# Patient Record
Sex: Female | Born: 1970 | Race: White | Hispanic: No | Smoking: Current every day smoker
Health system: Southern US, Community
[De-identification: ages and names within clinical notes are randomized; demographics above are authoritative.]

## PROBLEM LIST (undated history)

## (undated) DIAGNOSIS — D638 Anemia in other chronic diseases classified elsewhere: Secondary | ICD-10-CM

## (undated) DIAGNOSIS — K589 Irritable bowel syndrome without diarrhea: Secondary | ICD-10-CM

## (undated) DIAGNOSIS — IMO0002 Reserved for concepts with insufficient information to code with codable children: Secondary | ICD-10-CM

## (undated) DIAGNOSIS — F329 Major depressive disorder, single episode, unspecified: Secondary | ICD-10-CM

## (undated) DIAGNOSIS — B379 Candidiasis, unspecified: Secondary | ICD-10-CM

## (undated) DIAGNOSIS — F1021 Alcohol dependence, in remission: Secondary | ICD-10-CM

## (undated) DIAGNOSIS — M549 Dorsalgia, unspecified: Secondary | ICD-10-CM

## (undated) DIAGNOSIS — F952 Tourette's disorder: Secondary | ICD-10-CM

## (undated) DIAGNOSIS — Z9189 Other specified personal risk factors, not elsewhere classified: Secondary | ICD-10-CM

## (undated) DIAGNOSIS — F3289 Other specified depressive episodes: Secondary | ICD-10-CM

## (undated) DIAGNOSIS — G4700 Insomnia, unspecified: Secondary | ICD-10-CM

## (undated) DIAGNOSIS — J13 Pneumonia due to Streptococcus pneumoniae: Secondary | ICD-10-CM

## (undated) DIAGNOSIS — N289 Disorder of kidney and ureter, unspecified: Secondary | ICD-10-CM

## (undated) DIAGNOSIS — K219 Gastro-esophageal reflux disease without esophagitis: Secondary | ICD-10-CM

## (undated) DIAGNOSIS — K648 Other hemorrhoids: Secondary | ICD-10-CM

## (undated) DIAGNOSIS — E8941 Symptomatic postprocedural ovarian failure: Secondary | ICD-10-CM

## (undated) DIAGNOSIS — N200 Calculus of kidney: Secondary | ICD-10-CM

## (undated) DIAGNOSIS — Z8614 Personal history of Methicillin resistant Staphylococcus aureus infection: Secondary | ICD-10-CM

## (undated) DIAGNOSIS — H548 Legal blindness, as defined in USA: Secondary | ICD-10-CM

## (undated) DIAGNOSIS — Z9889 Other specified postprocedural states: Secondary | ICD-10-CM

## (undated) DIAGNOSIS — J449 Chronic obstructive pulmonary disease, unspecified: Secondary | ICD-10-CM

## (undated) DIAGNOSIS — K279 Peptic ulcer, site unspecified, unspecified as acute or chronic, without hemorrhage or perforation: Secondary | ICD-10-CM

## (undated) DIAGNOSIS — I2510 Atherosclerotic heart disease of native coronary artery without angina pectoris: Secondary | ICD-10-CM

## (undated) DIAGNOSIS — N23 Unspecified renal colic: Secondary | ICD-10-CM

## (undated) HISTORY — DX: Insomnia, unspecified: G47.00

## (undated) HISTORY — DX: Calculus of kidney: N20.0

## (undated) HISTORY — DX: Alcohol dependence, in remission: F10.21

## (undated) HISTORY — DX: Legal blindness, as defined in USA: H54.8

## (undated) HISTORY — PX: OTHER SURGICAL HISTORY: SHX169

## (undated) HISTORY — DX: Pneumonia due to Streptococcus pneumoniae: J13

## (undated) HISTORY — DX: Irritable bowel syndrome, unspecified: K58.9

## (undated) HISTORY — DX: Other hemorrhoids: K64.8

## (undated) HISTORY — DX: Symptomatic postprocedural ovarian failure: E89.41

## (undated) HISTORY — PX: ABDOMINAL HYSTERECTOMY: SHX81

## (undated) HISTORY — DX: Other specified postprocedural states: Z98.89

## (undated) HISTORY — DX: Reserved for concepts with insufficient information to code with codable children: IMO0002

## (undated) HISTORY — DX: Gastro-esophageal reflux disease without esophagitis: K21.9

## (undated) HISTORY — DX: Anemia in other chronic diseases classified elsewhere: D63.8

## (undated) HISTORY — DX: Other specified personal risk factors, not elsewhere classified: Z91.89

## (undated) HISTORY — DX: Personal history of Methicillin resistant Staphylococcus aureus infection: Z86.14

## (undated) HISTORY — DX: Dorsalgia, unspecified: M54.9

## (undated) HISTORY — DX: Major depressive disorder, single episode, unspecified: F32.9

## (undated) HISTORY — DX: Candidiasis, unspecified: B37.9

## (undated) HISTORY — DX: Tourette's disorder: F95.2

## (undated) HISTORY — DX: Other specified depressive episodes: F32.89

## (undated) HISTORY — DX: Peptic ulcer, site unspecified, unspecified as acute or chronic, without hemorrhage or perforation: K27.9

## (undated) HISTORY — DX: Unspecified renal colic: N23

## (undated) HISTORY — PX: EYE SURGERY: SHX253

---

## 1998-02-02 ENCOUNTER — Emergency Department (HOSPITAL_COMMUNITY): Admission: EM | Admit: 1998-02-02 | Discharge: 1998-02-02 | Payer: Self-pay | Admitting: Emergency Medicine

## 2001-06-03 ENCOUNTER — Emergency Department (HOSPITAL_COMMUNITY): Admission: EM | Admit: 2001-06-03 | Discharge: 2001-06-03 | Payer: Self-pay | Admitting: Emergency Medicine

## 2001-06-20 ENCOUNTER — Emergency Department (HOSPITAL_COMMUNITY): Admission: EM | Admit: 2001-06-20 | Discharge: 2001-06-20 | Payer: Self-pay | Admitting: Emergency Medicine

## 2001-11-27 ENCOUNTER — Emergency Department (HOSPITAL_COMMUNITY): Admission: EM | Admit: 2001-11-27 | Discharge: 2001-11-27 | Payer: Self-pay | Admitting: Emergency Medicine

## 2001-12-26 ENCOUNTER — Emergency Department (HOSPITAL_COMMUNITY): Admission: EM | Admit: 2001-12-26 | Discharge: 2001-12-26 | Payer: Self-pay | Admitting: Podiatry

## 2001-12-26 ENCOUNTER — Encounter: Payer: Self-pay | Admitting: *Deleted

## 2002-01-05 ENCOUNTER — Emergency Department (HOSPITAL_COMMUNITY): Admission: EM | Admit: 2002-01-05 | Discharge: 2002-01-05 | Payer: Self-pay | Admitting: Emergency Medicine

## 2002-01-12 ENCOUNTER — Emergency Department (HOSPITAL_COMMUNITY): Admission: EM | Admit: 2002-01-12 | Discharge: 2002-01-12 | Payer: Self-pay | Admitting: Emergency Medicine

## 2002-01-13 ENCOUNTER — Emergency Department (HOSPITAL_COMMUNITY): Admission: EM | Admit: 2002-01-13 | Discharge: 2002-01-13 | Payer: Self-pay | Admitting: Emergency Medicine

## 2002-01-24 ENCOUNTER — Emergency Department (HOSPITAL_COMMUNITY): Admission: EM | Admit: 2002-01-24 | Discharge: 2002-01-24 | Payer: Self-pay | Admitting: Emergency Medicine

## 2002-01-26 ENCOUNTER — Encounter: Payer: Self-pay | Admitting: Emergency Medicine

## 2002-01-27 ENCOUNTER — Emergency Department (HOSPITAL_COMMUNITY): Admission: EM | Admit: 2002-01-27 | Discharge: 2002-01-27 | Payer: Self-pay | Admitting: Emergency Medicine

## 2002-01-27 ENCOUNTER — Inpatient Hospital Stay (HOSPITAL_COMMUNITY): Admission: EM | Admit: 2002-01-27 | Discharge: 2002-01-30 | Payer: Self-pay | Admitting: Psychiatry

## 2002-01-28 ENCOUNTER — Encounter: Payer: Self-pay | Admitting: Emergency Medicine

## 2002-01-31 ENCOUNTER — Emergency Department (HOSPITAL_COMMUNITY): Admission: EM | Admit: 2002-01-31 | Discharge: 2002-01-31 | Payer: Self-pay | Admitting: Emergency Medicine

## 2002-02-25 ENCOUNTER — Emergency Department (HOSPITAL_COMMUNITY): Admission: EM | Admit: 2002-02-25 | Discharge: 2002-02-25 | Payer: Self-pay | Admitting: Emergency Medicine

## 2002-03-10 ENCOUNTER — Emergency Department (HOSPITAL_COMMUNITY): Admission: EM | Admit: 2002-03-10 | Discharge: 2002-03-10 | Payer: Self-pay | Admitting: Emergency Medicine

## 2002-03-23 ENCOUNTER — Emergency Department (HOSPITAL_COMMUNITY): Admission: EM | Admit: 2002-03-23 | Discharge: 2002-03-23 | Payer: Self-pay | Admitting: Emergency Medicine

## 2002-09-03 ENCOUNTER — Emergency Department (HOSPITAL_COMMUNITY): Admission: EM | Admit: 2002-09-03 | Discharge: 2002-09-03 | Payer: Self-pay | Admitting: Emergency Medicine

## 2002-09-04 ENCOUNTER — Emergency Department (HOSPITAL_COMMUNITY): Admission: EM | Admit: 2002-09-04 | Discharge: 2002-09-04 | Payer: Self-pay | Admitting: Emergency Medicine

## 2002-10-19 ENCOUNTER — Emergency Department (HOSPITAL_COMMUNITY): Admission: EM | Admit: 2002-10-19 | Discharge: 2002-10-19 | Payer: Self-pay | Admitting: Emergency Medicine

## 2002-10-20 ENCOUNTER — Emergency Department (HOSPITAL_COMMUNITY): Admission: EM | Admit: 2002-10-20 | Discharge: 2002-10-20 | Payer: Self-pay | Admitting: Emergency Medicine

## 2002-11-18 ENCOUNTER — Emergency Department (HOSPITAL_COMMUNITY): Admission: EM | Admit: 2002-11-18 | Discharge: 2002-11-18 | Payer: Self-pay | Admitting: Emergency Medicine

## 2002-12-09 ENCOUNTER — Emergency Department (HOSPITAL_COMMUNITY): Admission: EM | Admit: 2002-12-09 | Discharge: 2002-12-09 | Payer: Self-pay | Admitting: Emergency Medicine

## 2002-12-19 ENCOUNTER — Emergency Department (HOSPITAL_COMMUNITY): Admission: EM | Admit: 2002-12-19 | Discharge: 2002-12-19 | Payer: Self-pay | Admitting: Emergency Medicine

## 2003-02-16 ENCOUNTER — Emergency Department (HOSPITAL_COMMUNITY): Admission: EM | Admit: 2003-02-16 | Discharge: 2003-02-16 | Payer: Self-pay | Admitting: Emergency Medicine

## 2003-03-04 ENCOUNTER — Emergency Department (HOSPITAL_COMMUNITY): Admission: EM | Admit: 2003-03-04 | Discharge: 2003-03-05 | Payer: Self-pay | Admitting: Emergency Medicine

## 2003-03-07 ENCOUNTER — Emergency Department (HOSPITAL_COMMUNITY): Admission: EM | Admit: 2003-03-07 | Discharge: 2003-03-08 | Payer: Self-pay | Admitting: Emergency Medicine

## 2003-03-30 ENCOUNTER — Emergency Department (HOSPITAL_COMMUNITY): Admission: EM | Admit: 2003-03-30 | Discharge: 2003-03-30 | Payer: Self-pay | Admitting: Emergency Medicine

## 2003-04-18 ENCOUNTER — Emergency Department (HOSPITAL_COMMUNITY): Admission: EM | Admit: 2003-04-18 | Discharge: 2003-04-18 | Payer: Self-pay | Admitting: Emergency Medicine

## 2003-07-02 ENCOUNTER — Emergency Department (HOSPITAL_COMMUNITY): Admission: EM | Admit: 2003-07-02 | Discharge: 2003-07-02 | Payer: Self-pay | Admitting: Emergency Medicine

## 2004-02-18 ENCOUNTER — Emergency Department (HOSPITAL_COMMUNITY): Admission: EM | Admit: 2004-02-18 | Discharge: 2004-02-18 | Payer: Self-pay | Admitting: Emergency Medicine

## 2004-02-29 ENCOUNTER — Emergency Department (HOSPITAL_COMMUNITY): Admission: EM | Admit: 2004-02-29 | Discharge: 2004-02-29 | Payer: Self-pay | Admitting: Emergency Medicine

## 2004-03-13 ENCOUNTER — Emergency Department (HOSPITAL_COMMUNITY): Admission: EM | Admit: 2004-03-13 | Discharge: 2004-03-13 | Payer: Self-pay | Admitting: Emergency Medicine

## 2004-10-01 ENCOUNTER — Emergency Department (HOSPITAL_COMMUNITY): Admission: EM | Admit: 2004-10-01 | Discharge: 2004-10-01 | Payer: Self-pay | Admitting: *Deleted

## 2005-01-13 ENCOUNTER — Observation Stay (HOSPITAL_COMMUNITY): Admission: EM | Admit: 2005-01-13 | Discharge: 2005-01-13 | Payer: Self-pay | Admitting: Emergency Medicine

## 2005-01-16 ENCOUNTER — Emergency Department (HOSPITAL_COMMUNITY): Admission: EM | Admit: 2005-01-16 | Discharge: 2005-01-16 | Payer: Self-pay | Admitting: Emergency Medicine

## 2005-01-19 ENCOUNTER — Emergency Department (HOSPITAL_COMMUNITY): Admission: EM | Admit: 2005-01-19 | Discharge: 2005-01-19 | Payer: Self-pay | Admitting: Emergency Medicine

## 2005-01-25 ENCOUNTER — Ambulatory Visit (HOSPITAL_COMMUNITY): Admission: RE | Admit: 2005-01-25 | Discharge: 2005-01-25 | Payer: Self-pay | Admitting: Urology

## 2005-02-05 ENCOUNTER — Other Ambulatory Visit: Admission: RE | Admit: 2005-02-05 | Discharge: 2005-02-05 | Payer: Self-pay | Admitting: Obstetrics and Gynecology

## 2005-03-11 ENCOUNTER — Ambulatory Visit (HOSPITAL_COMMUNITY): Admission: EM | Admit: 2005-03-11 | Discharge: 2005-03-11 | Payer: Self-pay | Admitting: Emergency Medicine

## 2005-04-04 ENCOUNTER — Encounter: Payer: Self-pay | Admitting: Emergency Medicine

## 2005-04-05 ENCOUNTER — Inpatient Hospital Stay (HOSPITAL_COMMUNITY): Admission: EM | Admit: 2005-04-05 | Discharge: 2005-04-15 | Payer: Self-pay | Admitting: Internal Medicine

## 2005-04-26 ENCOUNTER — Ambulatory Visit: Payer: Self-pay | Admitting: Family Medicine

## 2005-05-16 ENCOUNTER — Ambulatory Visit: Payer: Self-pay | Admitting: Family Medicine

## 2005-05-18 ENCOUNTER — Ambulatory Visit (HOSPITAL_COMMUNITY): Admission: RE | Admit: 2005-05-18 | Discharge: 2005-05-18 | Payer: Self-pay | Admitting: *Deleted

## 2005-05-25 ENCOUNTER — Ambulatory Visit (HOSPITAL_COMMUNITY): Admission: RE | Admit: 2005-05-25 | Discharge: 2005-05-25 | Payer: Self-pay | Admitting: Urology

## 2005-06-20 ENCOUNTER — Ambulatory Visit: Payer: Self-pay | Admitting: Family Medicine

## 2005-06-23 ENCOUNTER — Inpatient Hospital Stay (HOSPITAL_COMMUNITY): Admission: AD | Admit: 2005-06-23 | Discharge: 2005-06-23 | Payer: Self-pay | Admitting: *Deleted

## 2005-06-23 ENCOUNTER — Ambulatory Visit: Payer: Self-pay | Admitting: Obstetrics and Gynecology

## 2005-07-06 ENCOUNTER — Ambulatory Visit (HOSPITAL_COMMUNITY): Admission: RE | Admit: 2005-07-06 | Discharge: 2005-07-06 | Payer: Self-pay | Admitting: Urology

## 2005-08-02 ENCOUNTER — Ambulatory Visit: Payer: Self-pay | Admitting: Gynecology

## 2005-08-09 ENCOUNTER — Ambulatory Visit (HOSPITAL_COMMUNITY): Admission: RE | Admit: 2005-08-09 | Discharge: 2005-08-09 | Payer: Self-pay | Admitting: *Deleted

## 2005-08-16 ENCOUNTER — Ambulatory Visit: Payer: Self-pay | Admitting: Gynecology

## 2005-08-17 ENCOUNTER — Ambulatory Visit (HOSPITAL_COMMUNITY): Admission: RE | Admit: 2005-08-17 | Discharge: 2005-08-17 | Payer: Self-pay | Admitting: Urology

## 2005-08-20 ENCOUNTER — Ambulatory Visit: Payer: Self-pay | Admitting: Gynecology

## 2005-08-20 ENCOUNTER — Inpatient Hospital Stay (HOSPITAL_COMMUNITY): Admission: AD | Admit: 2005-08-20 | Discharge: 2005-08-20 | Payer: Self-pay | Admitting: Gynecology

## 2005-08-30 ENCOUNTER — Ambulatory Visit: Payer: Self-pay | Admitting: Family Medicine

## 2005-09-06 ENCOUNTER — Ambulatory Visit: Payer: Self-pay | Admitting: Gynecology

## 2005-09-14 ENCOUNTER — Ambulatory Visit: Payer: Self-pay | Admitting: Obstetrics & Gynecology

## 2005-09-16 ENCOUNTER — Ambulatory Visit: Payer: Self-pay | Admitting: Gynecology

## 2005-09-16 ENCOUNTER — Encounter (INDEPENDENT_AMBULATORY_CARE_PROVIDER_SITE_OTHER): Payer: Self-pay | Admitting: *Deleted

## 2005-09-16 ENCOUNTER — Inpatient Hospital Stay (HOSPITAL_COMMUNITY): Admission: AD | Admit: 2005-09-16 | Discharge: 2005-09-18 | Payer: Self-pay | Admitting: Gynecology

## 2005-09-23 ENCOUNTER — Emergency Department (HOSPITAL_COMMUNITY): Admission: EM | Admit: 2005-09-23 | Discharge: 2005-09-24 | Payer: Self-pay | Admitting: Emergency Medicine

## 2005-10-08 ENCOUNTER — Inpatient Hospital Stay (HOSPITAL_COMMUNITY): Admission: EM | Admit: 2005-10-08 | Discharge: 2005-10-12 | Payer: Self-pay | Admitting: Emergency Medicine

## 2005-10-10 ENCOUNTER — Encounter: Payer: Self-pay | Admitting: Urology

## 2005-10-16 ENCOUNTER — Ambulatory Visit (HOSPITAL_COMMUNITY): Admission: RE | Admit: 2005-10-16 | Discharge: 2005-10-16 | Payer: Self-pay | Admitting: Urology

## 2005-11-01 ENCOUNTER — Observation Stay (HOSPITAL_COMMUNITY): Admission: EM | Admit: 2005-11-01 | Discharge: 2005-11-02 | Payer: Self-pay | Admitting: Emergency Medicine

## 2006-01-04 ENCOUNTER — Emergency Department (HOSPITAL_COMMUNITY): Admission: EM | Admit: 2006-01-04 | Discharge: 2006-01-04 | Payer: Self-pay | Admitting: Emergency Medicine

## 2006-01-09 ENCOUNTER — Ambulatory Visit (HOSPITAL_COMMUNITY): Admission: RE | Admit: 2006-01-09 | Discharge: 2006-01-09 | Payer: Self-pay | Admitting: Urology

## 2006-02-06 ENCOUNTER — Emergency Department (HOSPITAL_COMMUNITY): Admission: EM | Admit: 2006-02-06 | Discharge: 2006-02-06 | Payer: Self-pay | Admitting: Emergency Medicine

## 2006-02-18 ENCOUNTER — Emergency Department (HOSPITAL_COMMUNITY): Admission: EM | Admit: 2006-02-18 | Discharge: 2006-02-18 | Payer: Self-pay | Admitting: Emergency Medicine

## 2006-07-14 ENCOUNTER — Emergency Department (HOSPITAL_COMMUNITY): Admission: EM | Admit: 2006-07-14 | Discharge: 2006-07-14 | Payer: Self-pay | Admitting: Emergency Medicine

## 2006-07-20 ENCOUNTER — Emergency Department (HOSPITAL_COMMUNITY): Admission: EM | Admit: 2006-07-20 | Discharge: 2006-07-21 | Payer: Self-pay | Admitting: Emergency Medicine

## 2006-09-20 ENCOUNTER — Inpatient Hospital Stay (HOSPITAL_COMMUNITY): Admission: AD | Admit: 2006-09-20 | Discharge: 2006-09-20 | Payer: Self-pay | Admitting: *Deleted

## 2006-09-27 ENCOUNTER — Ambulatory Visit: Payer: Self-pay | Admitting: Gastroenterology

## 2006-10-09 ENCOUNTER — Ambulatory Visit: Payer: Self-pay | Admitting: Gastroenterology

## 2006-12-19 ENCOUNTER — Ambulatory Visit (HOSPITAL_COMMUNITY): Admission: RE | Admit: 2006-12-19 | Discharge: 2006-12-20 | Payer: Self-pay | Admitting: *Deleted

## 2006-12-19 ENCOUNTER — Encounter (INDEPENDENT_AMBULATORY_CARE_PROVIDER_SITE_OTHER): Payer: Self-pay | Admitting: *Deleted

## 2007-03-01 ENCOUNTER — Emergency Department (HOSPITAL_COMMUNITY): Admission: EM | Admit: 2007-03-01 | Discharge: 2007-03-02 | Payer: Self-pay | Admitting: Emergency Medicine

## 2007-04-08 ENCOUNTER — Emergency Department (HOSPITAL_COMMUNITY): Admission: EM | Admit: 2007-04-08 | Discharge: 2007-04-08 | Payer: Self-pay | Admitting: Emergency Medicine

## 2007-04-22 ENCOUNTER — Ambulatory Visit (HOSPITAL_COMMUNITY): Admission: RE | Admit: 2007-04-22 | Discharge: 2007-04-22 | Payer: Self-pay | Admitting: Urology

## 2007-04-26 ENCOUNTER — Emergency Department (HOSPITAL_COMMUNITY): Admission: EM | Admit: 2007-04-26 | Discharge: 2007-04-26 | Payer: Self-pay | Admitting: Emergency Medicine

## 2007-05-31 ENCOUNTER — Inpatient Hospital Stay (HOSPITAL_COMMUNITY): Admission: EM | Admit: 2007-05-31 | Discharge: 2007-06-08 | Payer: Self-pay | Admitting: Emergency Medicine

## 2007-05-31 ENCOUNTER — Ambulatory Visit: Payer: Self-pay | Admitting: Pulmonary Disease

## 2007-05-31 ENCOUNTER — Ambulatory Visit: Payer: Self-pay | Admitting: Cardiology

## 2007-06-05 ENCOUNTER — Encounter (INDEPENDENT_AMBULATORY_CARE_PROVIDER_SITE_OTHER): Payer: Self-pay | Admitting: Internal Medicine

## 2007-06-06 ENCOUNTER — Encounter: Payer: Self-pay | Admitting: Pulmonary Disease

## 2007-06-08 ENCOUNTER — Encounter: Payer: Self-pay | Admitting: Pulmonary Disease

## 2007-06-09 ENCOUNTER — Encounter: Payer: Self-pay | Admitting: Pulmonary Disease

## 2007-06-09 ENCOUNTER — Encounter (INDEPENDENT_AMBULATORY_CARE_PROVIDER_SITE_OTHER): Payer: Self-pay | Admitting: Internal Medicine

## 2007-06-12 ENCOUNTER — Telehealth: Payer: Self-pay | Admitting: Pulmonary Disease

## 2007-07-03 ENCOUNTER — Encounter (INDEPENDENT_AMBULATORY_CARE_PROVIDER_SITE_OTHER): Payer: Self-pay | Admitting: Nurse Practitioner

## 2007-07-03 LAB — CONVERTED CEMR LAB
BUN: 6 mg/dL
CO2: 27 meq/L
Calcium: 9.2 mg/dL
Creatinine, Ser: 0.6 mg/dL

## 2007-07-18 ENCOUNTER — Encounter (INDEPENDENT_AMBULATORY_CARE_PROVIDER_SITE_OTHER): Payer: Self-pay | Admitting: Nurse Practitioner

## 2007-07-18 DIAGNOSIS — Z9889 Other specified postprocedural states: Secondary | ICD-10-CM

## 2007-07-18 DIAGNOSIS — E8941 Symptomatic postprocedural ovarian failure: Secondary | ICD-10-CM | POA: Insufficient documentation

## 2007-07-18 DIAGNOSIS — G43909 Migraine, unspecified, not intractable, without status migrainosus: Secondary | ICD-10-CM | POA: Insufficient documentation

## 2007-07-18 DIAGNOSIS — N23 Unspecified renal colic: Secondary | ICD-10-CM

## 2007-07-18 DIAGNOSIS — F17200 Nicotine dependence, unspecified, uncomplicated: Secondary | ICD-10-CM | POA: Insufficient documentation

## 2007-07-18 DIAGNOSIS — F952 Tourette's disorder: Secondary | ICD-10-CM

## 2007-09-06 DIAGNOSIS — K589 Irritable bowel syndrome without diarrhea: Secondary | ICD-10-CM

## 2007-09-06 DIAGNOSIS — D638 Anemia in other chronic diseases classified elsewhere: Secondary | ICD-10-CM | POA: Insufficient documentation

## 2007-09-06 DIAGNOSIS — K648 Other hemorrhoids: Secondary | ICD-10-CM | POA: Insufficient documentation

## 2007-09-06 DIAGNOSIS — G4730 Sleep apnea, unspecified: Secondary | ICD-10-CM | POA: Insufficient documentation

## 2007-09-06 DIAGNOSIS — G4700 Insomnia, unspecified: Secondary | ICD-10-CM

## 2007-09-06 DIAGNOSIS — F1021 Alcohol dependence, in remission: Secondary | ICD-10-CM

## 2008-01-16 ENCOUNTER — Inpatient Hospital Stay (HOSPITAL_COMMUNITY): Admission: EM | Admit: 2008-01-16 | Discharge: 2008-01-23 | Payer: Self-pay | Admitting: Emergency Medicine

## 2008-02-25 ENCOUNTER — Ambulatory Visit (HOSPITAL_COMMUNITY): Admission: RE | Admit: 2008-02-25 | Discharge: 2008-02-25 | Payer: Self-pay | Admitting: Urology

## 2008-07-15 ENCOUNTER — Emergency Department (HOSPITAL_COMMUNITY): Admission: RE | Admit: 2008-07-15 | Discharge: 2008-07-15 | Payer: Self-pay | Admitting: Urology

## 2008-10-07 ENCOUNTER — Emergency Department (HOSPITAL_COMMUNITY): Admission: EM | Admit: 2008-10-07 | Discharge: 2008-10-07 | Payer: Self-pay | Admitting: Emergency Medicine

## 2008-10-14 ENCOUNTER — Ambulatory Visit (HOSPITAL_COMMUNITY): Admission: RE | Admit: 2008-10-14 | Discharge: 2008-10-14 | Payer: Self-pay | Admitting: Urology

## 2008-10-14 ENCOUNTER — Encounter (INDEPENDENT_AMBULATORY_CARE_PROVIDER_SITE_OTHER): Payer: Self-pay | Admitting: Urology

## 2008-11-05 ENCOUNTER — Emergency Department (HOSPITAL_COMMUNITY): Admission: EM | Admit: 2008-11-05 | Discharge: 2008-11-05 | Payer: Self-pay | Admitting: Emergency Medicine

## 2008-11-09 ENCOUNTER — Emergency Department (HOSPITAL_COMMUNITY): Admission: EM | Admit: 2008-11-09 | Discharge: 2008-11-09 | Payer: Self-pay | Admitting: Emergency Medicine

## 2008-12-01 ENCOUNTER — Inpatient Hospital Stay (HOSPITAL_COMMUNITY): Admission: RE | Admit: 2008-12-01 | Discharge: 2008-12-03 | Payer: Self-pay | Admitting: Urology

## 2008-12-01 ENCOUNTER — Encounter (INDEPENDENT_AMBULATORY_CARE_PROVIDER_SITE_OTHER): Payer: Self-pay | Admitting: Urology

## 2008-12-08 ENCOUNTER — Emergency Department (HOSPITAL_COMMUNITY): Admission: EM | Admit: 2008-12-08 | Discharge: 2008-12-08 | Payer: Self-pay | Admitting: Emergency Medicine

## 2008-12-09 ENCOUNTER — Inpatient Hospital Stay (HOSPITAL_COMMUNITY): Admission: EM | Admit: 2008-12-09 | Discharge: 2008-12-11 | Payer: Self-pay | Admitting: Emergency Medicine

## 2009-01-02 ENCOUNTER — Emergency Department (HOSPITAL_COMMUNITY): Admission: EM | Admit: 2009-01-02 | Discharge: 2009-01-02 | Payer: Self-pay | Admitting: Emergency Medicine

## 2009-01-07 ENCOUNTER — Ambulatory Visit: Payer: Self-pay | Admitting: Nurse Practitioner

## 2009-01-07 DIAGNOSIS — K219 Gastro-esophageal reflux disease without esophagitis: Secondary | ICD-10-CM

## 2009-01-07 DIAGNOSIS — K5900 Constipation, unspecified: Secondary | ICD-10-CM | POA: Insufficient documentation

## 2009-01-07 DIAGNOSIS — Z9189 Other specified personal risk factors, not elsewhere classified: Secondary | ICD-10-CM | POA: Insufficient documentation

## 2009-01-07 DIAGNOSIS — Z8614 Personal history of Methicillin resistant Staphylococcus aureus infection: Secondary | ICD-10-CM

## 2009-01-07 DIAGNOSIS — IMO0002 Reserved for concepts with insufficient information to code with codable children: Secondary | ICD-10-CM

## 2009-01-11 ENCOUNTER — Encounter (INDEPENDENT_AMBULATORY_CARE_PROVIDER_SITE_OTHER): Payer: Self-pay | Admitting: Nurse Practitioner

## 2009-01-12 ENCOUNTER — Encounter (INDEPENDENT_AMBULATORY_CARE_PROVIDER_SITE_OTHER): Payer: Self-pay | Admitting: Nurse Practitioner

## 2009-01-13 ENCOUNTER — Encounter (INDEPENDENT_AMBULATORY_CARE_PROVIDER_SITE_OTHER): Payer: Self-pay | Admitting: Nurse Practitioner

## 2009-01-19 ENCOUNTER — Encounter (INDEPENDENT_AMBULATORY_CARE_PROVIDER_SITE_OTHER): Payer: Self-pay | Admitting: Nurse Practitioner

## 2009-02-06 ENCOUNTER — Emergency Department (HOSPITAL_COMMUNITY): Admission: EM | Admit: 2009-02-06 | Discharge: 2009-02-06 | Payer: Self-pay | Admitting: Emergency Medicine

## 2009-02-10 ENCOUNTER — Ambulatory Visit: Payer: Self-pay | Admitting: Nurse Practitioner

## 2009-02-10 DIAGNOSIS — R635 Abnormal weight gain: Secondary | ICD-10-CM | POA: Insufficient documentation

## 2009-02-14 ENCOUNTER — Encounter (INDEPENDENT_AMBULATORY_CARE_PROVIDER_SITE_OTHER): Payer: Self-pay | Admitting: Nurse Practitioner

## 2009-02-16 DIAGNOSIS — D649 Anemia, unspecified: Secondary | ICD-10-CM

## 2009-02-16 LAB — CONVERTED CEMR LAB
Basophils Absolute: 0 10*3/uL (ref 0.0–0.1)
Hemoglobin: 10.5 g/dL — ABNORMAL LOW (ref 12.0–15.0)
Lymphocytes Relative: 18 % (ref 12–46)
Monocytes Absolute: 0.6 10*3/uL (ref 0.1–1.0)
Neutro Abs: 7.4 10*3/uL (ref 1.7–7.7)
Neutrophils Relative %: 72 % (ref 43–77)
RBC: 3.2 M/uL — ABNORMAL LOW (ref 3.87–5.11)
RDW: 13.4 % (ref 11.5–15.5)
Retic Ct Pct: 1 % (ref 0.4–3.1)

## 2009-02-17 ENCOUNTER — Ambulatory Visit: Payer: Self-pay | Admitting: Nurse Practitioner

## 2009-02-17 ENCOUNTER — Encounter (INDEPENDENT_AMBULATORY_CARE_PROVIDER_SITE_OTHER): Payer: Self-pay | Admitting: Nurse Practitioner

## 2009-02-17 DIAGNOSIS — F329 Major depressive disorder, single episode, unspecified: Secondary | ICD-10-CM

## 2009-02-17 DIAGNOSIS — M549 Dorsalgia, unspecified: Secondary | ICD-10-CM | POA: Insufficient documentation

## 2009-02-17 DIAGNOSIS — H548 Legal blindness, as defined in USA: Secondary | ICD-10-CM

## 2009-02-17 LAB — CONVERTED CEMR LAB
Blood in Urine, dipstick: NEGATIVE
Cortisol, Plasma: 4 ug/dL
GC Probe Amp, Genital: NEGATIVE
Glucose, Urine, Semiquant: NEGATIVE
HDL: 75 mg/dL (ref >39)
LDL Cholesterol: 107 mg/dL — ABNORMAL HIGH (ref 0–99)
Nitrite: NEGATIVE
Specific Gravity, Urine: 1.01
Total CHOL/HDL Ratio: 2.6
VLDL: 14 mg/dL (ref 0–40)
WBC Urine, dipstick: NEGATIVE
pH: 6

## 2009-02-22 ENCOUNTER — Encounter (INDEPENDENT_AMBULATORY_CARE_PROVIDER_SITE_OTHER): Payer: Self-pay | Admitting: Nurse Practitioner

## 2009-04-01 ENCOUNTER — Other Ambulatory Visit: Payer: Self-pay | Admitting: Emergency Medicine

## 2009-04-01 ENCOUNTER — Inpatient Hospital Stay (HOSPITAL_COMMUNITY): Admission: RE | Admit: 2009-04-01 | Discharge: 2009-04-06 | Payer: Self-pay | Admitting: Psychiatry

## 2009-04-01 ENCOUNTER — Ambulatory Visit: Payer: Self-pay | Admitting: Psychiatry

## 2009-04-05 ENCOUNTER — Encounter: Payer: Self-pay | Admitting: Emergency Medicine

## 2009-04-10 ENCOUNTER — Encounter (INDEPENDENT_AMBULATORY_CARE_PROVIDER_SITE_OTHER): Payer: Self-pay | Admitting: Nurse Practitioner

## 2009-04-14 ENCOUNTER — Ambulatory Visit: Payer: Self-pay | Admitting: Nurse Practitioner

## 2009-05-13 ENCOUNTER — Telehealth (INDEPENDENT_AMBULATORY_CARE_PROVIDER_SITE_OTHER): Payer: Self-pay | Admitting: *Deleted

## 2009-06-23 ENCOUNTER — Ambulatory Visit: Payer: Self-pay | Admitting: Nurse Practitioner

## 2009-06-27 ENCOUNTER — Telehealth (INDEPENDENT_AMBULATORY_CARE_PROVIDER_SITE_OTHER): Payer: Self-pay | Admitting: Nurse Practitioner

## 2009-07-01 ENCOUNTER — Telehealth (INDEPENDENT_AMBULATORY_CARE_PROVIDER_SITE_OTHER): Payer: Self-pay | Admitting: Nurse Practitioner

## 2009-07-15 ENCOUNTER — Encounter: Payer: Self-pay | Admitting: Urology

## 2009-07-15 ENCOUNTER — Emergency Department (HOSPITAL_COMMUNITY): Admission: EM | Admit: 2009-07-15 | Discharge: 2009-07-15 | Payer: Self-pay | Admitting: Emergency Medicine

## 2009-07-20 ENCOUNTER — Telehealth (INDEPENDENT_AMBULATORY_CARE_PROVIDER_SITE_OTHER): Payer: Self-pay | Admitting: Nurse Practitioner

## 2009-08-01 ENCOUNTER — Telehealth (INDEPENDENT_AMBULATORY_CARE_PROVIDER_SITE_OTHER): Payer: Self-pay | Admitting: Nurse Practitioner

## 2009-08-09 ENCOUNTER — Encounter (INDEPENDENT_AMBULATORY_CARE_PROVIDER_SITE_OTHER): Payer: Self-pay | Admitting: Nurse Practitioner

## 2009-08-19 ENCOUNTER — Encounter (INDEPENDENT_AMBULATORY_CARE_PROVIDER_SITE_OTHER): Payer: Self-pay | Admitting: Nurse Practitioner

## 2009-10-17 ENCOUNTER — Ambulatory Visit: Payer: Self-pay | Admitting: Nurse Practitioner

## 2009-10-17 LAB — CONVERTED CEMR LAB
Bilirubin Urine: NEGATIVE
Glucose, Urine, Semiquant: NEGATIVE
Ketones, urine, test strip: NEGATIVE
Nitrite: POSITIVE
Protein, U semiquant: NEGATIVE
Specific Gravity, Urine: 1.015
Urobilinogen, UA: 0.2
pH: 6.5

## 2009-10-18 ENCOUNTER — Encounter (INDEPENDENT_AMBULATORY_CARE_PROVIDER_SITE_OTHER): Payer: Self-pay | Admitting: Nurse Practitioner

## 2009-10-20 ENCOUNTER — Telehealth (INDEPENDENT_AMBULATORY_CARE_PROVIDER_SITE_OTHER): Payer: Self-pay | Admitting: Nurse Practitioner

## 2009-10-25 ENCOUNTER — Emergency Department (HOSPITAL_COMMUNITY): Admission: EM | Admit: 2009-10-25 | Discharge: 2009-10-25 | Payer: Self-pay | Admitting: Emergency Medicine

## 2009-10-26 ENCOUNTER — Telehealth (INDEPENDENT_AMBULATORY_CARE_PROVIDER_SITE_OTHER): Payer: Self-pay | Admitting: Nurse Practitioner

## 2009-11-16 ENCOUNTER — Telehealth (INDEPENDENT_AMBULATORY_CARE_PROVIDER_SITE_OTHER): Payer: Self-pay | Admitting: Nurse Practitioner

## 2009-12-27 ENCOUNTER — Emergency Department (HOSPITAL_COMMUNITY): Admission: EM | Admit: 2009-12-27 | Discharge: 2009-12-27 | Payer: Self-pay | Admitting: Emergency Medicine

## 2009-12-27 ENCOUNTER — Encounter
Admission: RE | Admit: 2009-12-27 | Discharge: 2010-03-27 | Payer: Self-pay | Source: Home / Self Care | Admitting: Physical Medicine and Rehabilitation

## 2009-12-29 ENCOUNTER — Emergency Department (HOSPITAL_COMMUNITY): Admission: EM | Admit: 2009-12-29 | Discharge: 2009-12-29 | Payer: Self-pay | Admitting: Emergency Medicine

## 2009-12-30 ENCOUNTER — Emergency Department (HOSPITAL_COMMUNITY): Admission: EM | Admit: 2009-12-30 | Discharge: 2009-12-30 | Payer: Self-pay | Admitting: Emergency Medicine

## 2010-01-02 ENCOUNTER — Ambulatory Visit: Payer: Self-pay | Admitting: Physical Medicine and Rehabilitation

## 2010-01-06 ENCOUNTER — Encounter (INDEPENDENT_AMBULATORY_CARE_PROVIDER_SITE_OTHER): Payer: Self-pay | Admitting: Nurse Practitioner

## 2010-01-09 ENCOUNTER — Emergency Department (HOSPITAL_COMMUNITY): Admission: EM | Admit: 2010-01-09 | Discharge: 2010-01-09 | Payer: Self-pay | Admitting: Emergency Medicine

## 2010-01-23 ENCOUNTER — Telehealth (INDEPENDENT_AMBULATORY_CARE_PROVIDER_SITE_OTHER): Payer: Self-pay | Admitting: Nurse Practitioner

## 2010-02-02 ENCOUNTER — Ambulatory Visit: Payer: Self-pay | Admitting: Nurse Practitioner

## 2010-02-08 ENCOUNTER — Telehealth (INDEPENDENT_AMBULATORY_CARE_PROVIDER_SITE_OTHER): Payer: Self-pay | Admitting: Nurse Practitioner

## 2010-03-27 ENCOUNTER — Ambulatory Visit: Payer: Self-pay | Admitting: Nurse Practitioner

## 2010-03-27 DIAGNOSIS — N898 Other specified noninflammatory disorders of vagina: Secondary | ICD-10-CM | POA: Insufficient documentation

## 2010-03-27 LAB — CONVERTED CEMR LAB
Bilirubin Urine: NEGATIVE
Glucose, Urine, Semiquant: NEGATIVE
KOH Prep: NEGATIVE
Ketones, urine, test strip: NEGATIVE
Protein, U semiquant: NEGATIVE
Urobilinogen, UA: 0.2

## 2010-04-03 ENCOUNTER — Encounter (INDEPENDENT_AMBULATORY_CARE_PROVIDER_SITE_OTHER): Payer: Self-pay | Admitting: Nurse Practitioner

## 2010-05-05 ENCOUNTER — Telehealth (INDEPENDENT_AMBULATORY_CARE_PROVIDER_SITE_OTHER): Payer: Self-pay | Admitting: Nurse Practitioner

## 2010-05-14 ENCOUNTER — Encounter: Payer: Self-pay | Admitting: *Deleted

## 2010-05-14 ENCOUNTER — Encounter: Payer: Self-pay | Admitting: Urology

## 2010-05-15 ENCOUNTER — Encounter: Payer: Self-pay | Admitting: Urology

## 2010-05-17 ENCOUNTER — Inpatient Hospital Stay (HOSPITAL_COMMUNITY)
Admission: EM | Admit: 2010-05-17 | Discharge: 2010-05-24 | DRG: 208 | Disposition: A | Discharge status: Home / Self Care | Payer: Medicaid Other | Attending: Internal Medicine | Admitting: Internal Medicine

## 2010-05-17 ENCOUNTER — Telehealth (INDEPENDENT_AMBULATORY_CARE_PROVIDER_SITE_OTHER): Payer: Self-pay | Admitting: Nurse Practitioner

## 2010-05-17 DIAGNOSIS — F05 Delirium due to known physiological condition: Secondary | ICD-10-CM | POA: Diagnosis present

## 2010-05-17 DIAGNOSIS — K219 Gastro-esophageal reflux disease without esophagitis: Secondary | ICD-10-CM | POA: Diagnosis present

## 2010-05-17 DIAGNOSIS — M5030 Other cervical disc degeneration, unspecified cervical region: Secondary | ICD-10-CM | POA: Diagnosis present

## 2010-05-17 DIAGNOSIS — F952 Tourette's disorder: Secondary | ICD-10-CM | POA: Diagnosis present

## 2010-05-17 DIAGNOSIS — G894 Chronic pain syndrome: Secondary | ICD-10-CM | POA: Diagnosis present

## 2010-05-17 DIAGNOSIS — F17200 Nicotine dependence, unspecified, uncomplicated: Secondary | ICD-10-CM | POA: Diagnosis present

## 2010-05-17 DIAGNOSIS — J189 Pneumonia, unspecified organism: Secondary | ICD-10-CM | POA: Diagnosis present

## 2010-05-17 DIAGNOSIS — F411 Generalized anxiety disorder: Secondary | ICD-10-CM | POA: Diagnosis present

## 2010-05-17 DIAGNOSIS — J9600 Acute respiratory failure, unspecified whether with hypoxia or hypercapnia: Principal | ICD-10-CM | POA: Diagnosis present

## 2010-05-17 DIAGNOSIS — J441 Chronic obstructive pulmonary disease with (acute) exacerbation: Secondary | ICD-10-CM | POA: Diagnosis present

## 2010-05-17 DIAGNOSIS — G9341 Metabolic encephalopathy: Secondary | ICD-10-CM | POA: Diagnosis present

## 2010-05-17 DIAGNOSIS — J810 Acute pulmonary edema: Secondary | ICD-10-CM | POA: Diagnosis present

## 2010-05-17 DIAGNOSIS — E876 Hypokalemia: Secondary | ICD-10-CM | POA: Diagnosis present

## 2010-05-17 LAB — CBC
Platelets: 281 10*3/uL (ref 150–400)
RDW: 15 % (ref 11.5–15.5)
WBC: 15.9 10*3/uL — ABNORMAL HIGH (ref 4.0–10.5)

## 2010-05-17 LAB — DIFFERENTIAL
Basophils Absolute: 0 10*3/uL (ref 0.0–0.1)
Eosinophils Absolute: 0.3 10*3/uL (ref 0.0–0.7)
Monocytes Absolute: 0.8 10*3/uL (ref 0.1–1.0)
Neutrophils Relative %: 85 % — ABNORMAL HIGH (ref 43–77)

## 2010-05-17 LAB — BLOOD GAS, ARTERIAL
Patient temperature: 98.6
pH, Arterial: 7.369 (ref 7.350–7.400)

## 2010-05-17 LAB — URINALYSIS, ROUTINE W REFLEX MICROSCOPIC
Ketones, ur: NEGATIVE mg/dL
Nitrite: NEGATIVE
pH: 6 (ref 5.0–8.0)

## 2010-05-17 LAB — BASIC METABOLIC PANEL
BUN: 15 mg/dL (ref 6–23)
GFR calc non Af Amer: >60 mL/min (ref >60)
Potassium: 3.7 mEq/L (ref 3.5–5.1)
Sodium: 135 mEq/L (ref 135–145)

## 2010-05-17 LAB — URINE MICROSCOPIC-ADD ON

## 2010-05-18 LAB — COMPREHENSIVE METABOLIC PANEL
ALT: 14 U/L (ref 0–35)
AST: 36 U/L (ref 0–37)
Alkaline Phosphatase: 80 U/L (ref 39–117)
CO2: 21 mEq/L (ref 19–32)
Calcium: 8.2 mg/dL — ABNORMAL LOW (ref 8.4–10.5)
GFR calc Af Amer: >60 mL/min (ref >60)
GFR calc non Af Amer: >60 mL/min (ref >60)
Glucose, Bld: 123 mg/dL — ABNORMAL HIGH (ref 70–99)
Potassium: 3.8 mEq/L (ref 3.5–5.1)
Sodium: 138 mEq/L (ref 135–145)

## 2010-05-18 LAB — BLOOD GAS, ARTERIAL
Acid-base deficit: 4.4 mmol/L — ABNORMAL HIGH (ref 0.0–2.0)
Drawn by: 229971
O2 Content: 8 L/min
O2 Saturation: 89.7 %
TCO2: 19.4 mmol/L (ref 0–100)
pCO2 arterial: 41.2 mmHg (ref 35.0–45.0)
pO2, Arterial: 59.2 mmHg — ABNORMAL LOW (ref 80.0–100.0)

## 2010-05-18 LAB — CBC
HCT: 32.4 % — ABNORMAL LOW (ref 36.0–46.0)
Hemoglobin: 10.7 g/dL — ABNORMAL LOW (ref 12.0–15.0)
RBC: 3.49 MIL/uL — ABNORMAL LOW (ref 3.87–5.11)
WBC: 11.6 10*3/uL — ABNORMAL HIGH (ref 4.0–10.5)

## 2010-05-18 LAB — MRSA PCR SCREENING: MRSA by PCR: NEGATIVE

## 2010-05-19 LAB — BLOOD GAS, ARTERIAL
Acid-base deficit: 2.6 mmol/L — ABNORMAL HIGH (ref 0.0–2.0)
Acid-base deficit: 3.2 mmol/L — ABNORMAL HIGH (ref 0.0–2.0)
Acid-base deficit: 3.3 mmol/L — ABNORMAL HIGH (ref 0.0–2.0)
Bicarbonate: 22.1 mEq/L (ref 20.0–24.0)
Bicarbonate: 22.9 mEq/L (ref 20.0–24.0)
Bicarbonate: 23 mEq/L (ref 20.0–24.0)
Delivery systems: POSITIVE
Drawn by: 340271
Drawn by: 235321
Expiratory PAP: 6
FIO2: 1 %
FIO2: 0.6 %
FIO2: 0.6 %
FIO2: 0.6 %
Inspiratory PAP: 14
MECHVT: 370 mL
MECHVT: 310 mL
MECHVT: 310 mL
MECHVT: 0.31 mL
MECHVT: 0.37 mL
O2 Saturation: 99.8 %
O2 Saturation: 97.6 %
O2 Saturation: 98.5 %
O2 Saturation: 98.9 %
PEEP: 10 cmH2O
PEEP: 8 cmH2O
Patient temperature: 98.6
Patient temperature: 99.3
Patient temperature: 100.5
Patient temperature: 100.5
Patient temperature: 100.5
Patient temperature: 100.5
RATE: 35 resp/min
TCO2: 22.1 mmol/L (ref 0–100)
TCO2: 22.1 mmol/L (ref 0–100)
TCO2: 21.9 mmol/L (ref 0–100)
pCO2 arterial: 47.1 mmHg — ABNORMAL HIGH (ref 35.0–45.0)
pCO2 arterial: 55.4 mmHg — ABNORMAL HIGH (ref 35.0–45.0)
pCO2 arterial: 45.4 mmHg — ABNORMAL HIGH (ref 35.0–45.0)
pH, Arterial: 7.334 — ABNORMAL LOW (ref 7.350–7.400)
pH, Arterial: 7.316 — ABNORMAL LOW (ref 7.350–7.400)
pH, Arterial: 7.244 — ABNORMAL LOW (ref 7.350–7.400)
pH, Arterial: 7.327 — ABNORMAL LOW (ref 7.350–7.400)

## 2010-05-19 LAB — BASIC METABOLIC PANEL
Calcium: 8.8 mg/dL (ref 8.4–10.5)
GFR calc Af Amer: >60 mL/min (ref >60)
GFR calc non Af Amer: >60 mL/min (ref >60)
Glucose, Bld: 147 mg/dL — ABNORMAL HIGH (ref 70–99)
Potassium: 3.6 mEq/L (ref 3.5–5.1)
Sodium: 137 mEq/L (ref 135–145)

## 2010-05-19 LAB — CBC
HCT: 31.2 % — ABNORMAL LOW (ref 36.0–46.0)
MCHC: 33 g/dL (ref 30.0–36.0)
Platelets: 285 10*3/uL (ref 150–400)
RDW: 14.9 % (ref 11.5–15.5)
WBC: 13.7 10*3/uL — ABNORMAL HIGH (ref 4.0–10.5)

## 2010-05-19 LAB — DIFFERENTIAL
Basophils Absolute: 0 10*3/uL (ref 0.0–0.1)
Basophils Relative: 0 % (ref 0–1)
Eosinophils Absolute: 0 10*3/uL (ref 0.0–0.7)
Eosinophils Relative: 0 % (ref 0–5)
Monocytes Absolute: 0.8 10*3/uL (ref 0.1–1.0)

## 2010-05-19 LAB — PROCALCITONIN: Procalcitonin: <0.1 ng/mL

## 2010-05-19 LAB — GLUCOSE, CAPILLARY
Glucose-Capillary: 145 mg/dL — ABNORMAL HIGH (ref 70–99)
Glucose-Capillary: 150 mg/dL — ABNORMAL HIGH (ref 70–99)

## 2010-05-19 LAB — HEPATIC FUNCTION PANEL
ALT: 31 U/L (ref 0–35)
AST: 30 U/L (ref 0–37)
Bilirubin, Direct: 0.1 mg/dL (ref 0.0–0.3)

## 2010-05-19 LAB — LACTIC ACID, PLASMA: Lactic Acid, Venous: 0.7 mmol/L (ref 0.5–2.2)

## 2010-05-19 LAB — MAGNESIUM: Magnesium: 1.9 mg/dL (ref 1.5–2.5)

## 2010-05-19 LAB — STREP PNEUMONIAE URINARY ANTIGEN: Strep Pneumo Urinary Antigen: NEGATIVE

## 2010-05-19 LAB — INFLUENZA PANEL BY PCR (TYPE A & B): Influenza B By PCR: NEGATIVE

## 2010-05-20 LAB — BLOOD GAS, ARTERIAL
Acid-base deficit: 0.9 mmol/L (ref 0.0–2.0)
Drawn by: 340271
FIO2: 0.3 %
MECHVT: 370 mL
O2 Saturation: 95.8 %
RATE: 35 resp/min
TCO2: 22.6 mmol/L (ref 0–100)
pCO2 arterial: 44.2 mmHg (ref 35.0–45.0)
pO2, Arterial: 74.5 mmHg — ABNORMAL LOW (ref 80.0–100.0)

## 2010-05-20 LAB — GLUCOSE, CAPILLARY
Glucose-Capillary: 143 mg/dL — ABNORMAL HIGH (ref 70–99)
Glucose-Capillary: 150 mg/dL — ABNORMAL HIGH (ref 70–99)

## 2010-05-20 LAB — DIFFERENTIAL
Basophils Relative: 0 % (ref 0–1)
Lymphs Abs: 0.5 10*3/uL — ABNORMAL LOW (ref 0.7–4.0)
Monocytes Absolute: 0.4 10*3/uL (ref 0.1–1.0)
Monocytes Relative: 4 % (ref 3–12)
Neutro Abs: 8.9 10*3/uL — ABNORMAL HIGH (ref 1.7–7.7)
Neutrophils Relative %: 91 % — ABNORMAL HIGH (ref 43–77)

## 2010-05-20 LAB — BASIC METABOLIC PANEL
BUN: 11 mg/dL (ref 6–23)
CO2: 26 mEq/L (ref 19–32)
Calcium: 8.2 mg/dL — ABNORMAL LOW (ref 8.4–10.5)
Chloride: 107 mEq/L (ref 96–112)
Creatinine, Ser: 0.68 mg/dL (ref 0.4–1.2)
Glucose, Bld: 143 mg/dL — ABNORMAL HIGH (ref 70–99)

## 2010-05-20 LAB — CBC
HCT: 28.1 % — ABNORMAL LOW (ref 36.0–46.0)
Hemoglobin: 9.2 g/dL — ABNORMAL LOW (ref 12.0–15.0)
MCH: 30.5 pg (ref 26.0–34.0)
MCHC: 32.7 g/dL (ref 30.0–36.0)
MCV: 93 fL (ref 78.0–100.0)
RBC: 3.02 MIL/uL — ABNORMAL LOW (ref 3.87–5.11)

## 2010-05-20 LAB — LACTIC ACID, PLASMA: Lactic Acid, Venous: 0.6 mmol/L (ref 0.5–2.2)

## 2010-05-21 LAB — GLUCOSE, CAPILLARY
Glucose-Capillary: 131 mg/dL — ABNORMAL HIGH (ref 70–99)
Glucose-Capillary: 121 mg/dL — ABNORMAL HIGH (ref 70–99)
Glucose-Capillary: 148 mg/dL — ABNORMAL HIGH (ref 70–99)

## 2010-05-21 LAB — CBC
HCT: 27.2 % — ABNORMAL LOW (ref 36.0–46.0)
Hemoglobin: 9.1 g/dL — ABNORMAL LOW (ref 12.0–15.0)
RBC: 2.95 MIL/uL — ABNORMAL LOW (ref 3.87–5.11)
WBC: 12.4 10*3/uL — ABNORMAL HIGH (ref 4.0–10.5)

## 2010-05-21 LAB — BASIC METABOLIC PANEL
BUN: 17 mg/dL (ref 6–23)
CO2: 25 mEq/L (ref 19–32)
CO2: 27 mEq/L (ref 19–32)
GFR calc Af Amer: >60 mL/min (ref >60)
GFR calc non Af Amer: >60 mL/min (ref >60)
GFR calc non Af Amer: >60 mL/min (ref >60)
Glucose, Bld: 127 mg/dL — ABNORMAL HIGH (ref 70–99)
Glucose, Bld: 181 mg/dL — ABNORMAL HIGH (ref 70–99)
Potassium: 3.8 mEq/L (ref 3.5–5.1)
Potassium: 3.2 mEq/L — ABNORMAL LOW (ref 3.5–5.1)
Sodium: 144 mEq/L (ref 135–145)
Sodium: 140 mEq/L (ref 135–145)

## 2010-05-21 LAB — CULTURE, BAL-QUANTITATIVE W GRAM STAIN

## 2010-05-21 LAB — LEGIONELLA ANTIGEN, URINE

## 2010-05-21 LAB — DIFFERENTIAL
Basophils Absolute: 0 10*3/uL (ref 0.0–0.1)
Lymphocytes Relative: 6 % — ABNORMAL LOW (ref 12–46)
Neutro Abs: 10.8 10*3/uL — ABNORMAL HIGH (ref 1.7–7.7)
Neutrophils Relative %: 88 % — ABNORMAL HIGH (ref 43–77)

## 2010-05-22 LAB — BASIC METABOLIC PANEL
BUN: 18 mg/dL (ref 6–23)
CO2: 33 mEq/L — ABNORMAL HIGH (ref 19–32)
Calcium: 8.6 mg/dL (ref 8.4–10.5)
Creatinine, Ser: 0.71 mg/dL (ref 0.4–1.2)
Glucose, Bld: 100 mg/dL — ABNORMAL HIGH (ref 70–99)

## 2010-05-22 LAB — GLUCOSE, CAPILLARY
Glucose-Capillary: 107 mg/dL — ABNORMAL HIGH (ref 70–99)
Glucose-Capillary: 108 mg/dL — ABNORMAL HIGH (ref 70–99)

## 2010-05-22 LAB — CBC
Hemoglobin: 9.7 g/dL — ABNORMAL LOW (ref 12.0–15.0)
MCHC: 33 g/dL (ref 30.0–36.0)
WBC: 10.9 10*3/uL — ABNORMAL HIGH (ref 4.0–10.5)

## 2010-05-22 LAB — VANCOMYCIN, TROUGH: Vancomycin Tr: 12.4 ug/mL (ref 10.0–20.0)

## 2010-05-23 NOTE — Progress Notes (Signed)
Summary: MISPLACED RX FOR YEAST INFECTION   Phone Note Call from Patient Call back at Home Phone [phone]   Reason for Call: Refill Medication Summary of Call: [name] MS Docken SAYS THAT SHE MISPLACED THE RX FOR THE YEAST INFECTION FROM [facility] , AND SHE WOULD LIKE FOR [place] TO [facility] @ [phone]. Initial call taken by: Leodis Rains,  October 26, 2009 11:44 AM  Follow-up for Phone Call        Spoke with Jesse Fall -- will refill x1.  Dutch Quint RN  October 26, 2009 2:13 PM

## 2010-05-23 NOTE — Assessment & Plan Note (Signed)
Summary: Skin lesions/Depression   Vital Signs:  Patient profile:   40 year old female Menstrual status:  hysterectomy 2008 Weight:      145.9 pounds BMI:     28.60 BSA:     1.63 Temp:     98.0 degrees F oral Pulse rate:   60 / minute Pulse rhythm:   regular Resp:     16 per minute BP sitting:   110 / 69  (left arm) Cuff size:   regular  Vitals Entered By: Levon Hedger (June 23, 2009 4:10 PM) CC: follow-up visit for chronic issues...had a mrsa breakout on back and under arm very painful and has an outbreak in vaginal area and vaginal odor, Depression Is Patient Diabetic? No Pain Assessment Patient in pain? yes       Does patient need assistance? Functional Status Self care Ambulation Normal   CC:  follow-up visit for chronic issues...had a mrsa breakout on back and under arm very painful and has an outbreak in vaginal area and vaginal odor and Depression.  History of Present Illness:  Pt into the office for follow up -   Crying during the exam.  Continuous with stress issues and depression.  Ortho - Pt was referred to ortho by this office however there has been a problem with them getting her records from a previous provider.  Tourette's - Pt was going to the [facility] but she has stopped going because she feels like is a "[place] Pig"  she was previously a ptat [facility] - Psychologist, occupational and she was pleased with her care while there.  After moving to [place] she had to stop going there and it has been a downhill sprial every since. Pt was taking orept, prozac and xanax while being seen at [facility] and reports this medications worked well for her.  Case Worker is Maryruth Hancock Pt has had multiple visits to [facility] ER for neck and back pain.    Depression History:      The patient is having a depressed mood most of the day and has a diminished interest in her usual daily activities.  The patient denies recurrent thoughts of death  or suicide.        The patient denies that she feels like life is not worth living, denies that she wishes that she were dead, and denies that she has thought about ending her life.        Comments:  Hx of suicide attempts but not current thoughts.   Habits & Providers  Alcohol-Tobacco-Diet     Alcohol drinks/day: <1     Alcohol Counseling: to decrease amount and/or frequency of alcohol intake     Alcohol type: wine     Tobacco Status: current     Tobacco Counseling: to quit use of tobacco products     Cigarette Packs/Day: 0.5     Year Started: age 40  Exercise-Depression-Behavior     Does Patient Exercise: yes     Exercise Counseling: to improve exercise regimen     Type of exercise: walking     Exercise (avg: min/session): <30     Times/week: 3     Have you felt down or hopeless? yes     Have you felt little pleasure in things? yes     Depression Counseling: further diagnostic testing and/or other treatment is indicated     Drug Use: past  Allergies (verified): 1)  ! Ketorolac Tromethamine (Ketorolac Tromethamine)  2)  ! Imitrex (Sumatriptan Succinate) 3)  ! Cipro (Ciprofloxacin Hcl) 4)  ! Valproic Acid (Valproic Acid) 5)  ! * Lithium  Review of Systems CV:  Denies chest pain or discomfort. Resp:  Denies cough. GI:  Denies abdominal pain, nausea, and vomiting. GU:  Complains of discharge. Derm:  Complains of lesion(s); One under her right axilla but multiple in her right and left buttocks.  Mulitple sites inflammed and difficulty sitting x 1 week ago at onset. Previous Rx for MRSA. She has tried to soak in the bathtub which did help some.Marland Kitchen Psych:  Complains of anxiety, easily angered, and irritability.  Physical Exam  General:  alert.   Head:  normocephalic.   Lungs:  normal breath sounds.   Heart:  normal rate and regular rhythm.   Msk:  up to the exam table Neurologic:  gait normal.   Skin:  right axilla, bil buttocks with raised, erythematous, papules, tender to  touch Psych:  crying during the exam   Impression & Recommendations:  Problem # 1:  SKIN LESION (ICD-709.9) will give keflex advised to pt practice good hygiene  Problem # 2:  DEPRESSION (ICD-311) will attempt to get pt re-established at [facility] Her updated medication list for this problem includes:    Cymbalta 30 Mg Cpep (Duloxetine hcl) ..... One tablet by mouth daily  Problem # 3:  TOURETTE'S DISORDER (ICD-307.23)  Complete Medication List: 1)  Qvar 40 Mcg/act Aers (Beclomethasone dipropionate) .... 2 puffs inhalation two times a day  **pharmacy - d/c advair** 2)  Proventil Hfa 108 (90 Base) Mcg/act Aers (Albuterol sulfate) .Marland Kitchen.. 1-2 inhalations every 6 hours as needed for shortness of breath 3)  Dulcolax Stool Softener 100 Mg Caps (Docusate sodium) .... One capsule by mouth three times a day 4)  Ibuprofen 200 Mg Tabs (Ibuprofen) .... One tablet by mouth as needed for pain 5)  Nexium 40 Mg Cpdr (Esomeprazole magnesium) .... One capsule by mouth daily 30 minutes before breakfast 6)  Polyethylene Glycol Powd (Polyethylene glycol 1450) .Marland Kitchen.. 1 capful mixed with 8 oz glass of water/juice daily 7)  Flexeril 10 Mg Tabs (Cyclobenzaprine hcl) .... One tablet by mouth daily as needed for spasms 8)  Nu-iron 150 Mg Caps (Polysaccharide iron complex) .... One tablet by mouth daily 9)  Meloxicam 15 Mg Tabs (Meloxicam) .... One tablet by mouth daily for neck/back 10)  Cymbalta 30 Mg Cpep (Duloxetine hcl) .... One tablet by mouth daily 11)  Lamotrigine 25 Mg Tabs (Lamotrigine) .... One tablet by mouth at bedtime 12)  Vitamin B-1 100 Mg Tabs (Thiamine hcl) .... One tablet by mouth 13)  Zolpidem Tartrate 10 Mg Tabs (Zolpidem tartrate) .... One tablet by mouth nightly as needed for rest 14)  Keflex 500 Mg Caps (Cephalexin) .... One tablet by mouth three times a day for infection 15)  Fluconazole 150 Mg Tabs (Fluconazole) .... One tablet x 1 dose  Patient Instructions: 1)  Call  orthopedic and see if you can get an appointment. 2)  This office will see if you can re-establish at [facility] 3)  Medications for your infection has been sent to the pharmacy. Prescriptions: FLUCONAZOLE 150 MG TABS (FLUCONAZOLE) One tablet x 1 dose  #1 x 0   Entered and Authorized by:   Lehman Prom FNP   Signed by:   Lehman Prom FNP on 06/23/2009   Method used:   [facility]  [address] [facility]  [address] [facility]       [address], [place]  [address]       Ph: [phone] or [phone]       Fax: [phone]   RxID:   [id number] KEFLEX 500 MG CAPS (CEPHALEXIN) One tablet by mouth three times a day for infection  #30 x 0   Entered and Authorized by:   Lehman Prom FNP   Signed by:   Lehman Prom FNP on 06/23/2009   Method used:   [facility]        [facility]  [address] [phone] ([facility]ail)       [address]       [place], [place]  [address]       Ph: [phone] or [phone]       Fax: [phone]   RxID:   [id number]

## 2010-05-23 NOTE — Progress Notes (Signed)
Summary: PAIN CLINIC REFERRAL   Phone Note Other Incoming   Summary of Call: [facility]  [address]  [address] [facility] # [phone] FAX [phone] THEY SEND A FAX. THE DR'S REVIEWED THE REFERRAL AND THEY FELT THAT THEY DON'T HAVE NOTHING TO OFFER TO THIS PATIENT AND THEY MADE THE DECISION THAT THEY WILL NO BE ABLE TO SEE HIM AS A PATIENT . THANK YOU FOR THE REFERRAL. Initial call taken by: Cheryll Dessert,  November 16, 2009 2:27 PM  Follow-up for Phone Call        I SPOKE TO PT YESTERDAY SHE SAID THAT I CAN REFER HER TO [facility] . I SEND THE REFERRAL TODAY AND JUST WAITING FOR AN APPT  Follow-up by: Cheryll Dessert,  November 17, 2009 12:24 PM  Additional Follow-up for Phone Call Additional follow up Details #1::        noted Additional Follow-up by: Lehman Prom FNP,  November 17, 2009 1:33 PM

## 2010-05-23 NOTE — Letter (Signed)
Summary: CASE MANAGEMENT PROGRESS SUMMARY  CASE MANAGEMENT PROGRESS SUMMARY   Imported By: Arta Bruce 08/10/2009 10:38:13  _____________________________________________________________________  External Attachment:    Type:   Image     Comment:   External Document

## 2010-05-23 NOTE — Progress Notes (Signed)
Summary: Appointment   Phone Note Outgoing Call   Summary of Call: attempted to call pt - when she returns the call notify pt that an appointment was made at [facility] on March 29th at 11:10AM with Dr. Raliegh Scarlet. Advise pt to be sure that she keeps this appt Initial call taken by: Lehman Prom FNP,  June 27, 2009 9:08 AM  Follow-up for Phone Call        Levon Hedger  June 28, 2009 10:55 AM Left message on machine for pt to return call to the office.  Additional Follow-up for Phone Call Additional follow up Details #1::        PATIENT RETURNED CALL AND SHE IS AWARE OF HER APPT AT [facility] IN [place]. Additional Follow-up by: Leodis Rains,  June 28, 2009 4:46 PM

## 2010-05-23 NOTE — Progress Notes (Signed)
Summary: Pain clinic referral   Phone Note Outgoing Call   Summary of Call: Refer pt to pain clinic Her 1st preference is [facility] Initial call taken by: Lehman Prom FNP,  October 20, 2009 8:14 AM

## 2010-05-23 NOTE — Letter (Signed)
Summary: [facility]//EMERGENCY DEPART.  [facility]//EMERGENCY DEPART.   Imported By: Arta Bruce 06/08/2009 12:33:52  _____________________________________________________________________  External Attachment:    Type:   Image     Comment:   External Document

## 2010-05-23 NOTE — Letter (Signed)
Summary: RECORDS FROM [facility]/HISTORICAL CHART  RECORDS FROM [facility]/HISTORICAL CHART   Imported By: Arta Bruce 01/12/2010 10:28:38  _____________________________________________________________________  External Attachment:    Type:   Image     Comment:   External Document

## 2010-05-23 NOTE — Progress Notes (Signed)
Summary: Query:  Refill Nexium?  Phone Note Outgoing Call   Summary of Call: Do you want to refill her nexium?  Last seen 06/2009. Initial call taken by: Dutch Quint RN,  January 23, 2010 12:34 PM  Follow-up for Phone Call        ok to refill Follow-up by: Lehman Prom FNP,  January 24, 2010 8:43 AM  Additional Follow-up for Phone Call Additional follow up Details #1::        Noted.  Rx refilled.  Dutch Quint RN  January 24, 2010 2:27 PM

## 2010-05-23 NOTE — Assessment & Plan Note (Signed)
Summary: Back pain   Vital Signs:  Patient profile:   40 year old female Menstrual status:  hysterectomy 2008 Height:      60 inches Weight:      155 pounds BMI:     30.38 Temp:     97.3 degrees F oral Pulse rate:   56 / minute Pulse rhythm:   regular Resp:     16 per minute BP sitting:   108 / 80  (right arm) Cuff size:   regular  Vitals Entered By: Arm[place] Shannon (February 02, 2010 12:32 PM)  Nutrition Counseling: Patient's BMI is greater than 25 and therefore counseled on weight management options. CC: pt is here because she is unable to sleep at night because of a cold...., Abdominal Pain, Depression, Back Pain Is Patient Diabetic? No Pain Assessment Patient in pain? no       Does patient need assistance? Functional Status Self care Ambulation Normal   CC:  pt is here because she is unable to sleep at night because of a cold...., Abdominal Pain, Depression, and Back Pain.  History of Present Illness:  Pt into the office for f/u. Pt has been to several specialist to which she is seeing. She also has been in and out of [facility]  Back pain - Pain is ongoing. Pt has established to the pain clinic. However, she has not been given any pain medications.  She has been to the ER several time since her last visit here. Pt reports that she is unable rest. Next appt is November 4th  Urology - Yearly.  Pt has ongoing problems with kidney.   Mental Health - Every 3 months.  Mental health medications are being ordered by that provider.     Back Pain History:      The patient's back pain has been present for > 6 weeks.  The pain is located in the lower back region and does not radiate below the knees.  She states that she has had a prior history of back pain.  The patient has not had any recent physical therapy for her back pain.   The patient denies that she feels like life is not worth living, denies that she wishes that she were dead, and denies that she has thought  about ending her life.         Dyspepsia History:      She has no alarm features of dyspepsia including no history of melena, hematochezia, dysphagia, persistent vomiting, or involuntary weight loss > 5%.  There is a prior history of GERD.  The patient does not have a prior history of documented ulcer disease.  The dominant symptom is not heartburn or acid reflux.  An H-2 blocker medication is not currently being taken.  She has no history of a positive H. Pylori serology.  No previous upper endoscopy has been done.      Current Medications (verified): 1)  Qvar 40 Mcg/act Aers (Beclomethasone Dipropionate) .... 2 Puffs Inhalation Two Times A Day  **pharmacy - D/c Advair** 2)  Proventil Hfa 108 (90 Base) Mcg/act Aers (Albuterol Sulfate) .Marland Kitchen.. 1-2 Inhalations Every 6 Hours As Needed For Shortness of Breath 3)  Dulcolax Stool Softener 100 Mg Caps (Docusate Sodium) .... One Capsule By Mouth Three Times A Day 4)  Ibuprofen 200 Mg Tabs (Ibuprofen) .... One Tablet By Mouth As Needed For Pain 5)  Nexium 40 Mg Cpdr (Esomeprazole Magnesium) .... One Capsule By Mouth Daily 30 Minutes Before Breakfast 6)  Polyethylene Glycol  Powd (Polyethylene Glycol 1450) .Marland Kitchen.. 1 Capful Mixed With 8 Oz Glass of Water/juice Daily 7)  Flexeril 10 Mg Tabs (Cyclobenzaprine Hcl) .... One Tablet By Mouth Daily As Needed For Spasms 8)  Nu-Iron 150 Mg Caps (Polysaccharide Iron Complex) .... One Tablet By Mouth Daily 9)  Meloxicam 15 Mg Tabs (Meloxicam) .... One Tablet By Mouth Daily For Neck/back 10)  Cymbalta 30 Mg Cpep (Duloxetine Hcl) .... One Tablet By Mouth Daily 11)  Lamotrigine 25 Mg Tabs (Lamotrigine) .... One Tablet By Mouth At Bedtime 12)  Vitamin B-1 100 Mg Tabs (Thiamine Hcl) .... One Tablet By Mouth 13)  Zolpidem Tartrate 10 Mg Tabs (Zolpidem Tartrate) .... One Tablet By Mouth Nightly As Needed For Rest 14)  Fluconazole 150 Mg Tabs (Fluconazole) .... One Tablet X 1 Dose 15)  Bactrim Ds 800-160 Mg Tabs  (Sulfamethoxazole-Trimethoprim) .... One Tablet By Mouth Two Times A Day For Infection  Allergies (verified): 1)  ! Ketorolac Tromethamine (Ketorolac Tromethamine) 2)  ! Imitrex (Sumatriptan Succinate) 3)  ! Cipro (Ciprofloxacin Hcl) 4)  ! Valproic Acid (Valproic Acid) 5)  ! * Lithium  Review of Systems General:  Denies fever. CV:  Denies chest pain or discomfort. Resp:  Denies shortness of breath. GI:  Denies abdominal pain, nausea, and vomiting. MS:  Complains of low back pain.  Physical Exam  General:  alert.   Head:  normocephalic.   Lungs:  normal breath sounds.   Heart:  normal rate and regular rhythm.   Abdomen:  normal bowel sounds.   Neurologic:  alert & oriented X3.   Skin:  color normal.   Psych:  Oriented X3.     Impression & Recommendations:  Problem # 1:  BACK PAIN (ICD-724.5) will give depomedrol and toradol IM in the office. will change to flexeril and skelaxin  will also start neurontin The following medications were removed from the medication list:    Flexeril 10 Mg Tabs (Cyclobenzaprine hcl) ..... One tablet by mouth daily as needed for spasms    Meloxicam 15 Mg Tabs (Meloxicam) ..... One tablet by mouth daily for neck/back Her updated medication list for this problem includes:    Ibuprofen 200 Mg Tabs (Ibuprofen) ..... One tablet by mouth as needed for pain    Metaxalone 800 Mg Tabs (Metaxalone) ..... One tablet by mouth daily as needed for spasms  Orders: Admin of Therapeutic Inj  intramuscular or subcutaneous (78295) Ketorolac-Toradol 15mg  (A2130) Depo- Medrol 80mg  (J1040)  Problem # 2:  DEPRESSION (ICD-311) advsied pt to continue f/u at mental health Her updated medication list for this problem includes:    Cymbalta 30 Mg Cpep (Duloxetine hcl) ..... One tablet by mouth daily  Problem # 3:  TOBACCO ABUSE (ICD-305.1) advised cessation  Problem # 4:  NEED PROPHYLACTIC VACCINATION&INOCULATION FLU (ICD-V04.81)  advise  cessation  Orders: Flu Vaccine 59yrs + (86578) Admin 1st Vaccine (46962)  Complete Medication List: 1)  Qvar 40 Mcg/act Aers (Beclomethasone dipropionate) .... 2 puffs inhalation two times a day  **pharmacy - d/c advair** 2)  Proventil Hfa 108 (90 Base) Mcg/act Aers (Albuterol sulfate) .Marland Kitchen.. 1-2 inhalations every 6 hours as needed for shortness of breath 3)  Dulcolax Stool Softener 100 Mg Caps (Docusate sodium) .... One capsule by mouth three times a day 4)  Ibuprofen 200 Mg Tabs (Ibuprofen) .... One tablet by mouth as needed for pain 5)  Nexium 40 Mg Cpdr (Esomeprazole magnesium) .... One capsule by mouth daily 30 minutes before breakfast 6)  Polyethylene Glycol Powd (Polyethylene glycol 1450) .Marland Kitchen.. 1 capful mixed with 8 oz glass of water/juice daily 7)  Nu-iron 150 Mg Caps (Polysaccharide iron complex) .... One tablet by mouth daily 8)  Cymbalta 30 Mg Cpep (Duloxetine hcl) .... One tablet by mouth daily 9)  Lamotrigine 25 Mg Tabs (Lamotrigine) .... One tablet by mouth at bedtime 10)  Vitamin B-1 100 Mg Tabs (Thiamine hcl) .... One tablet by mouth 11)  Zolpidem Tartrate 10 Mg Tabs (Zolpidem tartrate) .... One tablet by mouth nightly as needed for rest 12)  Metaxalone 800 Mg Tabs (Metaxalone) .... One tablet by mouth daily as needed for spasms 13)  Gabapentin 300 Mg Caps (Gabapentin) .... One capsule by mouth two times a day for nerves   Patient Instructions: 1)  You have been given the flu vaccine today. 2)  You have been given depomedrol and toradol. 3)  Start Neurontin 300mg  by mouth nightly for 1 week then increase to two times a day. 4)  Muscle relaxer - metaxolone 800mg  as needed for spasms - this will likely need prior approval. 5)  Follow up as needed Prescriptions: GABAPENTIN 300 MG CAPS (GABAPENTIN) One capsule by mouth two times a day for nerves  #60 x 1   Entered and Authorized by:   Lehman Prom FNP   Signed by:   Lehman Prom FNP on 02/02/2010   Method used:   Print  then Give to Patient   RxID:   [id number] METAXALONE 800 MG TABS (METAXALONE) One tablet by mouth daily as needed for spasms  #30 x 0   Entered and Authorized by:   Lehman Prom FNP   Signed by:   Lehman Prom FNP on 02/02/2010   Method used:   Print then Give to Patient   RxID:   [id number]    Medication Administration  Injection # 1:    Medication: Depo- Medrol 80mg     Diagnosis: BACK PAIN (ICD-724.5)    Route: IM    Site: RUOQ gluteus    Exp Date: 09/20/2012    Lot #: [id number]    Mfr: Pharmacia    Patient tolerated injection without complications    Given by: Michelle Nasuti (February 02, 2010 3:26 PM)  Injection # 2:    Medication: Ketorolac-Toradol 15mg     Diagnosis: BACK PAIN (ICD-724.5)    Route: IM    Site: RUOQ gluteus    Exp Date: 02/21/2011    Lot #: [id number]    Mfr: [email or web address] LIMITED    Patient tolerated injection without complications    Given by: Michelle Nasuti (February 02, 2010 3:28 PM)  Orders Added: 1)  Admin of Therapeutic Inj  intramuscular or subcutaneous [96372] 2)  Ketorolac-Toradol 15mg  [J1885] 3)  Depo- Medrol 80mg  [J1040] 4)  Est. Patient Level IV [25956] 5)  Flu Vaccine 79yrs + [90658] 6)  Admin 1st Vaccine [38756]  Appended Document: Back pain    Clinical Lists Changes  Orders: Added new Service order of Influenza Vaccine NON MCR (43329) - Signed Observations: Added new observation of FLU VAX#1VIS: 11/15/09 version given February 02, 2010. (02/02/2010 18:00) Added new observation of FLU VAXLOT: [id number] (02/02/2010 18:00) Added new observation of FLU VAX EXP: 10/21/2010 (02/02/2010 18:00) Added new observation of FLU VAXBY: Chantel Miller (02/02/2010 18:00) Added new observation of FLU VAXRTE: IM (02/02/2010 18:00) Added new observation of FLU VAX DSE: 0.5 ml (02/02/2010 18:00) Added new observation of FLU VAXMFR: GlaxoSmithKline (02/02/2010 18:00) Added new observation of FLU VAX SITE: right deltoid (02/02/2010  18:00) Added new observation of FLU VAX: Fluvax Non-MCR (02/02/2010 18:00)       Influenza Vaccine    Vaccine Type: Fluvax Non-MCR    Site: right deltoid    Mfr: GlaxoSmithKline    Dose: 0.5 ml    Route: IM    Given by: Michelle Nasuti    Exp. Date: 10/21/2010    Lot #: [id number]    VIS given: 11/15/09 version given February 02, 2010.  Flu Vaccine Consent Questions    Do you have a history of severe allergic reactions to this vaccine? no    Any prior history of allergic reactions to egg and/or gelatin? no    Do you have a sensitivity to the preservative Thimersol? no    Do you have a past history of Guillan-Barre Syndrome? no    Do you currently have an acute febrile illness? no    Have you ever had a severe reaction to latex? no    Vaccine information given and explained to patient? yes    Are you currently pregnant? no

## 2010-05-23 NOTE — Progress Notes (Signed)
Summary: [facility]    Phone Note Outgoing Call   Summary of Call: Anmerine, from [facility] called in on the pt above ([name])  because the pt had been have chest pain  (for a week) and rectum bleeding for (two weeks).  Pt is taking only current medications. You can reach Amerine at [phone] [facility] FNP  Initial call taken by: Manon Hilding,  May 13, 2009 12:21 PM  Follow-up for Phone Call        Grayce Sessions is a casemanager please contact her back to schedule the patient an appt please.... Regina Follow-up by: Mikey College CMA,  May 13, 2009 1:12 PM  Additional Follow-up for Phone Call Additional follow up Details #1::        Appointment scheduled The pt will come tomorrow (May 18, 2009 at 3;30 pm.) Manon Hilding  May 17, 2009 10:39 AM

## 2010-05-23 NOTE — Letter (Signed)
Summary: RECEIVED RECORDS FROM [facility]//HISTORICAL CHART  RECEIVED RECORDS FROM [facility]//HISTORICAL CHART   Imported By: Arta Bruce 01/12/2010 10:26:08  _____________________________________________________________________  External Attachment:    Type:   Image     Comment:   External Document

## 2010-05-23 NOTE — Letter (Signed)
Summary: [name] /EMERG[facility]Y DEPT  [facility] /EMERGENCY DEPT   Imported By: Arta Bruce 01/19/2010 12:36:24  _____________________________________________________________________  External Attachment:    Type:   Image     Comment:   External Document

## 2010-05-23 NOTE — Assessment & Plan Note (Signed)
Summary: Vaginal Discharge   Vital Signs:  Patient profile:   40 year old female Menstrual status:  hysterectomy 2008 Weight:      152.2 pounds BMI:     29.83 BSA:     1.66 Temp:     97.1 degrees F oral Pulse rate:   68 / minute Pulse rhythm:   regular Resp:     16 per minute BP sitting:   100 / 66  (left arm) Cuff size:   regular  Vitals Entered By: Levon Hedger (March 27, 2010 4:08 PM)  Nutrition Counseling: Patient's BMI is greater than 25 and therefore counseled on weight management options. CC: vaginal discharge since thanksgiving with alot of itching...needs to discuss pain clinic Is Patient Diabetic? No Pain Assessment Patient in pain? yes     Location: back Intensity: 9  Does patient need assistance? Functional Status Self care Ambulation Normal   CC:  vaginal discharge since thanksgiving with alot of itching...needs to discuss pain clinic.  History of Present Illness:  Pt into the office with c/o vaginal discharge started about 2 weeks ago she tried to use some OTC cream and symptoms are not resolved.  she also had some cream from another Rx that she tried to use for relief and she did not get any relief. Notes that when she has intercourse with her fiance then she starts with some vaginal d/c usually clears on it own but she notes that this has been going on for the past 2 weeks so she decided to come in +vaginal discharge +dysuria -hematuria +vaginal itching +lower abdominal pain  Pain Clinic - pt  missed her appt at the pain clinic. She had to reschedule her appt due to having both an appt there and at Mental Health on the same day.  She had rescheduled so many times that the office would not reschedule and pt went to her mental health appt Pt does NOT miss appts in this office.  She does sometimes reschedules them as she has to find someone (usually her fiance to bring her) Pt would like referral to Pain Clinic due to constant pain in her  back. No narcotics are being prescribed by this office  Habits & Providers  Alcohol-Tobacco-Diet     Alcohol drinks/day: <1     Alcohol Counseling: to decrease amount and/or frequency of alcohol intake     Alcohol type: wine     Tobacco Status: current     Tobacco Counseling: to quit use of tobacco products     Cigarette Packs/Day: 0.5     Year Started: age 34  Exercise-Depression-Behavior     Does Patient Exercise: yes     Exercise Counseling: to improve exercise regimen     Type of exercise: walking     Exercise (avg: min/session): <30     Times/week: 3     Depression Counseling: further diagnostic testing and/or other treatment is indicated     Drug Use: past  Comments: down to 1 cigs per day  Allergies: 1)  ! Ketorolac Tromethamine (Ketorolac Tromethamine) 2)  ! Imitrex (Sumatriptan Succinate) 3)  ! Cipro (Ciprofloxacin Hcl) 4)  ! Valproic Acid (Valproic Acid) 5)  ! * Lithium  Review of Systems General:  Denies fever. CV:  Denies chest pain or discomfort. Resp:  Denies cough. GI:  Denies abdominal pain, nausea, and vomiting. GU:  Complains of dysuria.  Physical Exam  General:  alert.   Head:  normocephalic.  long hair Lungs:  normal breath sounds.   Heart:  normal rate and regular rhythm.   Genitalia:  self wet prep Neurologic:  alert & oriented X3.   Skin:  up to the exam table Psych:  Oriented X3.     Impression & Recommendations:  Problem # 1:  VAGINAL DISCHARGE (ICD-623.5) bacterial vaginosis - reviewed dx with pt cream given pt also advised that she had some yeast - reviewed hygiene with pt Orders: KOH/ WET Mount [phone]) Pap Smear, Thin Prep ( Collection of) (N5621)  Her updated medication list for this problem includes:    Metrogel-vaginal 0.75 % Gel (Metronidazole) ..... One applicator intravaginally at night x 5 night  Problem # 2:  BACK PAIN (ICD-724.5) will refer to pain clinic ongoing pain with back Her updated medication list for  this problem includes:    Ibuprofen 800 Mg Tabs (Ibuprofen) ..... One tablet by mouth two times a day as needed for pain    Cyclobenzaprine Hcl 10 Mg Tabs (Cyclobenzaprine hcl) ..... One tablet by mouth daily as needed for muscles  Complete Medication List: 1)  Qvar 40 Mcg/act Aers (Beclomethasone dipropionate) .... 2 puffs inhalation two times a day  **pharmacy - d/c advair** 2)  Proventil Hfa 108 (90 Base) Mcg/act Aers (Albuterol sulfate) .Marland Kitchen.. 1-2 inhalations every 6 hours as needed for shortness of breath 3)  Dulcolax Stool Softener 100 Mg Caps (Docusate sodium) .... One capsule by mouth three times a day 4)  Ibuprofen 800 Mg Tabs (Ibuprofen) .... One tablet by mouth two times a day as needed for pain 5)  Nexium 40 Mg Cpdr (Esomeprazole magnesium) .... One capsule by mouth daily 30 minutes before breakfast 6)  Polyethylene Glycol Powd (Polyethylene glycol 1450) .Marland Kitchen.. 1 capful mixed with 8 oz glass of water/juice daily 7)  Nu-iron 150 Mg Caps (Polysaccharide iron complex) .... One tablet by mouth daily 8)  Cymbalta 30 Mg Cpep (Duloxetine hcl) .... One tablet by mouth daily 9)  Lamotrigine 25 Mg Tabs (Lamotrigine) .... One tablet by mouth at bedtime 10)  Vitamin B-1 100 Mg Tabs (Thiamine hcl) .... One tablet by mouth 11)  Zolpidem Tartrate 10 Mg Tabs (Zolpidem tartrate) .... One tablet by mouth nightly as needed for rest 12)  Gabapentin 300 Mg Caps (Gabapentin) .... One capsule by mouth two times a day for nerves 13)  Cyclobenzaprine Hcl 10 Mg Tabs (Cyclobenzaprine hcl) .... One tablet by mouth daily as needed for muscles 14)  Metrogel-vaginal 0.75 % Gel (Metronidazole) .... One applicator intravaginally at night x 5 night 15)  Fluconazole 150 Mg Tabs (Fluconazole) .... One tablet by mouth x 1 dose and then repeat in 1 week  Patient Instructions: 1)  You do have both a vaginal and yeast infection.  2)  Use the vaginal cream for the infection since pill form since it will given yeast  infection. 3)  You do also have a yeast infection -take diflucan 150mg  by mouth today then repeat in 1 week after you finish the vaginal cream 4)  Pain Clinic referral - you will be updated with time/date of appointment.  You may have go be sent to another office 5)  Follow up as needed Prescriptions: FLUCONAZOLE 150 MG TABS (FLUCONAZOLE) One tablet by mouth x 1 dose and then repeat in 1 week  #2 x 0   Entered and Authorized by:   Lehman Prom FNP   Signed by:   Lehman Prom FNP on 03/27/2010   Method used:   Print then Give to  Patient   RxID:   [id number] METROGEL-VAGINAL 0.75 % GEL (METRONIDAZOLE) One applicator intravaginally at night x 5 night  #45gm x 0   Entered and Authorized by:   Lehman Prom FNP   Signed by:   Lehman Prom FNP on 03/27/2010   Method used:   Print then Give to Patient   RxID:   [id number]    Orders Added: 1)  Est. Patient Level III [84696] 2)  KOH/ WET Mount [29528] 3)  Pap Smear, Thin Prep ( Collection of) [Q0091]    Laboratory Results   Urine Tests  Date/Time Received: March 27, 2010 5:00 PM   Routine Urinalysis   Color: yellow Glucose: negative   (Normal Range: Negative) Bilirubin: negative   (Normal Range: Negative) Ketone: negative   (Normal Range: Negative) Spec. Gravity: 1.020   (Normal Range: 1.003-1.035) Blood: trace-intact   (Normal Range: Negative) pH: 5.5   (Normal Range: 5.0-8.0) Protein: negative   (Normal Range: Negative) Urobilinogen: 0.2   (Normal Range: 0-1) Nitrite: negative   (Normal Range: Negative) Leukocyte Esterace: large   (Normal Range: Negative)    Date/Time Received: March 27, 2010   Wet Mount Source: vaginal WBC/hpf: 1-5 Bacteria/hpf: 1+ Clue cells/hpf: moderate Yeast/hpf: few Wet Mount KOH: Negative Trichomonas/hpf: none

## 2010-05-23 NOTE — Progress Notes (Signed)
Summary: Ortho referral   Phone Note Call from Patient   Summary of Call: [name] CALLED,SAYING THAT [name] HAD REFERRED HER TO AN OTHO FOR HER BACK/THE PAIN CLINIC SHE WENT TO OVER A YEAR AGO IS NOT THERE ANYMORE AND THEY DON'T KNOW  WHERE THEY WENT. ORTHO SAIDS THEY WILL NOT SCHEDULE AN APPT WITHOUT THE RECORDS FROM THE PAIN CLINIC.[name] DOESN'T KNOW  WHAT TO DO ?PLEASE CALL HER BACK @ [phone] OR [phone]/SHE SAID [id number] IS THE BETTER NUMBER Initial call taken by: Arta Bruce,  July 20, 2009 3:49 PM  Follow-up for Phone Call        forward to N. Daphine Deutscher, FNP Follow-up by: Levon Hedger,  July 20, 2009 3:55 PM  Additional Follow-up for Phone Call Additional follow up Details #1::        There is already a phone note open regarding this matter and Stanton Kidney is working on it Additional Follow-up by: Lehman Prom FNP,  July 20, 2009 4:26 PM

## 2010-05-23 NOTE — Progress Notes (Signed)
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   [facility] FNP Initial call taken by: Manon Hilding,  July 01, 2009 10:48 AM  Follow-up for Phone Call        Stanton Kidney, can you f/u on this? it appears pt was referred to [facility] ortho in January 2011 Follow-up by: Lehman Prom FNP,  July 05, 2009 2:03 PM  Additional Follow-up for Phone Call Additional follow up Details #1::        Do we have an idea of how long ago the pt was seen by this other practice? Additional Follow-up by: Candi Leash,  July 11, 2009 10:47 AM    Additional Follow-up for Phone Call Additional follow up Details #2::    Sorry I don't, she was being seen by several specialist when she transferred to this office- I would assume it was within the past year but the pt is a good historian so you may check with her.   n.martin, fnp  July 15, 2009  3:06 PM    Additional Follow-up for Phone Call Additional follow up Details #3:: Details for Additional Follow-up Action Taken: Hailey from [facility] will sched pt with Dr.Brooks instead of Dr.Ramos whose the short term Pain Managament Provider.The office will contaqct pt with appt.  noted.  n.martin,fnp  July 21, 2009  9:54 AM  Additional Follow-up by: Candi Leash,  July 21, 2009 8:55 AM

## 2010-05-23 NOTE — Letter (Signed)
Summary: [facility]/EMERGENCY  [facility]   Imported By: Arta Bruce 01/19/2010 12:25:22  _____________________________________________________________________  External Attachment:    Type:   Image     Comment:   External Document

## 2010-05-23 NOTE — Progress Notes (Signed)
Summary: INS WONT PAY FOR NEW MED  Phone Note Call from Patient Call back at [phone]   Reason for Call: Refill Medication Summary of Call: [name] [name] CALLED CALLED AND SAYS THAT THE NEW MEDICINE THAT YOU PRESCRIBED INSTEAD OF THE FLEXERIL IS NOT BEING COVERED UNDER HER INS, SO SHE IS ASKING IF YOU CAN PUT HER BACK ON THE FLEXERIL, BECAUSE THE INS. WILL PAY FOR IT AND SHE WANTS TO KNOW IF YOU CAN CALL IN A PAIN MEDICATION TO ALONG WITH THE FLEXERIL UNTIL SHE GETS INTO THE PAIN CLINIC ON 11/04.  SHE USES [facility] [place]. Initial call taken by: Leodis Rains,  February 08, 2010 12:39 PM  Follow-up for Phone Call        [name] is not on the Medicaid preferred or non-preferred list.  Dutch Quint RN  February 10, 2010 4:23 PM   Additional Follow-up for Phone Call Additional follow up Details #1::        will restart the cyclobenzaprine (flexeril) and ibuprorfen 800mg  by mouth two times a day (she needs to take with food) Rx in your basket - [facility] Additional Follow-up by: Lehman Prom FNP,  February 13, 2010 6:51 PM    Additional Follow-up for Phone Call Additional follow up Details #2::    Rxs faxed -- Left message on answering machine for pt. to return call.  Dutch Quint RN  February 14, 2010 9:19 AM  Pt. notified of faxed Rxs and [name]'s recommendations -- verbalized understanding.  Dutch Quint RN  February 14, 2010 4:40 PM   New/Updated Medications: IBUPROFEN 800 MG TABS (IBUPROFEN) One tablet by mouth two times a day as needed for pain CYCLOBENZAPRINE HCL 10 MG TABS (CYCLOBENZAPRINE HCL) One tablet by mouth daily as needed for muscles Prescriptions: IBUPROFEN 800 MG TABS (IBUPROFEN) One tablet by mouth two times a day as needed for pain  #60 x 0   Entered and Authorized by:   Lehman Prom FNP   Signed by:   Lehman Prom FNP on 02/13/2010   Method used:   Printed then faxed to ...       [facility]  [address] [phone][facility] (retail)       [address], [place]  [address]       Ph: [phone] or [phone]       Fax: [phone]   RxID:   [id number] CYCLOBENZAPRINE HCL 10 MG TABS (CYCLOBENZAPRINE HCL) One tablet by mouth daily as needed for muscles  #30 x 0   Entered and Authorized by:   Lehman Prom FNP   Signed by:   Lehman Prom FNP on 02/13/2010   Method used:   Printed then faxed to ...       [facility]  [address] [phone] ([facility])       [address]       [place], [place]  [address]       Ph: [phone] or [phone]       Fax: [phone]   RxID:   [id number]

## 2010-05-23 NOTE — Letter (Signed)
Summary: EMERGENCY DEPARTMENT  EMERGENCY DEPARTMENT   Imported By: Arta Bruce 10/21/2009 15:57:41  _____________________________________________________________________  External Attachment:    Type:   Image     Comment:   External Document

## 2010-05-23 NOTE — Progress Notes (Signed)
Summary: Ortho appt   Phone Note From Other Clinic   Caller: Referral Coordinator Summary of Call: Pt is aware of appt with Dr.Brooks @ [facility] on 08-02-09 Initial call taken by: Candi Leash,  August 01, 2009 3:12 PM  Follow-up for Phone Call        Very good noted Follow-up by: Lehman Prom FNP,  August 01, 2009 4:01 PM

## 2010-05-24 LAB — BASIC METABOLIC PANEL
BUN: 11 mg/dL (ref 6–23)
Chloride: 96 mEq/L (ref 96–112)
Creatinine, Ser: 0.73 mg/dL (ref 0.4–1.2)
GFR calc non Af Amer: >60 mL/min (ref >60)
Glucose, Bld: 108 mg/dL — ABNORMAL HIGH (ref 70–99)

## 2010-05-24 LAB — MAGNESIUM: Magnesium: 2.1 mg/dL (ref 1.5–2.5)

## 2010-05-24 LAB — CBC
MCH: 30.3 pg (ref 26.0–34.0)
MCV: 90.1 fL (ref 78.0–100.0)
Platelets: 485 10*3/uL — ABNORMAL HIGH (ref 150–400)
RDW: 14.3 % (ref 11.5–15.5)

## 2010-05-25 LAB — CULTURE, BLOOD (ROUTINE X 2)
Culture  Setup Time: 201201271732
Culture: NO GROWTH

## 2010-05-25 NOTE — Miscellaneous (Signed)
Summary: Pain clinic referral  Phone Note Outgoing Call   Summary of Call: refer pt to pain clinic at [facility]  Initial call taken by: Lehman Prom FNP,  April 03, 2010 6:39 PM  Follow-up for Phone Call        i send the referral to [facility] waiting for an appt . they will contact the pt first they don't do third party Follow-up by: Cheryll Dessert,  April 05, 2010 1:32 PM    Clinical Lists Changes  Orders: Added new Referral order of Pain Clinic Referral (Pain) - Signed

## 2010-05-25 NOTE — Progress Notes (Signed)
Summary: Recurrent vaginal  infection  Phone Note Call from Patient   Summary of Call: came in with yeast & bacterical infection/ took all the meds given and it cleared up now it is back again /would you please call in the pill and the cream she had last time [name]  phone# [phone]   pt phone number  [phone] Initial call taken by: Arta Bruce,  May 05, 2010 11:11 AM  Follow-up for Phone Call         Left message on answering machine for pt. to return call.  Dutch Quint RN  May 05, 2010 12:24 PM  Treated last month for BV.   Exact same symptoms like last time, itching like yeast, has odor "like bacteria".  Discharge is yellowish with odor, itches as well.  All the symptoms went away for awhile, reoccurred yesterday.  Thinks it never completely went away because she waited so long to have it treated the last time.   Follow-up by: Dutch Quint RN,  May 05, 2010 12:36 PM  Additional Follow-up for Phone Call Additional follow up Details #1::        Will refill meds as ordered on last month speak with pt about hygiene habits as well given recurrence in just 1 month if symptoms return again before 3 months then she will need f/u appt Additional Follow-up by: Lehman Prom FNP,  May 05, 2010 1:13 PM    Additional Follow-up for Phone Call Additional follow up Details #2::    Left message on answering machine for pt. to return call.  Dutch Quint RN  May 05, 2010 2:45 PM  Advised pt. of appropriate perineal hygiene, cotton panties/cotton-lined undergarments, wearing looser clothing around perineal area, avoid scented/colored soaps and products, don't douche.  Notified of refilled Rx and provider's response.  Verbalized understanding and agreement.  Dutch Quint RN  May 05, 2010 6:43 PM   Prescriptions: FLUCONAZOLE 150 MG TABS (FLUCONAZOLE) One tablet by mouth x 1 dose and then repeat in 1 week  #2 x 0   Entered and Authorized by:    Lehman Prom FNP   Signed by:   Lehman Prom FNP on 05/05/2010   Method used:   [facility]  [address] [phone][facility] (retail)       [address] [place], [place]  [address]       Ph: [phone] or [phone]       Fax: [phone]   RxID:   [id number] METROGEL-VAGINAL 0.75 % GEL (METRONIDAZOLE) One applicator intravaginally at night x 5 night  #45gm x 0   Entered and Authorized by:   Lehman Prom FNP   Signed by:   Lehman Prom FNP on 05/05/2010   Method used:   [facility]        [facility]  [address] [phone][facility] (retail)       [address] [place], [place]  [address]       Ph: [phone] or [phone]       Fax: [phone]   RxID:   [id number]

## 2010-05-25 NOTE — Progress Notes (Signed)
Summary: Labored breathing, persistent cough  Phone Note Call from Patient   Summary of Call: Five days ago, was having problems with cold and congestion,  took Robitussin sinus pills OTC and was starting to improve.  Three days ago, woke up with a bad asthma attack, not being able to catch her breath.  Took valium to calm her down and laid down, got a little better.  Yesterday was still having a rough time breathing, nothing really bad.  Today, has been in and out of sleep, losing track of the day, disoriented, unable to catch her breath when awake, coughing spells keeping her awake, productive of green-Tahiry Spicer mucus, tinged with blood  the last few times.  Unable to walk to bathroom without becoming winded, has bad headache, unsure if has fever.  Breathing is labored, is audible over the phone.  Voice quality weak.  Advised to go to ED or UC for evaluation and treatment today,  Verbalized understanding and agreement.  Initial call taken by: Dutch Quint RN,  May 17, 2010 4:15 PM

## 2010-05-29 ENCOUNTER — Emergency Department (HOSPITAL_COMMUNITY): Payer: Medicaid Other

## 2010-05-29 ENCOUNTER — Emergency Department (HOSPITAL_COMMUNITY)
Admission: EM | Admit: 2010-05-29 | Discharge: 2010-05-29 | Disposition: A | Discharge status: Home / Self Care | Payer: Medicaid Other | Attending: Emergency Medicine | Admitting: Emergency Medicine

## 2010-05-29 DIAGNOSIS — J45909 Unspecified asthma, uncomplicated: Secondary | ICD-10-CM | POA: Insufficient documentation

## 2010-05-29 DIAGNOSIS — R0609 Other forms of dyspnea: Secondary | ICD-10-CM | POA: Insufficient documentation

## 2010-05-29 DIAGNOSIS — R0602 Shortness of breath: Secondary | ICD-10-CM | POA: Insufficient documentation

## 2010-05-29 DIAGNOSIS — F952 Tourette's disorder: Secondary | ICD-10-CM | POA: Insufficient documentation

## 2010-05-29 DIAGNOSIS — R0989 Other specified symptoms and signs involving the circulatory and respiratory systems: Secondary | ICD-10-CM | POA: Insufficient documentation

## 2010-05-29 DIAGNOSIS — R109 Unspecified abdominal pain: Secondary | ICD-10-CM | POA: Insufficient documentation

## 2010-05-29 DIAGNOSIS — R112 Nausea with vomiting, unspecified: Secondary | ICD-10-CM | POA: Insufficient documentation

## 2010-05-29 DIAGNOSIS — Z79899 Other long term (current) drug therapy: Secondary | ICD-10-CM | POA: Insufficient documentation

## 2010-05-29 DIAGNOSIS — R079 Chest pain, unspecified: Secondary | ICD-10-CM | POA: Insufficient documentation

## 2010-05-29 LAB — URINALYSIS, ROUTINE W REFLEX MICROSCOPIC
Bilirubin Urine: NEGATIVE
Ketones, ur: NEGATIVE mg/dL
Nitrite: NEGATIVE
Protein, ur: NEGATIVE mg/dL
Specific Gravity, Urine: 1.03 (ref 1.005–1.030)
Urobilinogen, UA: 0.2 mg/dL (ref 0.0–1.0)

## 2010-05-29 LAB — HEPATIC FUNCTION PANEL
AST: 18 U/L (ref 0–37)
Bilirubin, Direct: <0.1 mg/dL (ref 0.0–0.3)
Total Bilirubin: 0.2 mg/dL — ABNORMAL LOW (ref 0.3–1.2)

## 2010-05-29 LAB — LIPASE, BLOOD: Lipase: 29 U/L (ref 11–59)

## 2010-05-29 LAB — DIFFERENTIAL
Eosinophils Absolute: 0.2 10*3/uL (ref 0.0–0.7)
Eosinophils Relative: 2 % (ref 0–5)
Lymphs Abs: 3.2 10*3/uL (ref 0.7–4.0)
Monocytes Absolute: 0.7 10*3/uL (ref 0.1–1.0)

## 2010-05-29 LAB — CBC
MCH: 30.6 pg (ref 26.0–34.0)
MCHC: 33.7 g/dL (ref 30.0–36.0)
MCV: 90.8 fL (ref 78.0–100.0)
Platelets: 499 10*3/uL — ABNORMAL HIGH (ref 150–400)
RDW: 14.8 % (ref 11.5–15.5)
WBC: 9 10*3/uL (ref 4.0–10.5)

## 2010-05-29 LAB — BASIC METABOLIC PANEL
BUN: 11 mg/dL (ref 6–23)
Calcium: 8.9 mg/dL (ref 8.4–10.5)
GFR calc non Af Amer: >60 mL/min (ref >60)
Potassium: 4.1 mEq/L (ref 3.5–5.1)

## 2010-05-29 MED ORDER — IOHEXOL 300 MG/ML  SOLN
125.0000 mL | Freq: Once | INTRAMUSCULAR | Status: AC | PRN
  Administered 2010-05-29: 125 mL via INTRAVENOUS

## 2010-05-31 ENCOUNTER — Encounter (INDEPENDENT_AMBULATORY_CARE_PROVIDER_SITE_OTHER): Payer: Self-pay | Admitting: Nurse Practitioner

## 2010-06-04 ENCOUNTER — Emergency Department (HOSPITAL_COMMUNITY)
Admission: EM | Admit: 2010-06-04 | Discharge: 2010-06-04 | Disposition: A | Discharge status: Home / Self Care | Payer: Medicaid Other | Attending: Emergency Medicine | Admitting: Emergency Medicine

## 2010-06-04 ENCOUNTER — Emergency Department (HOSPITAL_COMMUNITY): Payer: Medicaid Other

## 2010-06-04 DIAGNOSIS — E871 Hypo-osmolality and hyponatremia: Secondary | ICD-10-CM | POA: Insufficient documentation

## 2010-06-04 DIAGNOSIS — F952 Tourette's disorder: Secondary | ICD-10-CM | POA: Insufficient documentation

## 2010-06-04 DIAGNOSIS — IMO0002 Reserved for concepts with insufficient information to code with codable children: Secondary | ICD-10-CM | POA: Insufficient documentation

## 2010-06-04 DIAGNOSIS — E162 Hypoglycemia, unspecified: Secondary | ICD-10-CM | POA: Insufficient documentation

## 2010-06-04 DIAGNOSIS — R197 Diarrhea, unspecified: Secondary | ICD-10-CM | POA: Insufficient documentation

## 2010-06-04 DIAGNOSIS — J45909 Unspecified asthma, uncomplicated: Secondary | ICD-10-CM | POA: Insufficient documentation

## 2010-06-04 DIAGNOSIS — E876 Hypokalemia: Secondary | ICD-10-CM | POA: Insufficient documentation

## 2010-06-04 DIAGNOSIS — F988 Other specified behavioral and emotional disorders with onset usually occurring in childhood and adolescence: Secondary | ICD-10-CM | POA: Insufficient documentation

## 2010-06-04 DIAGNOSIS — R112 Nausea with vomiting, unspecified: Secondary | ICD-10-CM | POA: Insufficient documentation

## 2010-06-04 LAB — URINALYSIS, ROUTINE W REFLEX MICROSCOPIC
Nitrite: NEGATIVE
Urine Glucose, Fasting: NEGATIVE mg/dL
pH: 5.5 (ref 5.0–8.0)

## 2010-06-04 LAB — SAMPLE TO BLOOD BANK

## 2010-06-04 LAB — CBC
MCH: 30.9 pg (ref 26.0–34.0)
MCHC: 35.1 g/dL (ref 30.0–36.0)
MCV: 88 fL (ref 78.0–100.0)
Platelets: 496 10*3/uL — ABNORMAL HIGH (ref 150–400)
RDW: 14.5 % (ref 11.5–15.5)
WBC: 10.4 10*3/uL (ref 4.0–10.5)

## 2010-06-04 LAB — GLUCOSE, CAPILLARY
Glucose-Capillary: 50 mg/dL — ABNORMAL LOW (ref 70–99)
Glucose-Capillary: 77 mg/dL (ref 70–99)

## 2010-06-04 LAB — COMPREHENSIVE METABOLIC PANEL
Albumin: 3.8 g/dL (ref 3.5–5.2)
BUN: 9 mg/dL (ref 6–23)
Calcium: 9 mg/dL (ref 8.4–10.5)
Creatinine, Ser: 0.89 mg/dL (ref 0.4–1.2)
Glucose, Bld: 67 mg/dL — ABNORMAL LOW (ref 70–99)
Total Protein: 6.6 g/dL (ref 6.0–8.3)

## 2010-06-04 LAB — LIPASE, BLOOD: Lipase: 16 U/L (ref 11–59)

## 2010-06-06 NOTE — Discharge Summary (Signed)
[name], [name] NO.:  [id number]  MEDICAL RECORD NO.:  [id number]          PATIENT TYPE:  INP  LOCATION:  1535                         FACILITY:  [facility]  PHYSICIAN:  Erick Blinks, MD     DATE OF BIRTH:  1971-01-10  DATE OF ADMISSION:  05/17/2010 DATE OF DISCHARGE:  05/24/2010                              DISCHARGE SUMMARY   PRIMARY CARE PHYSICIAN:  Lehman Prom, FNP, at [facility]  DISCHARGE DIAGNOSES: 1. Acute hypoxic respiratory failure, improved. 2. Gastroesophageal reflux disease. 3. Acute delirium/metabolic encephalopathy secondary to #1, improved. 4. Acute respiratory distress syndrome, improved. 5. Bilateral community-acquired pneumonia. 6. Acute pulmonary edema, improved. 7. History of Tourette syndrome. 8. Chronic pain syndrome. 9. History of nephrolithiasis. 10.Anxiety.  DISCHARGE MEDICATIONS: 1. Augmentin 875 mg p.o. b.i.d. for 3 more days. 2. Prednisone 10 mg p.o. daily for 2 more days. 3. QVAR inhalation 40 mcg 2 puffs inhale b.i.d. 4. Proventil inhaler 1-2 puffs inhale q.6 h p.r.n. 5. Colace 100 mg p.o. t.i.d. p.r.n. 6. Ibuprofen 800 mg 1 tablet p.o. b.i.d. p.r.n. 7. Nexium 40 mg 1 capsule p.o. daily. 8. MiraLAX 17 g p.o. daily. 9. Ferrous sulfate 325 mg 1 tablet p.o. daily. 10.Ambien 10 mg 1 tablet p.o. at bedtime p.r.n. 11.Gabapentin 300 mg 1 capsule p.o. b.i.d. 12.Cyclobenzaprine 10 mg 1 tablet p.o. daily p.r.n. 13.Prozac 40 mg 1 capsule p.o. q.a.m. 14.Orap 1 mg tablet, half a tablet p.o. at bedtime. 15.Diazepam 5 mg 1 tablet p.o. t.i.d. 16.Tylenol Sinus 325/5 mg 1 tablet p.o. daily. 17.Goody's powder 1 packet p.o. daily p.r.n. for headache.  ADMISSION HISTORY:  This is a 40 year old female who presented to the emergency room with complaints of shortness of breath.  The patient had cough and shortness of breath for 1 day prior to admission.  She had mild fever but no chills.  She presented to the ER for evaluation  where she was found to have a leukocytosis of 15,900.  Her chest x-ray showed perihilar and upper lobe airspace opacity and she was subsequently referred for admission.  For further details, please refer to the history and physical dictated by Dr. Mikeal Hawthorne.  HOSPITAL COURSE:  Acute hypoxic respiratory failure:  The patient was admitted to the telemetry unit where she was monitored for her respiratory status.  She was placed empirically on Rocephin and azithromycin.  Despite these measures, her respiratory status continued to decline.  She was also placed on stress dose steroids due to her history of asthma and eventually required a BiPAP for oxygenation. She was transferred to the step-down unit, was subsequently seen by the Pulmonary Critical Care Medicine physicians.  Serial x-rays revealed that the patient was developing ARDS.  She was subsequently intubated and was taken over by the Critical Care service.  She was placed on ARDS protocol as well as vancomycin was added to her antibiotic regimen since she has a history of prior MRSA.  The patient was subsequently extubated and has done fairly well since then.  It was also noted that there was a component of fluid overload which was leading to her hypoxia.  A 2-D echocardiogram  was done which showed preserved ejection fraction and essentially no acute abnormalities.  She had an elevated brain natriuretic peptide of around 600.  She was started on Lasix and this has improved to 200.  The patient has done very well since then.  She has been watched on the regular floor.  She currently is breathing comfortably on room air, is ambulating without assistance, and is requesting to be discharged home.  She does still have a slight cough but otherwise no other complaints.  She will finish her course of antibiotics with Augmentin.  Her steroids have been tapered and will be complete in 2 more days.  The remainder of her medications have  been continued.  She will follow up with her primary care physician for further management.  She appears to be euvolemic at this time, her lungs are completely clear.  She does not have any signs of fluid overload. We will discharge her without any Lasix and this can be reassessed within one week by her primary care physician.  She has been told to check her weights on a daily basis as well as watch her salt content.  PHYSICAL EXAM ON DAY OF DISCHARGE:  VITAL SIGNS:  Temperature 98.2, heart rate of 59, respirations 16, blood pressure 92/54, pulse ox 95% on room air.  GENERAL: The patient is in no acute distress lying comfortably in bed.  HEENT: Normocephalic, atraumatic.  LUNGS:  Clear to auscultation bilaterally.  HEART:  S1, S2 with regular rate and rhythm. ABDOMEN:  Soft, nontender.  Bowel sounds are active.  EXTREMITIES:  No significant edema bilaterally.  DISCHARGE LABS:  Magnesium 2.1, sodium 137, potassium 3.6, chloride 96, bicarb 31, glucose of 108, BUN 11, creatinine 0.73, calcium 9.3.  WBC 12.6 on steroids, hemoglobin of 11.9, platelet count of 485.  DIAGNOSTIC IMAGING: 1. Chest x-ray, January 25, shows perihilar and upper lobe airspace     capacities left greater than right, suggesting infection including     atypical etiologies.  A follow up chest x-ray is recommended in 6     to 8 weeks to ensure radiographic resolution. 2. Chest x-ray from January 26 shows worsening airspace disease     compatible with worsening pneumonia. 3. Chest x-ray from January 27 shows marked worsening in bilateral     airspace disease could be due to progressive edema and/or     pneumonia. 4. Chest x-ray from January 28, shows stable support apparatus with     improved airspace disease and edema as detailed above. 5. Chest x-ray, January 29, shows interval extubation and removal of     enteric tube, increasing bilateral airspace disease compatible with     infection and findings suggestive of  superimposed edema. 6. Chest x-ray, January 30, shows slightly improved aeration with     stable support apparatus which probably represents pulmonary edema     superimposed on pneumonia. 7. 2-D echocardiogram, January 29, shows left ventricular cavity size     is normal, wall thickness is normal, ejection fraction of 55-60%,     wall motion is normal without any regional wall motion     abnormalities.  DISCHARGE INSTRUCTIONS:  The patient should continue on a low-salt diet, conduct her activity as tolerated.  She will see her primary care physician within the next 1 week and she reports that she will schedule this appointment.  Otherwise, she is stable for discharge. The patient is in agreement with this plan.  Time spent on this discharge, 45  minutes.     Erick Blinks, MD     JM/MEDQ  D:  05/24/2010  T:  05/24/2010  Job:  [id number]  cc:   Lehman Prom, FNP [facility] [address] [place], [place] [address]  Electronically Signed by Erick Blinks  on 06/06/2010 09:14:27 PM

## 2010-06-08 ENCOUNTER — Encounter: Payer: Self-pay | Admitting: Nurse Practitioner

## 2010-06-08 ENCOUNTER — Other Ambulatory Visit (HOSPITAL_COMMUNITY): Payer: Self-pay | Admitting: Urology

## 2010-06-08 ENCOUNTER — Encounter (INDEPENDENT_AMBULATORY_CARE_PROVIDER_SITE_OTHER): Payer: Self-pay | Admitting: Nurse Practitioner

## 2010-06-08 DIAGNOSIS — K5289 Other specified noninfective gastroenteritis and colitis: Secondary | ICD-10-CM | POA: Insufficient documentation

## 2010-06-08 DIAGNOSIS — B379 Candidiasis, unspecified: Secondary | ICD-10-CM | POA: Insufficient documentation

## 2010-06-08 DIAGNOSIS — J13 Pneumonia due to Streptococcus pneumoniae: Secondary | ICD-10-CM | POA: Insufficient documentation

## 2010-06-08 DIAGNOSIS — N135 Crossing vessel and stricture of ureter without hydronephrosis: Secondary | ICD-10-CM

## 2010-06-08 LAB — CONVERTED CEMR LAB
Glucose, Urine, Semiquant: NEGATIVE
KOH Prep: NEGATIVE
Protein, U semiquant: 30
WBC Urine, dipstick: NEGATIVE

## 2010-06-14 NOTE — Assessment & Plan Note (Signed)
Summary: S/p Hospitalization - Pneumonia   Vital Signs:  Patient profile:   40 year old female Menstrual status:  hysterectomy 2008 Height:      60 inches Weight:      147.4 pounds BMI:     28.89 O2 Sat:      97 % on Room air Temp:     97.0 degrees F oral Pulse rate:   72 / minute Pulse rhythm:   regular Resp:     18 per minute BP sitting:   99 / 60  (left arm) Cuff size:   regular  Vitals Entered By: [place] Shannon (June 08, 2010 9:05 AM)  Nutrition Counseling: Patient's BMI is greater than 25 and therefore counseled on weight management options.  O2 Flow:  Room air CC: hospital f/u...Marland KitchenMarland Kitchen pt says she has a yeast infection, Vaginal discharge, Abdominal Pain Is Patient Diabetic? Yes Pain Assessment Patient in pain? no       Does patient need assistance? Functional Status Self care Ambulation Normal   CC:  hospital f/u...Marland KitchenMarland Kitchen pt says she has a yeast infection, Vaginal discharge, and Abdominal Pain.  History of Present Illness:  Pt into the office for follow up  Reports hospitalization (full discharge reviewed) 1.  Acute hypoxic respiratory failure 2.  GERD 3.  Acute delirium/metabolic encephalopathy secondary to #1, improved 4.  Acute respiratory distress syndrome, improved 5.  Bilateral community-acquired pneumonia - Augmentin x 3 days 6.  Acute pulmonary edema 7.  History of Tourette syndrone 8.  Chronic pain syndrome 9.  History of nephrolithiasis 10.  Anxiety  And she returned back to the hospital earlier this week with GI problems - nausea and vomiting. Dx with gastroenteritis.  She was prescribed zofran however due to finances she did not get the Rx filled.  She had reached her medicaid Rx # for the month  Vaginal discharge      This is a 40 year old woman who presents with Vaginal discharge.  The symptoms began 1 week ago.  The severity is described as moderate.  The patient complains of itching and vaginal burning.  The discharge is described as white.   Risk factors for vaginal discharge include recent antibiotics.  Prior treatment has included hospitalization.      Dyspepsia History:      She has no alarm features of dyspepsia including no history of melena, hematochezia, dysphagia, persistent vomiting, or involuntary weight loss > 5%.  There is a prior history of GERD.  The patient does not have a prior history of documented ulcer disease.  The dominant symptom is not heartburn or acid reflux.  An H-2 blocker medication is not currently being taken.     Habits & Providers  Alcohol-Tobacco-Diet     Alcohol drinks/day: <1     Alcohol Counseling: to decrease amount and/or frequency of alcohol intake     Alcohol type: wine     Tobacco Status: current     Tobacco Counseling: to quit use of tobacco products     Cigarette Packs/Day: 0.5     Year Started: age 40  Exercise-Depression-Behavior     Does Patient Exercise: yes     Exercise Counseling: to improve exercise regimen     Type of exercise: walking     Exercise (avg: min/session): <30     Times/week: 3     Depression Counseling: further diagnostic testing and/or other treatment is indicated     Drug Use: past  Allergies (verified): 1)  ! Ketorolac  Tromethamine (Ketorolac Tromethamine) 2)  ! Imitrex (Sumatriptan Succinate) 3)  ! Cipro (Ciprofloxacin Hcl) 4)  ! Valproic Acid (Valproic Acid) 5)  ! * Lithium  Review of Systems General:  Denies loss of appetite. CV:  Denies chest pain or discomfort. Resp:  Denies cough. GI:  Complains of abdominal pain, nausea, and vomiting; Went to the ER earlier this week - reports that fiance had a stomach virus while she was in the hospital.  she eats a little bit at the time trying to keep food down. GU:  +vaginal itching.  Physical Exam  General:  alert.   Head:  normocephalic.   Ears:  ear piercing(s) noted.   Lungs:  normal breath sounds.   Heart:  normal rate and regular rhythm.   Abdomen:  normal bowel sounds.   Neurologic:   alert & oriented X3.   Skin:  color normal.   Psych:  Oriented X3.     Impression & Recommendations:  Problem # 1:  Hosp for PNEUMONIA, COMMUNITY ACQUIRED, PNEUMOCOCCAL (ICD-481) full hospital d/c reviewed pt has completed course of antibiotics as ordered  Problem # 2:  CANDIDIASIS (ICD-112.9)  likely due to recent antibiotics will start on fluconazole  Orders: UA Dipstick w/o Micro (automated)  (81003) KOH/ WET [place] [phone])  Problem # 3:  TOBACCO ABUSE (ICD-305.1)  advised pt to work on efforts at cessation  Problem # 4:  ACID REFLUX DISEASE (ICD-530.81)  Her updated medication list for this problem includes:    Nexium 40 Mg Cpdr (Esomeprazole magnesium) ..... One capsule by mouth daily 30 minutes before breakfast  Problem # 5:  GASTROENTERITIS (ICD-558.9) will given phenergan as needed  soft, bland diet  Complete Medication List: 1)  Qvar 40 Mcg/act Aers (Beclomethasone dipropionate) .... 2 puffs inhalation two times a day  **pharmacy - d/c advair** 2)  Proventil Hfa 108 (90 Base) Mcg/act Aers (Albuterol sulfate) .Marland Kitchen.. 1-2 inhalations every 6 hours as needed for shortness of breath 3)  Dulcolax Stool Softener 100 Mg Caps (Docusate sodium) .... One capsule by mouth three times a day 4)  Ibuprofen 800 Mg Tabs (Ibuprofen) .... One tablet by mouth two times a day as needed for pain 5)  Nexium 40 Mg Cpdr (Esomeprazole magnesium) .... One capsule by mouth daily 30 minutes before breakfast 6)  Polyethylene Glycol Powd (Polyethylene glycol 1450) .Marland Kitchen.. 1 capful mixed with 8 oz glass of water/juice daily 7)  Nu-iron 150 Mg Caps (Polysaccharide iron complex) .... One tablet by mouth daily 8)  Cymbalta 30 Mg Cpep (Duloxetine hcl) .... One tablet by mouth daily 9)  Lamotrigine 25 Mg Tabs (Lamotrigine) .... One tablet by mouth at bedtime 10)  Vitamin B-1 100 Mg Tabs (Thiamine hcl) .... One tablet by mouth 11)  Zolpidem Tartrate 10 Mg Tabs (Zolpidem tartrate) .... One tablet by mouth  nightly as needed for rest 12)  Gabapentin 300 Mg Caps (Gabapentin) .... One capsule by mouth two times a day for nerves 13)  Cyclobenzaprine Hcl 10 Mg Tabs (Cyclobenzaprine hcl) .... One tablet by mouth daily as needed for muscles 14)  Fluconazole 150 Mg Tabs (Fluconazole) .... One tablet by mouth x 1 dose and then repeat in 1 week 15)  Promethazine Hcl 25 Mg Tabs (Promethazine hcl) .... One tablet by mouth two times a day for stomach  Patient Instructions: 1)  GI irritation - this is likely viral.  You can take phenergan as needed for nausea/vomiting. 2)  Continue to eat small, bland (not spicy or  gravy food) meals - bananas, applesauce, crackers, chicken noodle soup. 3)  Drink Gatorade to restore your electrolytes 4)  Keep up your efforts to quit smoking. 5)  Follow up as needed Prescriptions: PROMETHAZINE HCL 25 MG TABS (PROMETHAZINE HCL) One tablet by mouth two times a day for stomach  #20 x 0   Entered and Authorized by:   Lehman Prom FNP   Signed by:   Lehman Prom FNP on 06/08/2010   Method used:   Print then Give to Patient   RxID:   [id number] FLUCONAZOLE 150 MG TABS (FLUCONAZOLE) One tablet by mouth x 1 dose and then repeat in 1 week  #2 x 0   Entered and Authorized by:   Lehman Prom FNP   Signed by:   Lehman Prom FNP on 06/08/2010   Method used:   Print then Give to Patient   RxID:   [id number]    Orders Added: 1)  UA Dipstick w/o Micro (automated)  [81003] 2)  Est. Patient Level III [84696] 3)  KOH/ [facility]    Laboratory Results   Urine Tests  Date/Time Received: June 08, 2010 9:21 AM   Routine Urinalysis   Glucose: negative   (Normal Range: Negative) Bilirubin: moderate   (Normal Range: Negative) Ketone: large (80)   (Normal Range: Negative) Spec. Gravity: >=1.030   (Normal Range: 1.003-1.035) Blood: trace-intact   (Normal Range: Negative) pH: 5.5   (Normal Range: 5.0-8.0) Protein: 30   (Normal Range:  Negative) Urobilinogen: 0.2   (Normal Range: 0-1) Nitrite: negative   (Normal Range: Negative) Leukocyte Esterace: negative   (Normal Range: Negative)    Date/Time Received: June 08, 2010   Wet [place] Source: vaginal WBC/hpf: 1-5 Bacteria/hpf: rare Clue cells/hpf: none Yeast/hpf: moderate Wet [place] KOH: Negative Trichomonas/hpf: none     X-ray  Procedure date:  06/04/2010  Findings:      Abdominal - diffuse gasous distention of bowel loops, including color and small bowel.  Radiographic pattern is most consistent with ileus.  Early changes of small bowel obstruction with residual gas in the colol cannot be excluded.  Suggest clinic correlation, and consider follow up abdominal radiographs  CT of Chest  Procedure date:  05/29/2010  Findings:      No pulmonary emboli small patchy areas of residual infiltrate in both lungs, most prominent in the right middle lobe just above the diaphragm  CT Abdomen/Pelvis  Procedure date:  05/29/2010  Findings:      Benign=appearing abdomen and pelvis  Echocardiogram  Procedure date:  05/21/2010  Findings:      left ventricular size is normal, wall thickness is normal. EF of 55-60%, wall motion is normal without any regional wall motion abnormalities   X-ray  Procedure date:  06/04/2010  Findings:      Abdominal - diffuse gasous distention of bowel loops, including color and small bowel.  Radiographic pattern is most consistent with ileus.  Early changes of small bowel obstruction with residual gas in the colol cannot be excluded.  Suggest clinic correlation, and consider follow up abdominal radiographs  CT of Chest  Procedure date:  05/29/2010  Findings:      No pulmonary emboli small patchy areas of residual infiltrate in both lungs, most prominent in the right middle lobe just above the diaphragm  CT Abdomen/Pelvis  Procedure date:  05/29/2010  Findings:      Benign=appearing abdomen and  pelvis  Echocardiogram  Procedure date:  05/21/2010  Findings:  left ventricular size is normal, wall thickness is normal. EF of 55-60%, wall motion is normal without any regional wall motion abnormalities

## 2010-06-14 NOTE — Letter (Signed)
Summary: [facility]  [facility]   Imported By: Arta Bruce 06/08/2010 15:11:48  _____________________________________________________________________  External Attachment:    Type:   Image     Comment:   External Document

## 2010-06-29 NOTE — Progress Notes (Signed)
[name], GENTZLER NO.:  [id number]  MEDICAL RECORD NO.:  [id number]          PATIENT TYPE:  INP  LOCATION:  1230                         FACILITY:  [facility]  PHYSICIAN:  Altha Harm, MDDATE OF BIRTH:  12-07-1970                                PROGRESS NOTE   TIME SEEN: 8:10 a.m.  SUBJECTIVE: [name] is a patient who came in with bilateral pneumonia last night.  The patient had an episode of hypoxia this morning, was seen by the overnight physician.  At that time of my examination, the patient is resting comfortably.  She states that she was feeling anxious earlier. The patient has received a dose of benzodiazepines and appears to be presently resting comfortably with decreased work of breathing. Presently, the patient is concerned about having her Valium scheduled as she normally takes it at home, stating she takes a dose in the morning, a dose with dinner and then one at bedtime.  Please note that her Valium has been scheduled on a t.i.d. basis.  I have also discussed with the patient the use of Foley catheter and she states that she normally has problems with urinary retention when she is acutely ill; however, the patient does not self cath at home.  I spoke with the nurse attending the patient who reports that the patient had approximately 500 mL of urine out immediately after the Foley catheter was placed.  PHYSICAL EXAMINATION: GENERAL:  The patient appears comfortable, in no acute respiratory distress and states that after having a very restless night, she would like a chance to sleep. VITAL SIGNS:  Temperature is 99.2, heart rate 99, respiratory rate 22, blood pressure 108/69, O2 saturations are 90% on 50% Ventimask.  HEENT: Normocephalic, atraumatic.  Pupils equally round and reactive to light. Oropharynx is dry, however, the patient has a Ventimask on with high airflow. NECK:  Trachea is midline.  No masses, no thyromegaly.  No JVD.   No carotid bruit. RESPIRATORY:  Normal respiratory effort without use of accessory muscles at this point.  She is breathing comfortably.  She does have decreased airflow in the bilateral lower lobes and rales heard in the bilateral lower lobes. ABDOMEN:  Obese, soft, nontender, nondistended.  No masses.  No hepatosplenomegaly. EXTREMITIES:  No clubbing, cyanosis or edema.  LABORATORY STUDIES: White blood cell count 11.6, hemoglobin 10.7, hematocrit 32.4, platelet count 248.  Sodium 138, potassium 3.8, chloride 110, bicarb 21, BUN 9, creatinine 0.73.  ASSESSMENT AND PLAN: This is a patient who has: 1. Bilateral lower lobe pneumonia and has acute hypoxic respiratory     failure.  The patient was started on steroids this morning as she     has a history also of asthma and chronic obstructive pulmonary     disease.  Presently, the patient does have an increased oxygen     requirement but is breathing comfortably.  I will continue the     patient on the steroids, on her antibiotics, and on her nebulizers.     We will assess the patient throughout the day and make decisions as  to whether or not her therapeutic approach needs to be escalated.     The patient has also received an anxiolytic.  By the patient's own     admission, she feels that much of her breathing problems this     morning was precipitated by anxiety.  We will assess the patient's     response to the anxiolytic and make necessary changes behind that. 2. Tourette's syndrome.  The patient also has a history of Tourette's     syndrome, is very concerned about having her Valium to prevent     Tourette's syndrome. 3. Chronic pain.  The patient does complain of chronic pain; however,     we explained to the patient that we need to be very judicious with     her pain medications at this time due to her respiratory status.     Altha Harm, MD     MAM/MEDQ  D:  05/23/2010  T:  05/23/2010  Job:   [id number]  Electronically Signed by Marthann Schiller MD on 06/28/2010 10:11:22 PM
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
## 2010-07-04 ENCOUNTER — Inpatient Hospital Stay (HOSPITAL_COMMUNITY): Admission: RE | Admit: 2010-07-04 | Payer: Medicaid Other | Source: Ambulatory Visit

## 2010-07-06 LAB — DIFFERENTIAL
Basophils Absolute: 0 10*3/uL (ref 0.0–0.1)
Basophils Relative: 0 % (ref 0–1)
Basophils Relative: 1 % (ref 0–1)
Eosinophils Absolute: 0.3 10*3/uL (ref 0.0–0.7)
Lymphocytes Relative: 28 % (ref 12–46)
Monocytes Absolute: 0.3 10*3/uL (ref 0.1–1.0)
Neutro Abs: 4.7 10*3/uL (ref 1.7–7.7)
Neutro Abs: 3 10*3/uL (ref 1.7–7.7)
Neutrophils Relative %: 66 % (ref 43–77)
Neutrophils Relative %: 45 % (ref 43–77)

## 2010-07-06 LAB — URINALYSIS, ROUTINE W REFLEX MICROSCOPIC
Bilirubin Urine: NEGATIVE
Glucose, UA: NEGATIVE mg/dL
Glucose, UA: NEGATIVE mg/dL
Hgb urine dipstick: NEGATIVE
Ketones, ur: NEGATIVE mg/dL
Ketones, ur: NEGATIVE mg/dL
Nitrite: NEGATIVE
Nitrite: NEGATIVE
Nitrite: NEGATIVE
Protein, ur: NEGATIVE mg/dL
Specific Gravity, Urine: 1.02 (ref 1.005–1.030)
Specific Gravity, Urine: 1.005 (ref 1.005–1.030)
Specific Gravity, Urine: 1.005 (ref 1.005–1.030)
Urobilinogen, UA: 0.2 mg/dL (ref 0.0–1.0)
Urobilinogen, UA: 0.2 mg/dL (ref 0.0–1.0)
pH: 5.5 (ref 5.0–8.0)
pH: 6 (ref 5.0–8.0)

## 2010-07-06 LAB — CBC
HCT: 35.4 % — ABNORMAL LOW (ref 36.0–46.0)
MCH: 30.2 pg (ref 26.0–34.0)
MCHC: 34.1 g/dL (ref 30.0–36.0)
MCHC: 33.6 g/dL (ref 30.0–36.0)
Platelets: 306 10*3/uL (ref 150–400)
Platelets: 330 10*3/uL (ref 150–400)
RBC: 4.06 MIL/uL (ref 3.87–5.11)
RDW: 17.5 % — ABNORMAL HIGH (ref 11.5–15.5)
WBC: 7.1 10*3/uL (ref 4.0–10.5)

## 2010-07-06 LAB — URINE MICROSCOPIC-ADD ON

## 2010-07-06 LAB — POCT I-STAT, CHEM 8
HCT: 41 % (ref 36.0–46.0)
Hemoglobin: 13.9 g/dL (ref 12.0–15.0)
Potassium: 4.4 mEq/L (ref 3.5–5.1)
Sodium: 140 mEq/L (ref 135–145)

## 2010-07-06 LAB — BASIC METABOLIC PANEL
BUN: 11 mg/dL (ref 6–23)
Calcium: 9.3 mg/dL (ref 8.4–10.5)
Creatinine, Ser: 0.92 mg/dL (ref 0.4–1.2)
GFR calc non Af Amer: >60 mL/min (ref >60)
Glucose, Bld: 96 mg/dL (ref 70–99)

## 2010-07-06 LAB — POCT PREGNANCY, URINE: Preg Test, Ur: NEGATIVE

## 2010-07-16 LAB — URINALYSIS, ROUTINE W REFLEX MICROSCOPIC
Bilirubin Urine: NEGATIVE
Glucose, UA: NEGATIVE mg/dL
Ketones, ur: NEGATIVE mg/dL
Specific Gravity, Urine: 1.005 (ref 1.005–1.030)
pH: 5.5 (ref 5.0–8.0)

## 2010-07-16 LAB — RAPID URINE DRUG SCREEN, HOSP PERFORMED
Benzodiazepines: POSITIVE — AB
Cocaine: POSITIVE — AB
Opiates: POSITIVE — AB

## 2010-07-18 ENCOUNTER — Encounter: Payer: Self-pay | Admitting: Nurse Practitioner

## 2010-07-18 ENCOUNTER — Encounter (INDEPENDENT_AMBULATORY_CARE_PROVIDER_SITE_OTHER): Payer: Self-pay | Admitting: Nurse Practitioner

## 2010-07-18 LAB — CONVERTED CEMR LAB: KOH Prep: NEGATIVE

## 2010-07-19 ENCOUNTER — Ambulatory Visit: Payer: Self-pay | Admitting: Pain Medicine

## 2010-07-25 LAB — RAPID URINE DRUG SCREEN, HOSP PERFORMED
Amphetamines: NOT DETECTED
Benzodiazepines: POSITIVE — AB
Cocaine: NOT DETECTED
Opiates: POSITIVE — AB
Tetrahydrocannabinol: NOT DETECTED

## 2010-07-25 LAB — DIFFERENTIAL
Basophils Absolute: 0 10*3/uL (ref 0.0–0.1)
Basophils Relative: 1 % (ref 0–1)
Eosinophils Absolute: 0.3 10*3/uL (ref 0.0–0.7)
Eosinophils Relative: 5 % (ref 0–5)
Lymphocytes Relative: 42 % (ref 12–46)
Lymphs Abs: 2.4 10*3/uL (ref 0.7–4.0)
Monocytes Absolute: 0.3 10*3/uL (ref 0.1–1.0)
Monocytes Relative: 6 % (ref 3–12)
Neutro Abs: 2.7 10*3/uL (ref 1.7–7.7)
Neutrophils Relative %: 47 % (ref 43–77)

## 2010-07-25 LAB — CBC
HCT: 35 % — ABNORMAL LOW (ref 36.0–46.0)
HCT: 37.2 % (ref 36.0–46.0)
Hemoglobin: 12.3 g/dL (ref 12.0–15.0)
MCHC: 33 g/dL (ref 30.0–36.0)
MCV: 90.7 fL (ref 78.0–100.0)
MCV: 91.3 fL (ref 78.0–100.0)
Platelets: 300 10*3/uL (ref 150–400)
Platelets: 319 10*3/uL (ref 150–400)
RBC: 4.1 MIL/uL (ref 3.87–5.11)
RDW: 16.1 % — ABNORMAL HIGH (ref 11.5–15.5)
RDW: 16.8 % — ABNORMAL HIGH (ref 11.5–15.5)
WBC: 5.8 10*3/uL (ref 4.0–10.5)
WBC: 8.2 10*3/uL (ref 4.0–10.5)

## 2010-07-25 LAB — COMPREHENSIVE METABOLIC PANEL
ALT: 10 U/L (ref 0–35)
AST: 14 U/L (ref 0–37)
Albumin: 4.1 g/dL (ref 3.5–5.2)
Albumin: 4.2 g/dL (ref 3.5–5.2)
Alkaline Phosphatase: 45 U/L (ref 39–117)
BUN: 14 mg/dL (ref 6–23)
BUN: 18 mg/dL (ref 6–23)
Calcium: 9.5 mg/dL (ref 8.4–10.5)
Calcium: 9.7 mg/dL (ref 8.4–10.5)
Chloride: 107 mEq/L (ref 96–112)
Creatinine, Ser: 0.85 mg/dL (ref 0.4–1.2)
GFR calc Af Amer: >60 mL/min (ref >60)
Glucose, Bld: 82 mg/dL (ref 70–99)
Potassium: 3.4 mEq/L — ABNORMAL LOW (ref 3.5–5.1)
Sodium: 137 mEq/L (ref 135–145)
Total Bilirubin: 0.1 mg/dL — ABNORMAL LOW (ref 0.3–1.2)
Total Protein: 7 g/dL (ref 6.0–8.3)
Total Protein: 7.2 g/dL (ref 6.0–8.3)

## 2010-07-25 LAB — URINALYSIS, ROUTINE W REFLEX MICROSCOPIC
Bilirubin Urine: NEGATIVE
Bilirubin Urine: NEGATIVE
Glucose, UA: NEGATIVE mg/dL
Hgb urine dipstick: NEGATIVE
Ketones, ur: NEGATIVE mg/dL
Ketones, ur: NEGATIVE mg/dL
Nitrite: NEGATIVE
Nitrite: NEGATIVE
Protein, ur: NEGATIVE mg/dL
Protein, ur: NEGATIVE mg/dL
Specific Gravity, Urine: 1.005 (ref 1.005–1.030)
Urobilinogen, UA: 0.2 mg/dL (ref 0.0–1.0)
Urobilinogen, UA: 0.2 mg/dL (ref 0.0–1.0)
pH: 7.5 (ref 5.0–8.0)

## 2010-07-25 LAB — LIPASE, BLOOD: Lipase: 37 U/L (ref 11–59)

## 2010-07-25 LAB — TSH: TSH: 5.48 u[IU]/mL — ABNORMAL HIGH (ref 0.350–4.500)

## 2010-07-25 NOTE — Assessment & Plan Note (Signed)
Summary: Acute - Candidiasis   Vital Signs:  Patient profile:   40 year old female Menstrual status:  hysterectomy 2008 Weight:      162.2 pounds BMI:     31.79 Temp:     97.5 degrees F oral Pulse rate:   83 / minute Pulse rhythm:   regular Resp:     20 per minute BP sitting:   106 / 73  (left arm) Cuff size:   regular  Vitals Entered By: Levon Hedger (July 18, 2010 8:45 AM)  Nutrition Counseling: Patient's BMI is greater than 25 and therefore counseled on weight management options. CC: possible yeast infection, vaginal discharge X 2 weeks Is Patient Diabetic? No Pain Assessment Patient in pain? yes      Onset of pain  Chronic  Does patient need assistance? Functional Status Self care Ambulation Normal   Visit Type:  Acute Primary Care Provider:  Lehman Prom FNP  CC:  possible yeast infection and vaginal discharge X 2 weeks.  History of Present Illness:  Pt into the office for an acute visit. Pt reports that she started to use metronidazole cream for the past 4 days due to thinking she had a bacterial infection. Now she presents with thick, white, discharge. Also she reports that she also had thick white discharge on her tongue.   Denies any pain in the mouth and some slight change in flavoring of foods.  Habits & Providers  Alcohol-Tobacco-Diet     Alcohol drinks/day: <1     Alcohol Counseling: to decrease amount and/or frequency of alcohol intake     Alcohol type: wine     Tobacco Status: current     Tobacco Counseling: to quit use of tobacco products     Cigarette Packs/Day: 0.5     Year Started: age 50  Exercise-Depression-Behavior     Does Patient Exercise: yes     Exercise Counseling: to improve exercise regimen     Type of exercise: walking     Exercise (avg: min/session): <30     Times/week: 3     Depression Counseling: further diagnostic testing and/or other treatment is indicated     Drug Use: past  Allergies (verified): 1)  !  Ketorolac Tromethamine (Ketorolac Tromethamine) 2)  ! Imitrex (Sumatriptan Succinate) 3)  ! Cipro (Ciprofloxacin Hcl) 4)  ! Valproic Acid (Valproic Acid) 5)  ! * Lithium  Review of Systems General:  Denies fever. ENT:  +tongue with thick white discharge. CV:  Denies chest pain or discomfort. Resp:  Denies cough. GI:  Denies abdominal pain, nausea, and vomiting. GU:  Complains of discharge.  Physical Exam  General:  alert.   Head:  normocephalic.   Mouth:  tongue - white coating but removable Lungs:  normal breath sounds.   Heart:  normal rate and regular rhythm.   Abdomen:  normal bowel sounds.   Msk:  up to the exam table Neurologic:  alert & oriented X3.   Psych:  Oriented X3.     Impression & Recommendations:  Problem # 1:  CANDIDIASIS (ICD-112.9) advised pt that dx is likely not thrush in her mouth but will give some nystatin swish just in case will also given fluconazole x 2 doses Orders: KOH/ WET [place] [phone])  Problem # 2:  TOBACCO ABUSE (ICD-305.1) advised cessation of tobacco  Problem # 3:  WEIGHT GAIN (ICD-783.1) will refer to nutrition to help pt make better food choices pt is very frustrated with weight loss Orders: Nutrition Referral (  Nutrition)  Complete Medication List: 1)  Qvar 40 Mcg/act Aers (Beclomethasone dipropionate) .... 2 puffs inhalation two times a day  **pharmacy - d/c advair** 2)  Proventil Hfa 108 (90 Base) Mcg/act Aers (Albuterol sulfate) .Marland Kitchen.. 1-2 inhalations every 6 hours as needed for shortness of breath 3)  Dulcolax Stool Softener 100 Mg Caps (Docusate sodium) .... One capsule by mouth three times a day 4)  Ibuprofen 800 Mg Tabs (Ibuprofen) .... One tablet by mouth two times a day as needed for pain 5)  Nexium 40 Mg Cpdr (Esomeprazole magnesium) .... One capsule by mouth daily 30 minutes before breakfast 6)  Polyethylene Glycol Powd (Polyethylene glycol 1450) .Marland Kitchen.. 1 capful mixed with 8 oz glass of water/juice daily 7)  Nu-iron 150 Mg  Caps (Polysaccharide iron complex) .... One tablet by mouth daily 8)  Cymbalta 30 Mg Cpep (Duloxetine hcl) .... One tablet by mouth daily 9)  Lamotrigine 25 Mg Tabs (Lamotrigine) .... One tablet by mouth at bedtime 10)  Vitamin B-1 100 Mg Tabs (Thiamine hcl) .... One tablet by mouth 11)  Zolpidem Tartrate 10 Mg Tabs (Zolpidem tartrate) .... One tablet by mouth nightly as needed for rest 12)  Gabapentin 300 Mg Caps (Gabapentin) .... One capsule by mouth two times a day for nerves 13)  Cyclobenzaprine Hcl 10 Mg Tabs (Cyclobenzaprine hcl) .... One tablet by mouth daily as needed for muscles 14)  Fluconazole 150 Mg Tabs (Fluconazole) .... One tablet by mouth x 1 dose and then repeat in 1 week 15)  Promethazine Hcl 25 Mg Tabs (Promethazine hcl) .... One tablet by mouth two times a day for stomach 16)  Nystatin 100000 Unit/ml Susp (Nystatin) .Marland Kitchen.. 1 teaspoon three times per day swish and spit  Patient Instructions: 1)  Will refer to nutritionalist at [facility] for weight loss techniques. 2)  You will need to find ways to increase your metabolism naturally. 3)  Follow up as needed Prescriptions: FLUCONAZOLE 150 MG TABS (FLUCONAZOLE) One tablet by mouth x 1 dose and then repeat in 1 week  #2 x 0   Entered and Authorized by:   Lehman Prom FNP   Signed by:   Lehman Prom FNP on 07/18/2010   Method used:   Print then Give to Patient   RxID:   [id number] NYSTATIN 100000 UNIT/ML SUSP (NYSTATIN) 1 teaspoon three times per day swish and spit  #130ml x 0   Entered and Authorized by:   Lehman Prom FNP   Signed by:   Lehman Prom FNP on 07/18/2010   Method used:   Print then Give to Patient   RxID:   [id number]    Orders Added: 1)  Est. Patient Level III [84166] 2)  KOH/ [place] [06301] 3)  Nutrition Referral [Nutrition]    Laboratory Results  Date/Time Received: July 18, 2010 9:35 AM   [facility] Source: vaginal WBC/hpf: 1-5 Bacteria/hpf: rare Clue cells/hpf:  none Yeast/hpf: few Wet [place] KOH: Negative Trichomonas/hpf: none

## 2010-07-25 NOTE — Letter (Signed)
Summary: NUTRITION REFERRAL  NUTRITION REFERRAL   Imported By: Arta Bruce 07/20/2010 15:32:34  _____________________________________________________________________  External Attachment:    Type:   Image     Comment:   External Document

## 2010-07-27 LAB — URINALYSIS, ROUTINE W REFLEX MICROSCOPIC
Nitrite: NEGATIVE
Protein, ur: NEGATIVE mg/dL
Specific Gravity, Urine: 1.012 (ref 1.005–1.030)
Urobilinogen, UA: 0.2 mg/dL (ref 0.0–1.0)

## 2010-07-27 LAB — URINE MICROSCOPIC-ADD ON

## 2010-07-29 LAB — CBC
HCT: 32.4 % — ABNORMAL LOW (ref 36.0–46.0)
HCT: 34 % — ABNORMAL LOW (ref 36.0–46.0)
HCT: 38.9 % (ref 36.0–46.0)
Hemoglobin: 10 g/dL — ABNORMAL LOW (ref 12.0–15.0)
Hemoglobin: 10.8 g/dL — ABNORMAL LOW (ref 12.0–15.0)
Hemoglobin: 11.4 g/dL — ABNORMAL LOW (ref 12.0–15.0)
Hemoglobin: 13.1 g/dL (ref 12.0–15.0)
MCHC: 33.3 g/dL (ref 30.0–36.0)
MCHC: 33.6 g/dL (ref 30.0–36.0)
MCV: 91.2 fL (ref 78.0–100.0)
MCV: 90.4 fL (ref 78.0–100.0)
Platelets: 339 10*3/uL (ref 150–400)
RBC: 3.29 MIL/uL — ABNORMAL LOW (ref 3.87–5.11)
RDW: 15.7 % — ABNORMAL HIGH (ref 11.5–15.5)
RDW: 15.4 % (ref 11.5–15.5)
WBC: 15.8 10*3/uL — ABNORMAL HIGH (ref 4.0–10.5)

## 2010-07-29 LAB — URINALYSIS, ROUTINE W REFLEX MICROSCOPIC
Nitrite: POSITIVE — AB
Protein, ur: 100 mg/dL — AB
Protein, ur: NEGATIVE mg/dL
Urobilinogen, UA: 0.2 mg/dL (ref 0.0–1.0)
Urobilinogen, UA: 4 mg/dL — ABNORMAL HIGH (ref 0.0–1.0)
pH: 6.5 (ref 5.0–8.0)

## 2010-07-29 LAB — CULTURE, BLOOD (ROUTINE X 2): Culture: NO GROWTH

## 2010-07-29 LAB — BASIC METABOLIC PANEL
BUN: 7 mg/dL (ref 6–23)
CO2: 30 mEq/L (ref 19–32)
CO2: 30 mEq/L (ref 19–32)
Chloride: 102 mEq/L (ref 96–112)
Chloride: 104 mEq/L (ref 96–112)
Chloride: 103 mEq/L (ref 96–112)
Creatinine, Ser: 0.83 mg/dL (ref 0.4–1.2)
Creatinine, Ser: 0.72 mg/dL (ref 0.4–1.2)
GFR calc Af Amer: >60 mL/min (ref >60)
GFR calc Af Amer: >60 mL/min (ref >60)
GFR calc non Af Amer: >60 mL/min (ref >60)
GFR calc non Af Amer: >60 mL/min (ref >60)
Glucose, Bld: 115 mg/dL — ABNORMAL HIGH (ref 70–99)
Glucose, Bld: 89 mg/dL (ref 70–99)
Potassium: 4 mEq/L (ref 3.5–5.1)
Potassium: 4.3 mEq/L (ref 3.5–5.1)
Potassium: 4.5 mEq/L (ref 3.5–5.1)
Potassium: 4.8 mEq/L (ref 3.5–5.1)
Sodium: 136 mEq/L (ref 135–145)
Sodium: 137 mEq/L (ref 135–145)
Sodium: 138 mEq/L (ref 135–145)

## 2010-07-29 LAB — DIFFERENTIAL
Eosinophils Relative: 14 % — ABNORMAL HIGH (ref 0–5)
Eosinophils Relative: 6 % — ABNORMAL HIGH (ref 0–5)
Lymphocytes Relative: 32 % (ref 12–46)
Lymphocytes Relative: 9 % — ABNORMAL LOW (ref 12–46)
Lymphs Abs: 1.5 10*3/uL (ref 0.7–4.0)
Lymphs Abs: 2.6 10*3/uL (ref 0.7–4.0)
Monocytes Absolute: 0.5 10*3/uL (ref 0.1–1.0)
Neutro Abs: 12.8 10*3/uL — ABNORMAL HIGH (ref 1.7–7.7)

## 2010-07-29 LAB — POCT I-STAT, CHEM 8
Calcium, Ion: 1.18 mmol/L (ref 1.12–1.32)
Chloride: 101 mEq/L (ref 96–112)
HCT: 36 % (ref 36.0–46.0)
Potassium: 4.5 mEq/L (ref 3.5–5.1)

## 2010-07-29 LAB — RAPID URINE DRUG SCREEN, HOSP PERFORMED: Tetrahydrocannabinol: NOT DETECTED

## 2010-07-29 LAB — HEMOGLOBIN AND HEMATOCRIT, BLOOD
HCT: 34.7 % — ABNORMAL LOW (ref 36.0–46.0)
Hemoglobin: 11.8 g/dL — ABNORMAL LOW (ref 12.0–15.0)
Hemoglobin: 11.8 g/dL — ABNORMAL LOW (ref 12.0–15.0)

## 2010-07-29 LAB — URINE CULTURE
Culture: NO GROWTH
Culture: NO GROWTH

## 2010-07-29 LAB — URINE MICROSCOPIC-ADD ON

## 2010-07-29 LAB — CREATININE, FLUID (PLEURAL, PERITONEAL, JP DRAINAGE)

## 2010-07-30 LAB — URINALYSIS, ROUTINE W REFLEX MICROSCOPIC
Bilirubin Urine: NEGATIVE
Glucose, UA: NEGATIVE mg/dL
Glucose, UA: NEGATIVE mg/dL
Hgb urine dipstick: NEGATIVE
Nitrite: NEGATIVE
Protein, ur: NEGATIVE mg/dL
Specific Gravity, Urine: 1.014 (ref 1.005–1.030)
pH: 7.5 (ref 5.0–8.0)

## 2010-07-30 LAB — BASIC METABOLIC PANEL
BUN: 20 mg/dL (ref 6–23)
Chloride: 103 mEq/L (ref 96–112)
Creatinine, Ser: 0.9 mg/dL (ref 0.4–1.2)
Glucose, Bld: 86 mg/dL (ref 70–99)

## 2010-07-30 LAB — RAPID URINE DRUG SCREEN, HOSP PERFORMED
Barbiturates: NOT DETECTED
Opiates: NOT DETECTED

## 2010-07-30 LAB — URINE MICROSCOPIC-ADD ON

## 2010-07-31 LAB — URINALYSIS, ROUTINE W REFLEX MICROSCOPIC
Glucose, UA: NEGATIVE mg/dL
Nitrite: NEGATIVE
Protein, ur: NEGATIVE mg/dL
Urobilinogen, UA: 0.2 mg/dL (ref 0.0–1.0)

## 2010-07-31 LAB — PREGNANCY, URINE: Preg Test, Ur: NEGATIVE

## 2010-07-31 LAB — URINE CULTURE: Colony Count: >=100000

## 2010-07-31 LAB — DIFFERENTIAL
Basophils Relative: 1 % (ref 0–1)
Eosinophils Absolute: 0.2 10*3/uL (ref 0.0–0.7)
Monocytes Relative: 7 % (ref 3–12)
Neutro Abs: 5.2 10*3/uL (ref 1.7–7.7)
Neutrophils Relative %: 53 % (ref 43–77)

## 2010-07-31 LAB — COMPREHENSIVE METABOLIC PANEL
ALT: 14 U/L (ref 0–35)
Alkaline Phosphatase: 78 U/L (ref 39–117)
BUN: 11 mg/dL (ref 6–23)
CO2: 25 mEq/L (ref 19–32)
Calcium: 9.7 mg/dL (ref 8.4–10.5)
GFR calc non Af Amer: >60 mL/min (ref >60)
Glucose, Bld: 89 mg/dL (ref 70–99)
Potassium: 3.2 mEq/L — ABNORMAL LOW (ref 3.5–5.1)
Total Protein: 7.9 g/dL (ref 6.0–8.3)

## 2010-07-31 LAB — URINE MICROSCOPIC-ADD ON

## 2010-07-31 LAB — CBC
HCT: 38.6 % (ref 36.0–46.0)
Hemoglobin: 13.1 g/dL (ref 12.0–15.0)
MCHC: 34 g/dL (ref 30.0–36.0)
RBC: 4.22 MIL/uL (ref 3.87–5.11)
RDW: 15.7 % — ABNORMAL HIGH (ref 11.5–15.5)

## 2010-07-31 LAB — RAPID URINE DRUG SCREEN, HOSP PERFORMED
Barbiturates: NOT DETECTED
Benzodiazepines: POSITIVE — AB

## 2010-08-09 ENCOUNTER — Ambulatory Visit: Payer: Medicaid Other | Admitting: *Deleted

## 2010-08-16 ENCOUNTER — Emergency Department (HOSPITAL_COMMUNITY)
Admission: EM | Admit: 2010-08-16 | Discharge: 2010-08-16 | Disposition: A | Discharge status: Home / Self Care | Payer: Medicaid Other | Attending: Emergency Medicine | Admitting: Emergency Medicine

## 2010-08-16 DIAGNOSIS — R112 Nausea with vomiting, unspecified: Secondary | ICD-10-CM | POA: Insufficient documentation

## 2010-08-16 DIAGNOSIS — H53149 Visual discomfort, unspecified: Secondary | ICD-10-CM | POA: Insufficient documentation

## 2010-08-16 DIAGNOSIS — F411 Generalized anxiety disorder: Secondary | ICD-10-CM | POA: Insufficient documentation

## 2010-08-16 DIAGNOSIS — K219 Gastro-esophageal reflux disease without esophagitis: Secondary | ICD-10-CM | POA: Insufficient documentation

## 2010-08-16 DIAGNOSIS — R51 Headache: Secondary | ICD-10-CM | POA: Insufficient documentation

## 2010-08-17 ENCOUNTER — Emergency Department (HOSPITAL_COMMUNITY)
Admission: EM | Admit: 2010-08-17 | Discharge: 2010-08-17 | Disposition: A | Discharge status: Home / Self Care | Payer: Medicaid Other | Attending: Emergency Medicine | Admitting: Emergency Medicine

## 2010-08-17 DIAGNOSIS — Z79899 Other long term (current) drug therapy: Secondary | ICD-10-CM | POA: Insufficient documentation

## 2010-08-17 DIAGNOSIS — K219 Gastro-esophageal reflux disease without esophagitis: Secondary | ICD-10-CM | POA: Insufficient documentation

## 2010-08-17 DIAGNOSIS — M542 Cervicalgia: Secondary | ICD-10-CM | POA: Insufficient documentation

## 2010-08-17 DIAGNOSIS — F411 Generalized anxiety disorder: Secondary | ICD-10-CM | POA: Insufficient documentation

## 2010-08-17 DIAGNOSIS — F952 Tourette's disorder: Secondary | ICD-10-CM | POA: Insufficient documentation

## 2010-08-17 DIAGNOSIS — M62838 Other muscle spasm: Secondary | ICD-10-CM | POA: Insufficient documentation

## 2010-08-17 DIAGNOSIS — R51 Headache: Secondary | ICD-10-CM | POA: Insufficient documentation

## 2010-08-17 LAB — BASIC METABOLIC PANEL
CO2: 24 mEq/L (ref 19–32)
Calcium: 9.3 mg/dL (ref 8.4–10.5)
Creatinine, Ser: 0.93 mg/dL (ref 0.4–1.2)
GFR calc non Af Amer: >60 mL/min (ref >60)
Glucose, Bld: 104 mg/dL — ABNORMAL HIGH (ref 70–99)
Sodium: 142 mEq/L (ref 135–145)

## 2010-08-17 LAB — CBC
HCT: 34.5 % — ABNORMAL LOW (ref 36.0–46.0)
MCH: 30.3 pg (ref 26.0–34.0)
MCHC: 32.8 g/dL (ref 30.0–36.0)
RBC: 3.73 MIL/uL — ABNORMAL LOW (ref 3.87–5.11)
RDW: 14.8 % (ref 11.5–15.5)
WBC: 10.6 10*3/uL — ABNORMAL HIGH (ref 4.0–10.5)

## 2010-08-17 LAB — URINALYSIS, ROUTINE W REFLEX MICROSCOPIC
Glucose, UA: NEGATIVE mg/dL
Protein, ur: NEGATIVE mg/dL
Specific Gravity, Urine: 1.014 (ref 1.005–1.030)
pH: 6 (ref 5.0–8.0)

## 2010-08-17 LAB — DIFFERENTIAL
Eosinophils Absolute: 0.1 10*3/uL (ref 0.0–0.7)
Lymphs Abs: 2.8 10*3/uL (ref 0.7–4.0)
Neutro Abs: 7 10*3/uL (ref 1.7–7.7)
Neutrophils Relative %: 66 % (ref 43–77)

## 2010-08-18 ENCOUNTER — Ambulatory Visit: Payer: Self-pay | Admitting: Pain Medicine

## 2010-08-20 ENCOUNTER — Emergency Department (HOSPITAL_COMMUNITY)
Admission: EM | Admit: 2010-08-20 | Discharge: 2010-08-20 | Disposition: A | Discharge status: Home / Self Care | Payer: Medicaid Other | Attending: Emergency Medicine | Admitting: Emergency Medicine

## 2010-08-20 DIAGNOSIS — Z760 Encounter for issue of repeat prescription: Secondary | ICD-10-CM | POA: Insufficient documentation

## 2010-08-20 DIAGNOSIS — F411 Generalized anxiety disorder: Secondary | ICD-10-CM | POA: Insufficient documentation

## 2010-08-20 DIAGNOSIS — F988 Other specified behavioral and emotional disorders with onset usually occurring in childhood and adolescence: Secondary | ICD-10-CM | POA: Insufficient documentation

## 2010-08-20 DIAGNOSIS — M79609 Pain in unspecified limb: Secondary | ICD-10-CM | POA: Insufficient documentation

## 2010-08-20 DIAGNOSIS — F952 Tourette's disorder: Secondary | ICD-10-CM | POA: Insufficient documentation

## 2010-08-20 DIAGNOSIS — IMO0002 Reserved for concepts with insufficient information to code with codable children: Secondary | ICD-10-CM | POA: Insufficient documentation

## 2010-08-20 DIAGNOSIS — M549 Dorsalgia, unspecified: Secondary | ICD-10-CM | POA: Insufficient documentation

## 2010-08-20 DIAGNOSIS — G8929 Other chronic pain: Secondary | ICD-10-CM | POA: Insufficient documentation

## 2010-08-21 ENCOUNTER — Emergency Department (HOSPITAL_COMMUNITY)
Admission: EM | Admit: 2010-08-21 | Discharge: 2010-08-21 | Disposition: A | Discharge status: Home / Self Care | Payer: Medicaid Other | Attending: Emergency Medicine | Admitting: Emergency Medicine

## 2010-08-21 ENCOUNTER — Emergency Department (HOSPITAL_COMMUNITY): Payer: Medicaid Other

## 2010-08-21 DIAGNOSIS — G8929 Other chronic pain: Secondary | ICD-10-CM | POA: Insufficient documentation

## 2010-08-21 DIAGNOSIS — F952 Tourette's disorder: Secondary | ICD-10-CM | POA: Insufficient documentation

## 2010-08-21 DIAGNOSIS — Z79899 Other long term (current) drug therapy: Secondary | ICD-10-CM | POA: Insufficient documentation

## 2010-08-21 DIAGNOSIS — F411 Generalized anxiety disorder: Secondary | ICD-10-CM | POA: Insufficient documentation

## 2010-08-21 DIAGNOSIS — R0602 Shortness of breath: Secondary | ICD-10-CM | POA: Insufficient documentation

## 2010-08-21 DIAGNOSIS — J189 Pneumonia, unspecified organism: Secondary | ICD-10-CM | POA: Insufficient documentation

## 2010-08-24 ENCOUNTER — Ambulatory Visit: Payer: Medicaid Other | Admitting: *Deleted

## 2010-08-24 IMAGING — CT CT ABDOMEN W/O CM
2 of 4 series · 16 of 46 positions shown, 18 images · non-contrast
Comparison: 04/08/2007

CT ABDOMEN

CLINICAL DATA: Right flank pain, history kidney stones

CT ABDOMEN AND PELVIS WITHOUT CONTRAST
TECHNIQUE: Multidetector CT imaging of the abdomen and pelvis was
performed following the standard
protocol without intravenous contrast. Sagittal and coronal MPR
images reconstructed from axial data set.

[Series 2: 130 stone 5.0 b40f st · axial · 0.61mm/px · z∈[+844,+1154]mm · 13 of 74 slices shown, 15 images]
[im 6/74  soft-tissue]
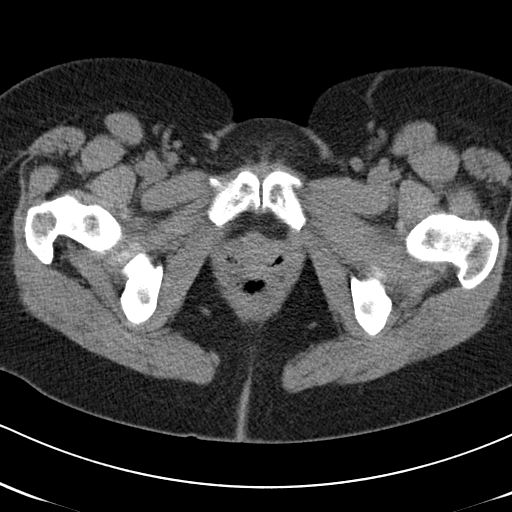
[im 6/74  bone]
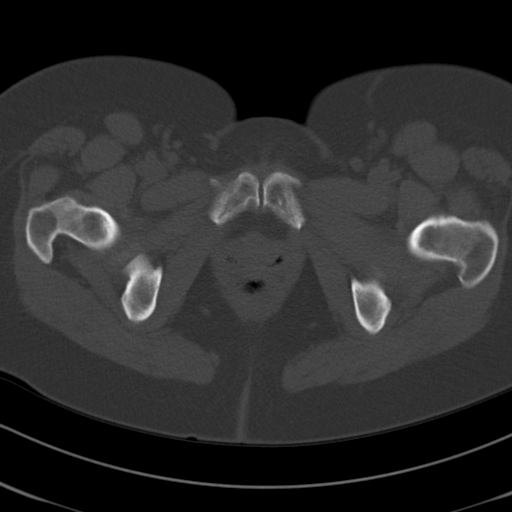
[im 11/74  soft-tissue]
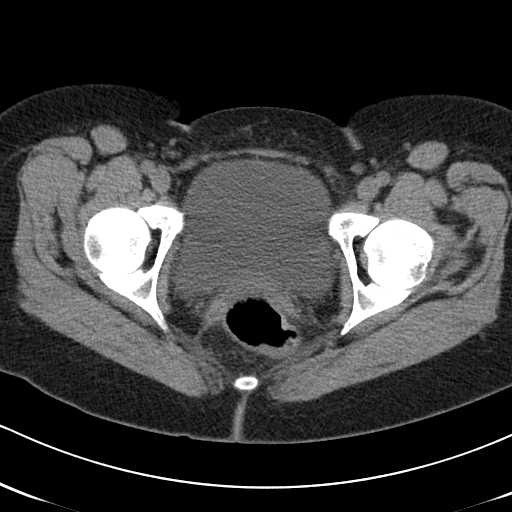
[im 16/74  soft-tissue]
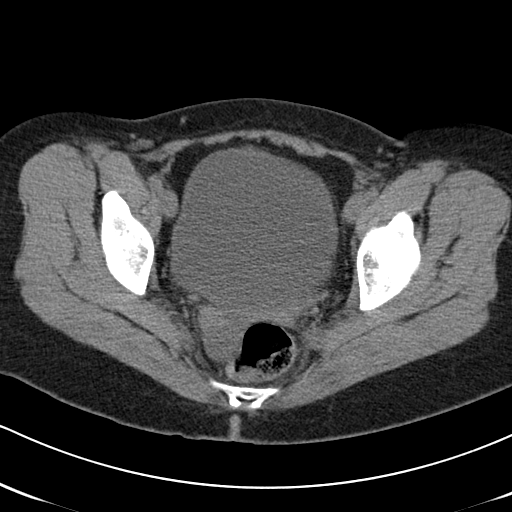
[im 21/74  soft-tissue]
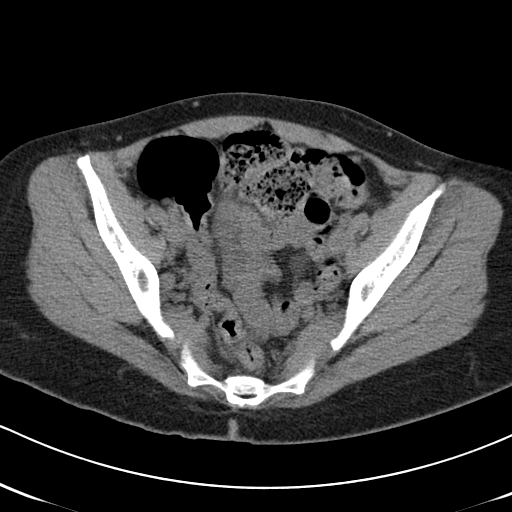
[im 27/74  soft-tissue]
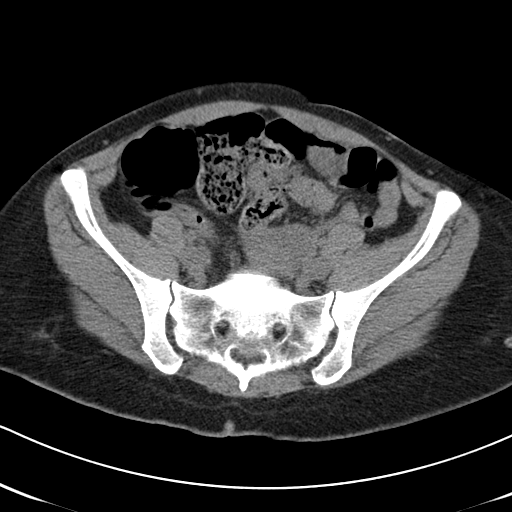
[im 32/74  soft-tissue]
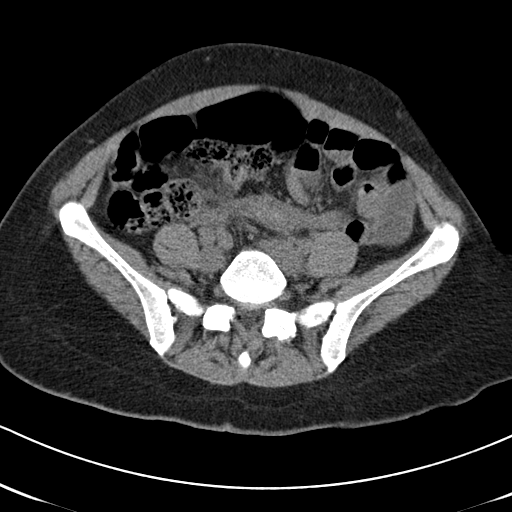
[im 37/74  soft-tissue]
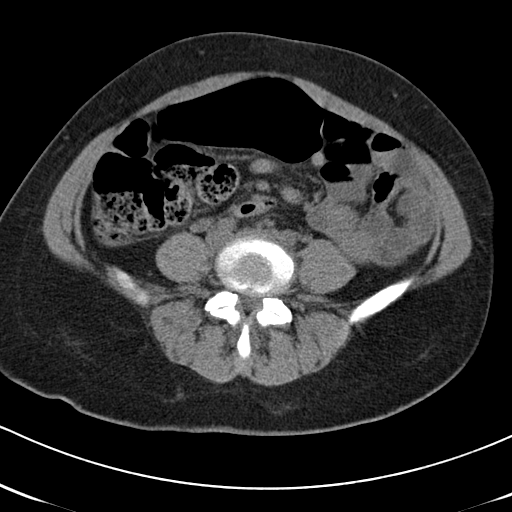
[im 42/74  soft-tissue]
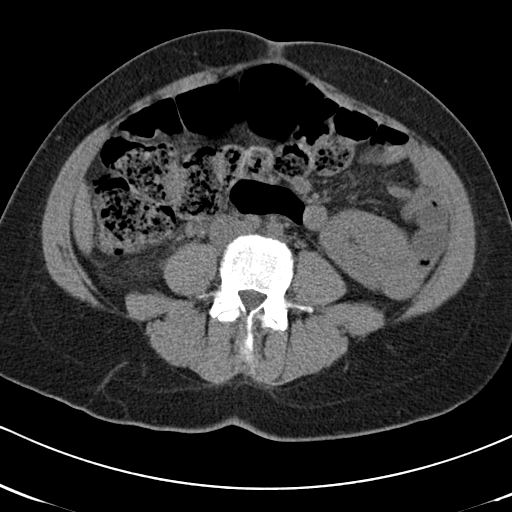
[im 47/74  soft-tissue]
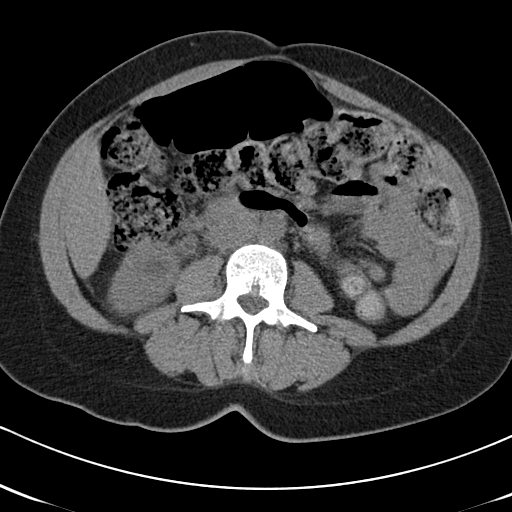
[im 47/74  bone]
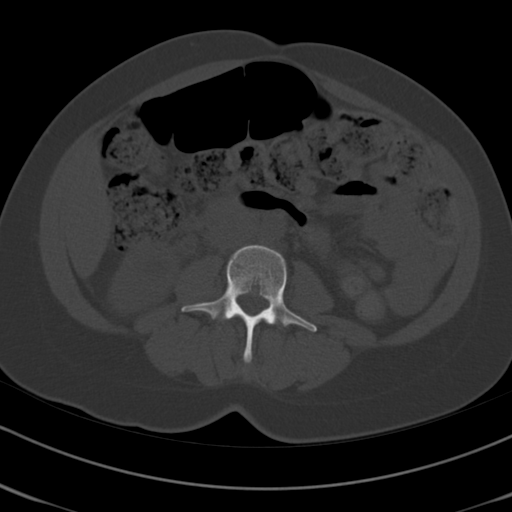
[im 53/74  soft-tissue]
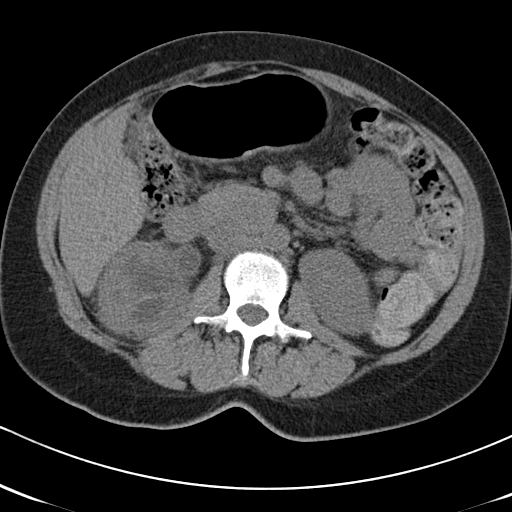
[im 58/74  soft-tissue]
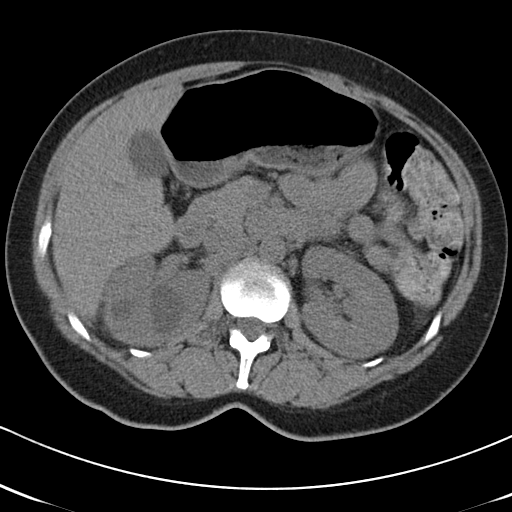
[im 63/74  soft-tissue]
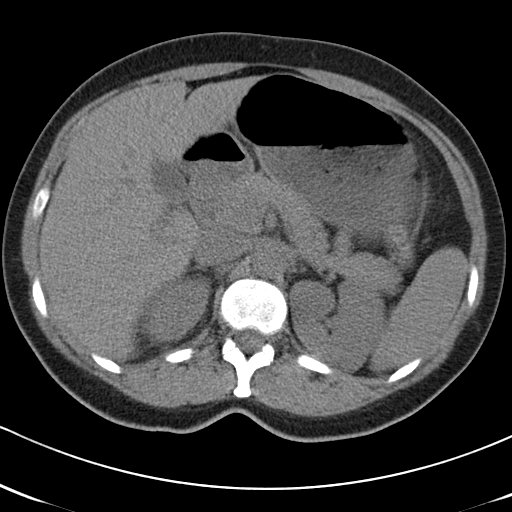
[im 68/74  soft-tissue]
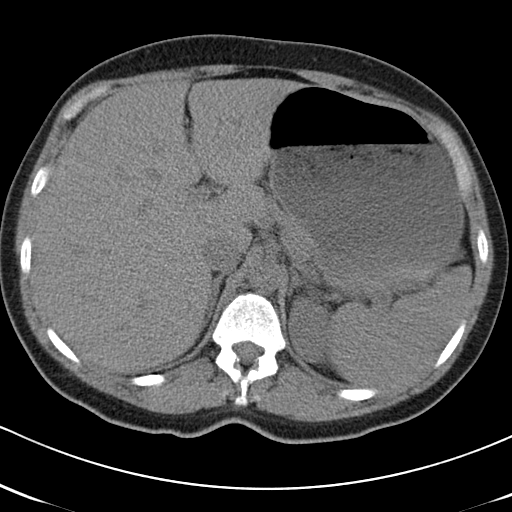

[Series 602: <mpr thick range> · coronal · 0.76mm/px · 3 of 71 slices shown]
[im 24/71  soft-tissue]
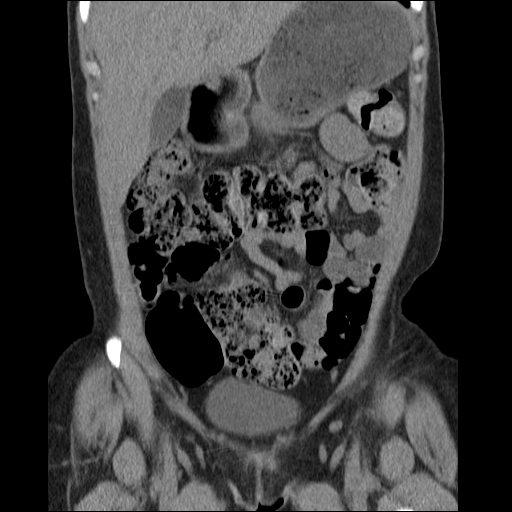
[im 32/71  soft-tissue]
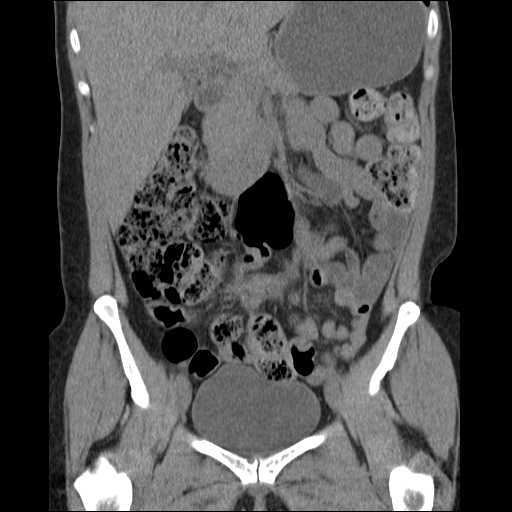
[im 39/71  soft-tissue]
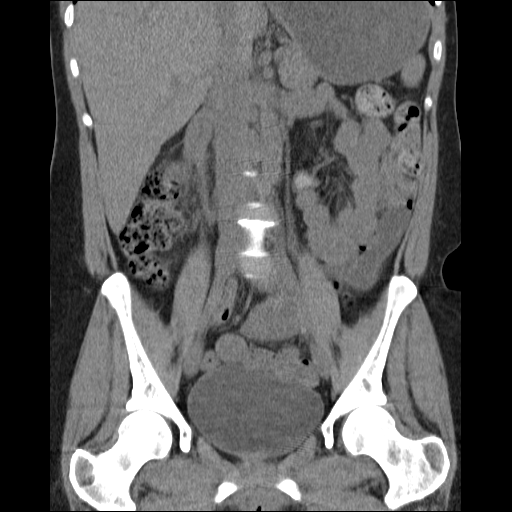

[16 of 46 positions shown; findings below may reference images not displayed]

FINDINGS: Lung bases clear.
Visualized portions of liver, spleen, pancreas, adrenal glands, and
left kidney normal.
Right hydronephrosis with thickening of wall of the renal pelvis
and proximal right ureter.
Tiny 1-2 mm diameter proximal right ureteral calculus again
identified image 33.
Findings suggest chronic obstruction of the right kidney, question
chronic infection versus less likely xanthogranulomatous
pyelonephritis, though this lacks the typical obstructing renal
pelvic calculus.
Minimal stranding of perinephric fat planes right kidney.
Large and small bowel loops in upper abdomen unremarkable, stool in
the noted throughout colon.
No upper abdominal mass or adenopathy.
IMPRESSION: Chronic right hydronephrosis with thickening of wall of renal
pelvis and chronic visualization of a 1-2 mm diameter proximal
right ureteral calculus, raising question of chronic obstruction
with infection or less likely xanthogranulomatous pyelonephritis.
No other upper abdominal abnormalities.

CT PELVIS
FINDINGS: Bladder and distal ureters normal.
Small amount nonspecific free pelvic fluid.
Minimal mesenteric edema in the mid pelvis, uncertain etiology and
significance, images 43 through 45.
Mobile cecum, traversing midline into right pelvis adjacent to the
dome of urinary bladder.
Appendix is not localized.
Redundant sigmoid colon extending into the right pelvis.
No hernia or free air.
Bones unremarkable.
IMPRESSION: No distal ureteral calcification or dilatation.

Small amount of nonspecific free pelvic fluid.
Small area inflammatory change noted in upper central pelvis of
uncertain etiology.
The appendix is not discretely localized, with cecum noted to be
mobile and across the midline in the pelvis.
If there is clinical concern for appendicitis, consider follow-up
CT abdomen and pelvis with IV and oral contrast to better assess.

## 2010-09-05 NOTE — Discharge Summary (Signed)
NAMEGERALDEAN, WALEN NO.:  [id number]   MEDICAL RECORD NO.:  [id number]          PATIENT TYPE:  INP   LOCATION:  1412                         FACILITY:  [facility]   PHYSICIAN:  Heloise Purpura, MD      DATE OF BIRTH:  1971/04/11   DATE OF ADMISSION:  12/01/2008  DATE OF DISCHARGE:  12/03/2008                               DISCHARGE SUMMARY   ADMISSION DIAGNOSES:  1. Right ureteral stricture.  2. Right flank pain.   DISCHARGE DIAGNOSES:  1. Right ureteral stricture.  2. Right flank pain.   PROCEDURES:  1. Cystoscopy.  2. Right retrograde pyelography  3. Right ureteral stent placement.  4. Right robotic assisted laparoscopic ureteroureterostomy.   HISTORY AND PHYSICAL:  For full details please see admission history and  physical.  Briefly, Ms. Reader is a 40 year old female with a history  of longstanding right hydronephrosis and a short right ureteral  stricture which has been thought to be related to a previous impacted  proximal ureteral calculus.  She has undergone an endoureterotomy and  subsequently developed a recurrent stricture.  She also has chronic  right-sided flank pain and it has been unclear how much of this is  related to her kidney and ureteral obstruction.  After discussion  regarding management options for treatment and after her failed  endoscopic procedure she did elect to proceed with a  ureteroureterostomy.   HOSPITAL COURSE:  On December 01, 2008, she was taken to the operating  room where she underwent the above named procedures which she tolerated  well without complications.  Postoperatively she was able to be  transferred to a regular hospital room following recovery from  anesthesia.  She was able to begin ambulation that evening with lots of  encouragement.  She remained hemodynamically stable.  Her postoperative  hematocrit was 34.7.  On the morning of postoperative day #1 her  hematocrit was also found to be stable at 35.7.  Her  kidney function was  also normal as noted by her creatinine of 0.72 postoperatively and again  on postoperative day #1 with a creatinine of 0.67.  She was able to  begin a clear liquid diet on postoperative day #1 which she tolerated  without difficulty.  She was able to be transitioned to a regular diet  over the course of the next 24-48 hours.  She began ambulating on  postoperative day #1 with minimal assistance or encouragement.  She was  also able to be transitioned to oral pain medications on postoperative  day #1.  She maintained excellent urine output postoperatively.  Therefore on postoperative day #1 her Foley catheter was removed.  She  had minimal output from her perinephric drain and it was sent to check  for a creatinine level and found to be consistent with serum at 0.5.  Therefore on postoperative day #2 her perinephric drain was removed.  She was reevaluated on the afternoon of postoperative day #2 and found  to be stable with pain somewhat under control, tolerating her diet and  ambulating without difficulty.  Therefore, she was felt to  be stable for  discharge as she had met all discharge criteria.   DISPOSITION:  Home.   DISCHARGE MEDICATIONS:  She was instructed to resume all regular home  medications consisting of Xanax, Prozac, Remeron.  In addition she was  given a prescription for oxycodone to use as needed for pain and told to  use Colace as a stool softener.  She was also given a prescription for  Flomax and Ditropan to use as needed for spasms and pain related to her  stent.   DISCHARGE INSTRUCTIONS:  She was instructed to be ambulatory but  specifically told to refrain from any heavy lifting, strenuous activity  or driving.  She was also instructed to continue with her regular home  diet.   FOLLOWUP:  She will follow up in 10-14 days for further postoperative  evaluation.      Delia Chimes, NP      Heloise Purpura, MD  Electronically  Signed    MA/MEDQ  D:  12/03/2008  T:  12/03/2008  Job:  [id number]

## 2010-09-05 NOTE — Consult Note (Signed)
[name], [name] NO.:  [id number]   MEDICAL RECORD NO.:  [id number]          PATIENT TYPE:  INP   LOCATION:  1410                         FACILITY:  [facility]   PHYSICIAN:  Coralyn Helling, MD        DATE OF BIRTH:  1970/05/20   DATE OF CONSULTATION:  06/05/2007  DATE OF DISCHARGE:                                 CONSULTATION   REASON FOR CONSULTATION:  Bilateral pulmonary infiltrates.   HISTORY OF PRESENT ILLNESS:  This is a 40 year old white female who  presented on May 31, 2007 with chief complaint of sudden onset of  dyspnea.  She reported she was at her usual activity status without  exertional dyspnea up until early morning of May 31, 2007 when she  awoke to a typical morning, was getting dressed, and realized sudden  occurrence of acute dyspnea which she described as feeling vapor  locked.  She states that she has baseline exertional fatigue, but  usually no exertional dyspnea.  The morning that this occurred on the  7th she tried a rescue beta agonist twice without relief.  She denied  significant cough.  She denied any hemoptysis.  She has reported  increased sinus drainage and congestion over the last five months.  However, no other significant upper respiratory symptoms.  She reports  her dyspnea since admission is intermittent at times.  She reports at  times she tolerates activity without shortness of breath, and then at  other times she reports significant dyspnea.  Again she denies cough,  she denies fever, she denies sick exposure.  She has had no recent URI  symptoms, no nausea or vomiting.  She reports occasional lower extremity  swelling, no abdominal bloating.  She does have a positive polysubstance  abuse history which does include alcoholism, cocaine use, and multiple  narcotics and benzodiazepines.  She did have a history of snorting  cocaine approximately two days prior to onset of symptoms.  However, has  not smoked cocaine for over  six months per her recollection.  She  reports persistent chest discomfort primarily with deep breath.  Notes  it is primarily in the epigastric region.  However, it radiates over the  entire anterior chest wall mostly described as significant chest  tightness.  She does report nighttime sweats.  However, this is  something that has been chronic because one of the medications she is on  for Tourette's.  She has had no tuberculosis exposures to her knowledge,  and reports she has been monogamous in her current relationship.   PAST MEDICAL HISTORY:  Asthma, allergic sinusitis, obstructive sleep  apnea, gastroesophageal reflux disease, chronic back pain, degenerative  disk disease, anxiety, depression, history of nephrolithiasis with  chronic obstruction and multiple stents and urological procedures,  migraines, Tourette's syndrome, polysubstance abuse including alcohol,  cocaine, narcotics, and benzodiazepines.  Additionally history of  abnormal uterine bleeding.  She has also had a history of MRSA.   FAMILY HISTORY:  Negative for coronary artery disease, negative for lung  disease.  Has had leukemia in the history of family.  SOCIAL HISTORY:  She has lived in [place] all of her life.  She  is currently unemployed secondary to disability from degenerative joint  disease, chronic pain, and Tourette's syndrome.  She has had no unusual  travels.  She has no pets at her home.  She is an active smoker.  She  also has active history of polysubstance abuse which again includes  cocaine which was noted to last approximately two days prior to  admission, history of crack cocaine, but this has been in excess of 5-6  months.  Interestingly though she does report that she and her fiance  recently moved to her current apartment approximately five months ago,  and of note since that time they have noticed a black gritty sediment  blowing out of the air conditioning vents.  She notes that she  has had  chronic postnasal drip since moving to this apartment, and her child has  had increased frequency of upper respiratory tract symptomatology such  as runny nose and cough.  She was originally on the ground floor of this  apartment for which she was moved because there is significant mold  noted in the same building.  She reports similar-appearing growth in the  current apartment.   ALLERGIES:  TORADOL, DEPAKOTE, CIPRO, IMITREX, and BENADRYL.   CURRENT MEDICATIONS:  Ventolin, Xanax, Tessalon, Wellbutrin, Lovenox,  Prozac, Atrovent, Avelox, Protonix, potassium and multiple p.r.n.  medications.   REVIEW OF SYSTEMS:  Per HPI.   PHYSICAL EXAMINATION:  VITAL SIGNS:  Temperature 98.4, heart rate 79-90,  blood pressure 89-92/50's, respirations 17-20, saturations 93% on room  air, I&O -1.6 liters.  GENERAL:  This is a 40 year old white female who is currently not in  acute distress with multiple musculoskeletal and pain complaints as well  as complaints of anxiety.  HEENT:  She is normocephalic, sclerae nonicteric, no JVD or adenopathy.  PULMONARY:  Markedly decreased in the bases.  Pulmonary exam limited  secondary to the patient's inability to take full inspiratory breath in  the setting of pain.  CARDIAC:  Regular rate and rhythm without murmur, rub or gallop.  EXTREMITIES:  Brisk cap refill.  No edema.  ABDOMEN:  Nontender.  NEUROLOGICAL:  Grossly intact.   LABORATORY DATA:  Echocardiogram currently pending.  CT of chest with  diffuse ground glass infiltrates, positive air bronchograms.   BNP was obtained on June 05, 2007 which was less than 30.  Sodium  135, potassium 4.2, chloride 98, CO2 30, BUN 16, creatinine 0.79,  glucose 97.  CK-MB is negative, troponin I negative, D-dimer negative.   IMPRESSION/PLAN:  Acute dyspnea secondary to diffuse pulmonary  infiltrates.  Multiple potential causes including drug-induced  inflammatory process, inflammatory process in  general, atypical  pneumonia, fungal infection, pulmonary edema,, although doubt edema in  the setting of normal BNP.   PLAN/RECOMMENDATIONS:  Currently wean oxygen as tolerated, continue  empiric Avelox, check HIV test, check ESR.  I have scheduled Ms. Klassen  for fiberoptic bronchoscopy with biopsy on June 06, 2007 at 1:30  p.m. for further evaluation of diffuse pulmonary infiltrates.   Thank you very much for this interesting consultation.      [name], NP      Coralyn Helling, MD  Electronically Signed   PB/MEDQ  D:  06/05/2007  T:  06/06/2007  Job:  [id number]   cc:   Elliot Cousin, M.D.

## 2010-09-05 NOTE — Consult Note (Signed)
NAMESTEPHANEE, BARCOMB NO.:  [id number]   MEDICAL RECORD NO.:  [id number]          PATIENT TYPE:  INP   LOCATION:  1313                         FACILITY:  [facility]   PHYSICIAN:  Sigmund I. Patsi Sears, M.D.DATE OF BIRTH:  1970/12/15   DATE OF CONSULTATION:  01/18/2008  DATE OF DISCHARGE:                                 CONSULTATION   SUBJECTIVE:  This is a 40 year old female, admitted 01/15/2008, with  right flank pain.  She has been known to be afebrile, and have no  elevation of white count.  CT was suggestive of perinephric inflammation  and shows chronic hydronephrosis changes.  There is a question of a 1 mm  calcification in the right upper ureter (tentative).  There is also some  question of edema of right renal pelvis.  The patient's pain was not  controlled in the emergency room, she was admitted to [facility] for  more evaluation.  She has been treated with Rocephin, pending urine  culture, which is unavailable at the time of dictation.   PAST MEDICAL HISTORY:  1. Vaginal sling and rectocele repair and cystourethropexy per Dr.      Jacquelyne Balint 1 year ago.  2. Polysubstance abuse.  3. History of bilateral pneumonia  4. GERD.  5. Asthma.  6. Degenerative disk disease.  7. Depression.  8. Migraines.  9. Tourette syndrome.   CURRENT MEDICATIONS:  1. Prozac 20 mg twice a day.  2. Wellbutrin 1 per day.  3. Xanax 1 mg every 3 hours.  4. Nexium 40 mg a day.  5. Orap 1/2 of 1 mg per day.  6. ProAir p.r.n.  7. Advair twice daily.   ALLERGIES:  1. ANTIHISTAMINES.  2. CIPRO.  3. DEPAKOTE.  4. IMITREX.  5. TORADOL.   SOCIAL HISTORY:  The patient smokes half a pack of cigarettes per day.  She denies alcohol or current illicit drug use.  The patient is  unemployed, seeking disability.   FAMILY HISTORY:  Sister with hypertension, and family-wide depression.   REVIEW OF SYSTEMS:  CONSTITUTIONAL:  Significant for chronic right flank  pain, general  malaise.  Remaining review of systems is otherwise  unremarkable.   PHYSICAL EXAMINATION:  GENERAL:  Shows a well-developed female, alert,  oriented.  She complains of chronic right flank pain.  NECK:  Supple,  nontender.  No nodes.  CHEST:  Clear to P&A.  ABDOMEN:  Soft.  Decrease in bowel sounds.  There is right flank pain to  percussion.  GENITOURINARY:  Shows normal BUS.  EXTREMITIES:  No cyanosis or edema.  PSYCHOLOGIC:  Normal orientation to time, person, place.   LABORATORIES:  Show white blood count down 8600, creatinine 0.67.  Urinalysis shows positive nitrite, positive leukocytes, 11 to 20 red  cells, 4+ bacteria.   ASSESSMENT:  Chronic right hydronephrosis, with chronic right flank  pain.  She is being treated with Rocephin for apparent urinary tract  infection and pyelonephritis.  The patient may benefit from stenting.  I  will discuss the case with Dr. Lissa Hoard.  He will see the patient  Monday.  Sigmund I. Patsi Sears, M.D.  Electronically Signed     SIT/MEDQ  D:  01/18/2008  T:  01/19/2008  Job:  [id number]   cc:   [facility]   [employer], Dr.

## 2010-09-05 NOTE — H&P (Signed)
NAMEDESTRY, BEZDEK NO.:  [id number]   MEDICAL RECORD NO.:  [id number]          PATIENT TYPE:  MAT   LOCATION:  MATC                          FACILITY:  [facility]   PHYSICIAN:  Gerri Spore B. Earlene Plater, M.D.  DATE OF BIRTH:  05-12-1970   DATE OF ADMISSION:  09/20/2006  DATE OF DISCHARGE:                              HISTORY & PHYSICAL   </CHIEF COMLAINT>  Vaignal bleeding.   HISTORY OF PRESENT ILLNESS:  G3P3 presents with 2 day history of heavy  menstrual bleeding, soaking a pad a tampon every 30-60 minutes.  Previously menses were regular and normal flow. No aggrevating or  alleviating factors noted. Has had associated clots and dysmenorrhea.   PAST MEDICAL HISTORY:  1. Kidney stones with multiple stents and nephrostomy in the past      followed by [facility], most recently Dr. Sherron Monday.  2. History of anemia in pregnancy, lowest hemoglobin of 6, ultimate      etiology unclear by the patient's report.  3. History of back pain followed by [facility], Dr. Concepcion Elk,      apparently she has been prescribed Opana (long-acting oxymorphone)      but states that she is not taking it regularly at this time. The      patient reports the pain is apparently due to degenerative joint      disease of the spine and has had several nerve ablations for the      pain.  4. Tourette's syndrome.  5. History of drug and alcohol abuse.   SOCIAL HISTORY:  The patient smokes. Denies drug use or heavy alcohol  drinking. The patient denies physical abuse.   FAMILY HISTORY:  Noncontributory.   PAST SURGICAL HISTORY:  1. Tubal ligation.  2. Multiple urological procedures as outlined above.   MEDICATIONS:  Prozac, Xanax, Orap, Opana, Flexeril, albuterol, Allegra,  iron.   ALLERGIES:  BENADRYL, CIPRO, DEPAKOTE, IMITREX AND TORADOL.   REVIEW OF SYSTEMS:  Otherwise positive for constipation and leakage of  urine with coughing or straining.   PHYSICAL EXAMINATION:  VITAL  SIGNS:  Temperature 98, blood pressure  126/64, pulse 62, respirations 22.  In general, the patient is alert and oriented in no acute distress and  appears to be in mild to moderate crampy pain. The patient is noted to  have bruising of the right eye and a bruise on her right forearm. The  patient reports that she fell on a chair.  ABDOMEN:  Soft, nontender.  PELVIC:  Normal external genitalia. Vagina is noted to have a moderate  of clot and blood which is expelled with placement of speculum. After  that, only slight additional bleeding is seen via the cervix. The cervix  appears normal, and the patient reports normal Pap smear postpartum  about year ago. Uterus is not enlarged. No adnexal masses are palpable.   LABORATORY DATA:  Serum pregnancy test is negative, and CBC shows a  white blood cell count of 9.5, hemoglobin 12.6, platelets 398. Basic  metabolic profile is within normal limits. Hepatic function panel also  within normal  limits. PT, PTT, INR all within normal limits.   Ultrasound of the pelvis shows normal uterus and normal ovaries.   ASSESSMENT:  Heavy uterine bleeding without evidence for hemodynamic  compromise or anemia. Discussed with the patient that surgical  management will probably ultimately be necessary. She is status post  tubal ligation and states she does not desire additional child bearing.  Given this, hysterectomy would certainly be curative. However, she does  have additional complaints of urinary incontinence and a vague history  suggestive of rectocele with difficulty with bowel movements and  constipation, although she has also used opioids chronically for  management of her pain which may also be contributory. I did not get a  good look at the rectocele today, given the amount of bleeding that was  present. However, she is followed by urology of her kidneys. Dr.  Sherron Monday had apparently been talking to her about the possibility of a  pubovaginal  sling in the future. Therefore, perhaps these surgeries can  be combined.   At the moment, plan will be to get her bleeding to stop or at least slow  down significantly. Prescription given for Aygestin 5 mg tablets; taper  dosage as instructed with 3 tablets daily for the next 2 to 3 days, and  then tapering down as she is able, assuming the bleeding normalizes. In  addition, she is given a prescription for Tylox 1 to 2 tablets every 4  to 6 hours as needed for pain as she does complain of crampy lower  abdominal pain associated with the bleeding. The patient denies current  use of opioids, states she does not have a contract with her pain  management physician and that it is acceptable to receive prescription  pain medicine from other providers. The patient is instructed to follow  up in the office next week with further arrangements to be made  thereafter.      Gerri Spore B. Earlene Plater, M.D.  Electronically Signed     WBD/MEDQ  D:  09/20/2006  T:  09/20/2006  Job:  [id number]

## 2010-09-05 NOTE — Op Note (Signed)
NAMEJUNG, INGERSON NO.:  [id number]   MEDICAL RECORD NO.:  [id number]          PATIENT TYPE:  OIB   LOCATION:  9371                          FACILITY:  [facility]   PHYSICIAN:  [place] B. Earlene Plater, M.D.  DATE OF BIRTH:  August 11, 1970   DATE OF PROCEDURE:  12/19/2006  DATE OF DISCHARGE:                               OPERATIVE REPORT   PREOPERATIVE DIAGNOSIS:  Abnormal uterine bleeding, stress urinary  incontinence, and symptomatic rectocele.   POSTOPERATIVE DIAGNOSIS:  Abnormal uterine bleeding, stress urinary  incontinence, and symptomatic rectocele.   PROCEDURE:  Total laparoscopic hysterectomy, followed by a SPARC sling,  cystoscopy, and posterior repair with graft by Dr. Sherron Monday.   SURGEON:  Chester Holstein. Earlene Plater, M.D.   ASSISTANT:  Martina Sinner, M.D.   ANESTHESIA:  General.   OPERATIVE FINDINGS:  Normal uterus, tubes, and ovaries. Cystocele and  rectocele.   SPECIMEN:  Uterus and cervix.   BLOOD LOSS:  150 mL.   COMPLICATIONS:  None.   INDICATIONS:  Patient with the above history for definitive surgical  management.  Advised the risks of surgery including infection, bleeding,  damage to surrounding organs.   DESCRIPTION OF PROCEDURE:  The patient is taken to the operating room  and general anesthesia obtained.  She is prepped and draped in a  standard fashion and a Foley catheter inserted in the bladder.  The  uterus was sounded to 8 cm and the #8 Rumi tip assembled and inserted  and secured.  A 10 mm incision was placed in the umbilicus and carried  to the fascia.  The fascia was divided sharply, elevated, and the  posterior sheath and peritoneum entered sharply.  A purse-string suture  of 0 Vicryl was placed on the fascial defect.  A Hassan cannula was  inserted and secured.  Pneumoperitoneum was obtained with CO2 gas.  An  11 mm XL trocar was placed in the left lower quadrant, 5 mm in the  right, each under direct laparoscopic visualization.   Trendelenburg position obtained.  Bowel mobilized superiorly. The course  of each ureter identified and found to be well away.  The left round  ligament was sealed and divided with the gyrus as was the tube and utero-  ovarian pedicle.  The bladder flap was created sharply and advanced  inferiorly with sharp and blunt technique.  The left uterine artery was  skeletonized, sealed and divided with the gyrus.  The entire procedure  was repeated on the right side in the exact same manner.   The uterus was deviated posteriorly, the KOH cup elevated, and anterior  colpotomy made with the plasma spatula, carried around to each angle.  The uterus was then deviated anteriorly and posterior colpotomy made in  a similar manner.  The vaginal angles were then taken down with the  gyrus bipolar.  The uterus was then delivered into the vagina and  vaginal cuff closed with interrupted plain stitches of 0 Vicryl.  Indigo  carmine was given IV and Dr. Sherron Monday took over the case.   We proceeded vaginally for his sling, cystoscopy  and posterior pair.  Please see his note for details.   After this portion of the procedure was completed, I returned to the  abdomen, reinspected the vaginal cuff which was found to be hemostatic.  Therefore, the inferior ports were removed.  Their sites were  hemostatic.  The scope was removed, gas released, Hassan cannula  removed.  The umbilical incision was elevated with [employer] retractors.  The pursestring sutures were snugged down.  This obliterated the fascial  defect.  No intra-abdominal contents herniated through prior to closure.  The subcutaneous tissue at the umbilicus in the left lower quadrant was  reapproximated with 3-0 Vicryl.  The skin was closed at each site with  Dermabond.   The patient tolerated the procedure well without complications.  She was  taken to the recovery room awake, alert, in stable condition.      Gerri Spore B. Earlene Plater, M.D.   Electronically Signed     WBD/MEDQ  D:  12/19/2006  T:  12/20/2006  Job:  [id number]   cc:   Martina Sinner, MD  Fax: [phone]

## 2010-09-05 NOTE — H&P (Signed)
[name], [name] [name]   MEDICAL RECORD NO.:  [id number]          PATIENT TYPE:  INP   LOCATION:  1313                         [facility]ACILITY[facility]  [facility]   PHYSICIAN:  Vania Rea, M.[name]. DATE OF BIRTH:  May 16, 1970   DATE OF ADMISSION:  01/15/2008  DATE OF DISCHARGE:                              HISTORY & PHYSICAL   PRIMARY CARE PHYSICIAN:  [name] [name].  The patient's psychiatrist is Dr.  Duayne Cal.   CHIEF COMPLAINT:  Right flank pain for a few hours.   HISTORY OF PRESENT ILLNESS:  This is a 40 year old Caucasian lady with a  past history of pyelonephritis secondary to an acute with obstructing  kidney stone who apparently required nephrostomy tube and since then had  chronic and recurrent kidney problems.  For the past 3 days, the patient  has had episodic colicky right flank pain which suddenly became worse  when it woke her from sleep at about 9:00 p.m. last evening, associated  with vomiting, and the patient reports that she could only lie down  because it was so painful, and eventually her fiance brought to the  emergency room.  In the emergency room, the patient has received  morphine and Dilaudid without any resolution for pain.  She has been  noted to be afebrile and to have no elevated white count.  CT scan was  suggestive of some perinephric inflammation, and does show chronic  hydronephrotic changes.  In any event, the patient's pain is not  subsiding with treatment in the emergency room, and the hospitalist  service was called to assist with management.   PAST MEDICAL HISTORY:  1. Polysubstance abuse.  2. Pain-seeking behavior in the past admission.  3. History of bilateral pneumonia.  4. History of GERD.  5. Asthma.  6. Chronic back pain due to degenerative disk disease.  7. Depression.  8. Kidney stones.  9. Migraines.  10.History of Tourette's.   MEDICATIONS:  1. Prozac 20 mg twice daily.  2. Wellbutrin 1 tablet daily.  3. Xanax 1  mg every 3 hours.  4. Nexium 40 mg daily.  5. Orap 0.5 of 1 mg daily.  6. ProAir p.r.n. which she does not use often.  7. Advair 150/50 twice daily.   ALLERGIES:  TO ANTIHISTAMINES, CIPRO, DEPAKOTE, IMITREX AND TORADOL.   SOCIAL HISTORY:  She smokes a half a pack per day.  Denies alcohol or  illicit drug use.  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   FAMILY HISTORY:  Is significant for sister with hypertension and several  family members take Prozac.   REVIEW OF SYSTEMS:  Other than noted above, a 10-point review of systems  was unremarkable.   PHYSICAL EXAMINATION:  This is a young Caucasian woman mildly obese,  lying on the stretcher, drowsy from pain medications, but quite anxious  about getting pain medications and saying the pain she is having is  quite severe.  VITALS:  Temperature is 98.2, pulse 93, respiration 19, blood pressure  126/97.  She saturated 100% on room air.  Her pupils are  1-2 mm, round and equal.  Mucous membranes pink and  anicteric.  She has no cervical lymphadenopathy.  No thyromegaly.  No jugular venous  distention.  CHEST:  Clear to auscultation bilaterally.  CARDIOVASCULAR SYSTEM:  Regular rhythm without murmur.  ABDOMEN:  Obese and soft, and she has tenderness in the right flank and  right lower quadrant.  EXTREMITIES:  Without edema.  She has 2+ pulses bilaterally.  CENTRAL NERVOUS SYSTEM:  Cranial nerves II-XII are grossly intact.  She  has no focal neurologic deficits.   LABORATORY DATA:  Her white count is 10.9, hemoglobin 11, platelets 287.  She has 80% neutrophils.  Absolute neutrophil count is actually slightly  elevated at 8.8.  Her serum chemistry:  Her sodium is 136, potassium 4,  chloride 103, glucose 151, BUN 13, creatinine 1.0.  Pregnancy test was  negative.  Urinalysis showed cloudy urine, specific gravity 1.014, small  amount of blood, positive for nitrites, large amounts of leukocyte   esterase.  Microscopy reveals 11-20 red cells, white cells too numerous  to count and many bacteria.  CT scan of abdomen and pelvis without  contrast revealed chronic right hydronephrosis with thickening of the  wall of the renal pelvis and chronic visualization of 1-2 mm diameter  proximal right ureteral calculus raising questions of chronic  obstruction with infection or less likely xanthogranulomatous  pyelonephritis.  CT scan of the pelvis revealed no distal ureteral  calcification or dilation.   ASSESSMENT:  1. Acute and chronic pyelonephritis, acute on chronic ureteral      obstruction.  2. Chronic pain syndrome.  3. History of Tourette's syndrome.  4. History of depression.  5. History of gastroesophageal reflux disease.   PLAN:  Will admit this lady to the hospitalist service for IV fluid  hydration.  She does give a history of allergy to Cipro, but she did  tolerate Avelox without any difficulty on a previous admission.  Will  treat her with Rocephin for the time being and await urine cultures.  We  will go ahead and consult urologist for possible stent placements since  the hydronephrosis does persist.      Vania Rea, M.[name].  Electronically Signed     LC/MEDQ  [name]:  01/16/2008  T:  01/16/2008  Job:  [id number]

## 2010-09-05 NOTE — Op Note (Signed)
[name], [name] NO.:  [id number]   MEDICAL RECORD NO.:  [id number]          PATIENT TYPE:  OIB   LOCATION:  9371                          FACILITY:  [facility]   PHYSICIAN:  Martina Sinner, MD DATE OF BIRTH:  [birth year]   DATE OF PROCEDURE:  12/19/2006  DATE OF DISCHARGE:                               OPERATIVE REPORT   PREOPERATIVE DIAGNOSIS:  Stress urinary incontinence plus rectocele.   POSTOPERATIVE DIAGNOSIS:  Stress urinary incontinence plus rectocele.   PROCEDURE PERFORMED:  Sling, cystourethropexy plus rectocele repair plus  graft plus cystoscopy, and laparoscopic hysterectomy.   SURGEON:  Scott A. MacDiamid, M.D.   ASSISTANT:  Marina Gravel, M.D.   DESCRIPTION OF PROCEDURE:  The patient underwent a laparoscopic  hysterectomy and this will be dictated by Dr. Earlene Plater.  The cuff was  closed.   The patient legs were put in the moderate lithotomy position.  There is  no question on inspection of the vagina.  She did not need a cystocele  repair.   I made two 1 cm incisions, 1.5 cm lateral to the midline with a  fingerbreadth above the symphysis pubis.  I made a 2 cm incision  overlying the mid urethra.  I instilled lidocaine epinephrine mixture  approximately 8 cc to minimize bleeding and so I could develop a thick  pubocervical flap.  I dissected sharply to the urethrovesical angle  bilaterally.  With the bladder empty I passed the [facility] needles on top  of and along the back of the symphysis pubis parallel to the midline  under the pulp of my left index finger.  With the bladder empty I  attached the sling and brought it up through the retropubic space.  I  tensioned it over the fat part of a moderate-sized [employer].  I cut below  the blue dots.  I irrigated the sheath.  I removed the sheath.  I was  very happy with the position of the sling.  The sling was in the mid  urethra and would  hypermobile in the midline as well as laterally.  There was not a  lot of spring back effect.   Before attaching the sling I cystoscoped the patient.  There was no  injury to the bladder or urethra.  There was efflux of Indigo Carmine  from both ureteral orifices with good ureteral jets.  On deflection of  the trocar there was no indentation of the bladder.   I closed the vaginal wall incision with 2-0 Vicryl suture with two  interrupted sutures.  I closed the skin with 4-0 Vicryl.   I was very pleased with the sling and therefore we went ahead and did  the rectocele repair.   She had a very long posterior vaginal wall.  I placed two Allis's on the  introitus at the level of the hymenal ring.  I removed a small triangle  of perineal skin.  I instilled approximately 20 ml of lidocaine  epinephrine to try to minimize bleeding and help develop a plane between  Allis clamps.  I made a long midline  incision to approximately 1.5 cm  from the vaginal cuff.  I sharply dissected the posterior vaginal wall  from the rectovaginal fascia.   I used a double ring retractor and I was very pleased with the  mobilization of the vaginal wall to the pelvic sidewall.   I did a rectal examination and she had a diffuse defect with very thin  rectovaginal fascia.  I was surprised how thin the rectovaginal fascia  was based upon her age.  She did not have an enterocele.   I did a two-layer gentle posterior repair leaving some at the apex.  I  took thin bites not to distort the anatomy since it was not a lot of  fascia and I also did not want to injure the bowel wall.  There was a  nice reduction of the rectocele.   Using a narrow malleable I gently broke through the rectal pillar  bilaterally and I could easily identified the ileal coccygeus muscle.  Using 2-0 Vicryl on a UR5 needle I placed a suture to the ileal  coccygeus muscle bilaterally.  I also placed the same suture through the  rectovaginal fascia near the introitus.  I sewed in a 4 x 7 cm dermal  graft.  The  dermal graft was sewn to the perineal body and I did a  perineal repair with zero Vicryl.  The graft fit in very nicely.  I  trimmed approximately four or five mm of vaginal mucosa bilaterally.  I  closed the vaginal wall with running 2-0 Vicryl on a CT one.  The suture  was locking.  I brought the suture into the perineum and closed the  perineum with subcuticular suture.   The vaginal length was excellent at the end of the case.  There was no  ring or narrowing.  Rectal examination was normal.  Total blood loss  first for the sling and rectocele repair was approximately 50-100 ml.   A vaginal pack with estrogen cream was inserted.   Dr. Earlene Plater double-checked the vaginal cuff laparoscopically and closed  the laparoscopic portion.           ______________________________  Martina Sinner, MD  Electronically Signed     SAM/MEDQ  D:  12/19/2006  T:  12/20/2006  Job:  [id number]   cc:   Gerri Spore B. Earlene Plater, M.D.  Fax: [phone]   Martina Sinner, MD  Fax: [phone]

## 2010-09-05 NOTE — Discharge Summary (Signed)
[name], [name] NO.:  [id number]   MEDICAL RECORD NO.:  [id number]          PATIENT TYPE:  INP   LOCATION:  1437                         FACILITY:  [facility]   PHYSICIAN:  Heloise Purpura, MD      DATE OF BIRTH:  1970/09/13   DATE OF ADMISSION:  12/09/2008  DATE OF DISCHARGE:  12/11/2008                               DISCHARGE SUMMARY   ADMISSION DIAGNOSES:  1. Abdominal/suprapubic pain.  2. Hypotension.   DISCHARGE DIAGNOSES:  1. Abdominal/suprapubic pain secondary to constipation.  2. Hypotension.   HISTORY AND PHYSICAL:  For full details, please see admission history  and physical.  Briefly, [name] is a 40 year old female who recently  underwent a right robotic ureteroureterostomy for a short ureteral  stricture.  She has had difficulties with chronic pain in the past and a  history of polysubstance abuse.  She also has had a very difficult time  with ureteral stents in the past.  She presented to the emergency  department on the evening of December 09, 2008, with complaints of severe  abdominal and suprapubic pain.  In addition, on her evaluation, she was  noted to be somewhat hypotensive.  She was administered IV fluid  hydration in the emergency department, and a CT scan was performed which  demonstrated her right ureteral stent to be in place and without  evidence for abscess or urinoma.  However, based on her pain symptoms as  well as the fact that she was noted to be mildly hypotensive, it was  felt that she should be admitted to the hospital for further evaluation  and treatment.   HOSPITAL COURSE:  On December 10, 2008, the patient was admitted to the  hospital by Dr. Gaynelle Arabian who initially assessed her.  She was placed  on a PCA for pain management initially and did use an excessive amount  of narcotic medication after she was given her PCA.  Her other vital  signs remained stable but with the use of her PCA, she became  significantly more  hypotensive.  At this time, she was administered  Narcan with subsequent resolution of her hypotension but was maintained  on IV fluid hydration throughout the morning.  Her vital signs  subsequently stabilized, and she was transitioned to a regular diet as  well as oral pain medication.  On her CT scan, she was noted to have  significant constipation.  She was, therefore, administered an enema as  well as some oral laxative agents.  She subsequently had two bowel  movements later that afternoon, and her abdominal pain significantly  improved.  Her white blood count on admission was slightly high at  15,000.  A urine culture, however, returned negative, and her antibiotic  therapy which she had begun empirically with ceftriaxone was  discontinued.  In addition, there were some concerns regarding urinary  retention in the emergency department earlier in the week.  However, on  review of the records, she only had 200 mL in her bladder at that time,  and, therefore, was felt unlikely to have true urinary  retention.  Her  catheter was removed during her hospital stay on December 10, 2008, and  she was able to void well.  Her postvoid residuals in the hospital were  between 1 and 200 mL.  She was monitored overnight and remained  hemodynamically stable.  Her white blood count when it was rechecked was  approximately 10,000, and it was not felt that she had an infectious  component that initially caused her hypotension.  It was felt that she  had been dehydrated and using an excessive amount of pain medication.  Therefore, it was felt that she was stable for discharge home on December 11, 2008.   DISPOSITION:  Home.   DISCHARGE MEDICATIONS:  She was instructed to resume her regular home  medications.  She was provided a prescription for oxycodone 5 mg 1-2  tablets p.o. q.4 h. p.r.n.  Due to her history of polysubstance abuse,  her mother has agreed to administer her pain medication so she does  not  exceed the maximum dose.  Both the patient and her mother have agreed to  this approach previously.  She was also told to try to limit her  narcotic use as much as possible to prevent further constipation and was  told to take a stool softener as well as any over-the-counter laxatives  as necessary.   DISCHARGE INSTRUCTIONS:  She was instructed to resume regular diet and  activity as tolerated.   FOLLOWUP:  She will follow up as scheduled in the next few weeks for  stent removal which hopefully should significantly alleviate some of her  pain and discomfort which likely is related to her ureteral stent and  bladder spasms.      Heloise Purpura, MD  Electronically Signed     LB/MEDQ  D:  12/11/2008  T:  12/11/2008  Job:  [id number]

## 2010-09-05 NOTE — Op Note (Signed)
[name], [name] NO.:  [id number]   MEDICAL RECORD NO.:  [id number]          PATIENT TYPE:  AMB   LOCATION:  DAY                          FACILITY:  [facility]   PHYSICIAN:  Heloise Purpura, MD      DATE OF BIRTH:  [birth year]   DATE OF PROCEDURE:  02/25/2008  DATE OF DISCHARGE:                               OPERATIVE REPORT   PREOPERATIVE DIAGNOSES:  1. Right hydronephrosis.  2. Right ureteral stricture.  3. History of nephrolithiasis.   POSTOPERATIVE DIAGNOSIS:  1. Right hydronephrosis.  2. Right ureteral stricture.  3. History of nephrolithiasis.   PROCEDURE:  1. Cystoscopy.  2. Right retrograde pyelography.  3. Right ureteroscopy.  4. Balloon dilation of right ureteral stricture.  5. Holmium laser incision of right ureteral stricture.  6. Right ureteral stent placement (8 x 24).   SURGEON:  Dr. Heloise Purpura.   ASSISTANT:  Dr. Trevor Mace.   ANESTHESIA:  LMA.   INDICATIONS:  [name] is a 40 year old female with a history of a  proximal ureteral stricture likely related to a history of an impacted  ureteral calculus.  She recently presented with worsening right-sided  flank pain and decreasing relative renal function, despite adequate  washout on Lasix renal scan.  She underwent ureteral stent placement  with subsequent improvement of her pain and based on the fact that her  symptoms seemed to improve and the fact that she had decreasing relative  renal function, it was decided to proceed with treatment for her right  ureteral stricture.  We discussed options including ureteral  reconstruction with excision of her strictured area versus endoscopic  treatment with either balloon dilation or laser incision.  She  subsequent elected to proceed with the above procedures and the  potential risks, complications, and alternative options associated with  these procedures were discussed in detail.  Informed consent was  obtained.   DESCRIPTION OF  PROCEDURE:  The patient was taken to the operating room  and general anesthesia was induced.  She was given preoperative  antibiotics, placed in the dorsal lithotomy position, prepped and draped  in the usual sterile fashion.  Next, a preoperative time-out was  performed.  Cystourethroscopy was then performed and the patient's  indwelling right ureteral stent was identified and pulled out to the  urethral meatus.  A 0.038 sensor guidewire was advanced through the  stent up into the right renal pelvis under fluoroscopic guidance.  A 6-  [place] ureteral catheter was then advanced over the wire and Omnipaque  contrast was injected.  This did demonstrate a proximal ureteral  stricture which appeared to be short although it was difficult to fully  fill out the proximal portion of the ureter above the stricture.  There  is no evidence of any filling defects and mild to moderate  hydronephrosis which has been chronic.  It was decided to proceed with  direct visualization of the patient's ureteral stricture with  ureteroscopy.  The semi-rigid ureteroscope was therefore advanced next  to the indwelling safety wire and there was noted to be a short 1  cm  proximal ureteral stricture at the level of the L3 vertebra.  It was  decided to proceed with an attempt at balloon dilation.  Therefore the 4  cm ureteral balloon dilator was advanced over the wire and positioned at  the correct position under fluoroscopic guidance.  The balloon was then  inflated.  However, even with adequate pressure within the balloon,  there appeared to still be an area of waist within the balloon.  Therefore the balloon was deflated and removed and it was decided to  proceed with holmium laser incision under direct ureteroscopic  visualization.  The semi-rigid ureteroscope was then again advanced up  to the level of the stricture and a 365 fiber was advanced through the  ureteroscope and to an incision of the ureteral  stricture was performed  on the lateral aspect of the ureter.  This was incised until  periureteral fat was identified.  Once the stricture was appropriately  opened, contrast was injected through the ureteroscope and the  extravasation was identified.  The ureteroscope was then removed and the  safety wire was back loaded over the cystoscope.  An 8 x 24 double-J  ureteral stent was then advanced over the wire using Seldinger  technique.  It was appropriately positioned under fluoroscopic and  cystoscopic guidance and the wire was removed with a good curl noted in  the renal pelvis as well as in the bladder.  The patient's bladder was  then emptied and the procedure was ended.  She tolerated the procedure  well and without complications.   She will be discharged home today with plans to follow up in 6 weeks for  ureteral stent removal.  She was given a prescription for Vicodin 40  tablets with no refills today.      Heloise Purpura, MD  Electronically Signed     LB/MEDQ  D:  02/25/2008  T:  02/26/2008  Job:  [id number]

## 2010-09-05 NOTE — Discharge Summary (Signed)
[name], [name] NO.:  [id number]   MEDICAL RECORD NO.:  [id number]          PATIENT TYPE:  INP   LOCATION:  1313                         FACILITY:  [facility]   PHYSICIAN:  Isidor Holts, M.D.  DATE OF BIRTH:  10-23-1970   DATE OF ADMISSION:  01/15/2008  DATE OF DISCHARGE:  01/23/2008                               DISCHARGE SUMMARY   DISCHARGE DIAGNOSES:  1. Escherichia coli acute pyelonephritis.  2. Right chronic hydronephrosis/proximal ureteral stricture, status      post stent January 21, 2008.  3. Gastroesophageal reflux disease.  4. Bronchial asthma.  5. Smoking history.  6. History of migraines.  7. History of Tourette syndrome.  8. Depression.  9. History of chronic back pain/degenerative joint disease.   DISCHARGE MEDICATIONS:  1. Prozac 20 mg p.o. b.i.d.  2. Wellbutrin XL 150 mg p.o. daily.  3. Xanax 1 mg p.o. p.r.n. q.3 h.  4. Nexium 40 mg p.o. daily.  5. Orap 0.5 mg p.o. daily.  6. Advair Discus (250/50) 1 puff b.i.d.  7. ProAir inhaler 2 puffs p.r.n.  8. Vicodin (5/500) 1 p.o. p.r.n. q.4-6 h. A total of 42 pills have      been dispensed.  9. Keflex 500 mg p.o. b.i.d. for 5 days only.  10.Pyridium 200 mg p.o. t.i.d. for 3 days only.   PROCEDURES:  1. Abdominal/pelvic CT scan dated January 16, 2008:  This showed      chronic right hydronephrosis with thickening of wall of renal      pelvis and chronic visualization of a 1- to 2-mm diameter proximal      right ureteral calculus raising question of chronic obstruction      with infection.  Pelvic CT scan showed no distal ureteral      calcification or dilatation.  There was a small amount of      nonspecific free pelvic fluid, small area of inflammatory change      noted in upper central pelvis of uncertain etiology.  Appendix not      discretely visualized.  Cecum noted to be mobile and across the      midline in the pelvis.  2. Nuclear medicine renal imaging scan dated January 20, 2008:  This      showed normal left renal curve, small right kidney with poor      cortical uptake, and no spontaneous clearance prior to Lasix.      However, there is a response of Lasix evident, but there is      incomplete clearance of right kidney at the completion of the exam.      The constellation of findings is compatible with a partially      obstructed kidney.  Differential renal function is 77.7 on the left      and 22.3% on the right.  3. Abdominal/chest x-ray dated January 21, 2008:  This showed      nonobstructive bowel gas pattern with gaseous distention of the      transverse colon in the central abdomen, incompletely reconstituted      right double-J  ureteral stent.   CONSULTATION:  1. Dr. Jethro Bolus, urologist.  2. Dr. Heloise Purpura, urologist.   ADMISSION HISTORY:  As in H & P notes of January 15, 2008, dictated by  Dr. Vania Rea.  However, in brief, this is a 40 year old female,  with known history of GERD, bronchial asthma, DJD/chronic back pain,  depression, urolithiasis, migraines, history of Tourette's syndrome,  previous history of polysubstance abuse, presenting with colicky right  flank pain of a few hours duration.  Abdominal imaging studies  demonstrated chronic right hydronephrosis, urolithiasis and obstructive  features.  Urinalysis showed positive urinary sediment consistent with  urinary tract infection.  She was admitted for further evaluation,  investigation and management.   CLINICAL COURSE:  1. Acute Escherichia coli pyelonephritis.  The patient's urinalysis as      mentioned above, showed a positive urinary sediment, consistent      with urinary tract infection, with white cells too numerous to      count, many bacteria, RBC of 11-20.  The patient was started on      empiric Rocephin intravenously.  Subsequent urine cultures grew      pansensitive E-coli.  As of January 22, 2008, she had completed a 5-      day course of  intravenous Rocephin.  She was transitioned to oral      Keflex from January 23, 2008, for a further 5 days to complete a      total of 10-day course of antibiotic treatment.  During the course      of her hospitalization, the patient remained afebrile, and white      cell count remained within normal limits.   1. Obstructive uropathy.  For details of abdominal imaging findings      and nuclear medicine scan, referred to procedure list above.      However, the patient showed features of obstructive uropathy,      consisting of right hydronephrosis and partial obstruction of the      right proximal ureter.  Urology consultation was kindly provided      initially by Dr. Jethro Bolus.  For details of consultation,      refer to consultations of that date.  She subsequently underwent      ureteroscopy, retrograde pyelography, and stent placement on      January 21, 2008, by Dr. Heloise Purpura in a successful procedure.   1. Gastroesophageal reflux disease. The patient remained asymptomatic      on proton-pump inhibitor.   1. Smoking history.  The patient was counseled appropriately.   1. History of depression and Tourette's syndrome.  There were no      problems referable to this, during the course of the patient's      hospitalization.   DISPOSITION:  The patient was on January 23, 2008, asymptomatic, keen to  be discharged, and was therefore discharged accordingly.  She did have  some issues with lower abdominal pain/bladder spasms on January 22, 2008.  These were dressed with scheduled analgesics and Pyridium.   DIET:  No restrictions.   ACTIVITY:  As tolerated.   FOLLOW-UP INSTRUCTIONS:  The patient has been instructed to follow up  with her primary urologist, Dr. Heloise Purpura in 3-4 weeks, telephone  number [phone].  She is to follow up routinely with her primary  psychiatrist, Dr. Duayne Cal.      Isidor Holts, M.D.  Electronically Signed     CO/MEDQ  D:   01/23/2008  T:  01/23/2008  Job:  [id number]   cc:   Heloise Purpura, MD  Fax: [phone]   Duayne Cal, M.D.

## 2010-09-05 NOTE — Op Note (Signed)
NAMEPAISLIE, TESSLER NO.:  [id number]   MEDICAL RECORD NO.:  [id number]          PATIENT TYPE:  INP   LOCATION:  0005                         FACILITY:  [facility]   PHYSICIAN:  Heloise Purpura, MD      DATE OF BIRTH:  [birth year]   DATE OF PROCEDURE:  12/01/2008  DATE OF DISCHARGE:                               OPERATIVE REPORT   PREOPERATIVE DIAGNOSES:  1. Right ureteral stricture.  2. Right flank pain.   POSTOPERATIVE DIAGNOSES:  1. Right ureteral stricture.  2. Right flank pain.   PROCEDURES:  1. Cystoscopy.  2. Right retrograde pyelography.  3. Right ureteral stent placement.  4. Right robotic-assisted laparoscopic ureteroureterostomy.   SURGEON:  Dr. Heloise Purpura   ASSISTANT:  Delia Chimes, Nurse Practitioner.   ANESTHESIA:  General.   COMPLICATIONS:  None.   ESTIMATED BLOOD LOSS:  50 mL.   SPECIMEN:  Right ureteral stricture.   DISPOSITION OF SPECIMEN:  To pathology.   DRAINS:  1. Number 15 Blake perinephric drain.  2. A 16-French Foley catheter.  3. An 8 x 26 double-J right ureteral stent.   INDICATIONS:  Ms. Cromie is a 40 year old female with a history of  longstanding right hydronephrosis and a short right ureteral stricture  which has been thought to be related to a previous impacted proximal  ureteral calculus.  She has undergone an Endoureterotomy and  subsequently developed a recurrent stricture.  She also has chronic  right-sided flank pain and it has been unclear how much of this is  related to her kidney and ureteral obstruction.  After a discussion  regarding management options for treatment and after her failed  endoscopic procedure, we had a thorough discussion regarding management  options for treatment.  She did elect to proceed with a  ureteroureterostomy.  It was felt this that this could be performed in a  minimally invasive fashion and she did agree to proceed with the above  procedures.  The potential risks,  complications, and alternative options  were discussed in detail and informed consent was obtained.   DESCRIPTION OF PROCEDURE:  The patient was taken to the operating room  and a general anesthetic was administered.  She was given preoperative  antibiotics, placed in the dorsal lithotomy position, and prepped and  draped in the usual sterile fashion.  Next, a preoperative time-out was  performed.  Cystourethroscopy was then performed which revealed no  abnormalities within the bladder.  The right ureteral orifice was  identified and intubated with a 6-French ureteral catheter.  Omnipaque  contrast was then injected and this confirmed a short 1-cm stricture at  approximately the level of L3.  The 6-French ureteral catheter was then  advanced up to this level over a 0.038 sensor guidewire.  A 16-French  Foley catheter was then inserted into the bladder and the stent was  secured to the catheter with a 0 silk tie.  Fluoroscopy confirmed the  ureteral stent to be in the correct position.  The patient was then  carefully repositioned in the right modified flank position with care  not  to move the ureteral stent.  Her abdomen was prepped and draped in  the usual sterile fashion and a site was selected just to the right of  the umbilicus for placement of the camera port.  This was placed using a  standard open Hassan technique which allowed entry into the peritoneal  cavity under direct vision without difficulty.  A 12-mm port was then  placed and a pneumoperitoneum established.  The 0-degree lens was then  used to inspect the abdomen and there was no evidence for any intra-  abdominal injuries or other abnormalities.  The remaining ports were  then placed with 8-mm robotic ports placed in the right upper quadrant,  right lower quadrant, and far right lower quadrant.  A 12-mm port was  placed in the right upper midline.  All ports were placed under direct  vision without difficulty.  The  surgical cart was then docked.   The colon was then mobilized medially and the ureter was identified and  separated from the gonadal vein.  The ureter was then traced up to the  point where the ureteral stent ended and the ureter was divided at this  point.  Inferiorly, there was noted to be normal ureter with bleeding  edges consistent with normal tissue.  Above this level, there was dense  scar and an approximately 2-cm segment was transected and removed for  pathologic analysis.  Proximally, this left good healthy ureter with  bleeding edges.  The inferior portion of the ureter was then spatulated  laterally and the superior portion was spatulated medially.  Interrupted  4-0 Vicryl sutures were then placed to reapproximate the ends of the  ureter.  Once the posterior anastomosis had been completed with  interrupted sutures an 8 x 26 double-J ureteral stent was placed.  This  was placed over a guidewire which was inserted through the assistance  port site.  Once the stent was felt to be in appropriate position the  remainder of the anastomosis was completed with interrupted 4-0 Vicryl  sutures.   A #15 Blake drain was then brought through the far right lateral lower  quadrant robotic port site and appropriately positioned in the  perinephric space near, but not necessarily on, the anastomosis.  This  was secured to the skin with a nylon suture.  At this point the surgical  cart was undocked.  The C-arm was then brought in and this confirmed the  proximal curl of the ureteral stent to be in the correct position.  However, the bladder could not be examined appropriately due to the  positioning on the table.   Therefore, the patient's abdomen was closed.  Vicryl-0 sutures were  placed laparoscopically to close both 12-mm port sites.  The 8-mm port  sites had been removed under direct vision.  Marcaine 0.25% was then  injected into all port sites which were reapproximated at the skin  level  with 4-0 subcuticular Monocryl sutures and Dermabond.  The patient was  then repositioned and the indwelling Foley catheter that had been  originally placed was removed.  Of note, the patient's open-ended  ureteral stent had been removed during the procedure prior to placement  of her final double-J ureteral stent.  Flexible cystoscopy was then  performed which confirmed the patient's ureteral stent to be in place.  This was slightly repositioned pushing the stent more proximally to  allow it to lie appropriately within the  bladder.  A new 16-French Foley catheter was then  sterilely placed and  the procedure was ended.  She tolerated the procedure well without  complications.  She was able to be extubated and transferred to the  recovery unit in satisfactory condition.      Heloise Purpura, MD  Electronically Signed     LB/MEDQ  D:  12/01/2008  T:  12/01/2008  Job:  [id number]

## 2010-09-05 NOTE — H&P (Signed)
NAMEMarland Kitchen  [name], [name] NO.:  [id number]   MEDICAL RECORD NO.:  [id number]          PATIENT TYPE:  INP   LOCATION:  1410                         FACILITY:  [facility]   PHYSICIAN:  Hillery Aldo, M.D.   DATE OF BIRTH:  December 05, 1970   DATE OF ADMISSION:  05/31/2007  DATE OF DISCHARGE:                              HISTORY & PHYSICAL   PRIMARY CARE PHYSICIAN:  [facility] .   CHIEF COMPLAINT:  Dyspnea.   HISTORY OF PRESENT ILLNESS:  The patient is a 40 year old female with a  past medical history of asthma who has developed increasing shortness of  breath and wheeze since this morning.  She admits to chills but denies  known fever.  She has a cough but it is mainly nonproductive.  She  denies chest pain but reports what she is describing as rib pain.  She  has no past medical history of hospitalization for pneumonia or asthma  exacerbation.  Upon initial evaluation in the emergency department, her  chest x-ray showed new perihilar and bibasilar edema or infiltrates and  therefore she is being admitted.   PAST MEDICAL HISTORY:  1. Asthma.  2. Allergic sinusitis.  .3  Obstructive sleep apnea.  1. Gastroesophageal reflux disease.  2. Chronic back pain with chronic pain syndrome.  3. Degenerative disk disease.  4. Anxiety.  5. Depression.  6. 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 Migraine headaches.  8. Tourette's syndrome.  9. History of polysubstance abuse including alcohol, cocaine and      narcotics.  10.Abnormal uterine bleeding and rectocele status post hysterectomy.   FAMILY HISTORY:  The patient reports that her parents and her siblings  are all healthy. History is limited as the patient keeps falling asleep  during the interview secondary to having received 2 mg of Dilaudid by  the ED physician.   SOCIAL HISTORY:  The patient is separated but has a new fiance.  She  currently lives with her parents part of the time and with her fiance  part of time.  She is unemployed.  She smokes less than a pack of  tobacco daily and has done so for 23 years.  She admits to cocaine use,  last use was 1 week ago which she snorted.  She also admits to  intermittent alcohol use.   ALLERGIES:  TORADOL, DEPAKOTE, CIPRO, IMITREX, BENADRYL.   CURRENT MEDICATIONS:  1. Prozac 20 mg b.i.d.  2. Wellbutrin unspecified doses daily.  3. Xanax 1 mg q.i.d.  4. Nexium 20 mg daily.   REVIEW OF SYSTEMS:  As noted in the elements of the HPI above.  Otherwise positive for chronic back pain.   PHYSICAL EXAM:  Temperature 97.3, pulse 94, respirations 20, blood  pressure 106/67, O2 saturation 97% on 2 liters.  GENERAL:  A lethargic female who is in no acute distress.  She is  sedated from having received Dilaudid.  HEENT:  Normocephalic, atraumatic.  PERRL.  EOMI.  Oropharynx clear.  NECK:  Supple, no thyromegaly, no lymphadenopathy, no jugular venous  distention.  CHEST:  Lungs clear to auscultation bilaterally with good air movement.  I do not hear any wheezes, rhonchi, or rales.  HEART:  Regular rate and rhythm.  No murmurs, rubs, or gallops.  ABDOMEN:  Soft, nontender, nondistended with normoactive bowel sounds.  EXTREMITIES:  No clubbing, edema, cyanosis.  SKIN:  Warm and dry.  No rashes.  NEUROLOGIC:  The patient is extremely lethargic but otherwise nonfocal.   DATA REVIEW:  Chest x-ray shows new perihilar and bibasilar edema or  infiltrates.   LABORATORY DATA:  None was obtained by the ED physician.   ASSESSMENT/PLAN:  1. Dyspnea of uncertain origin:  Although the patient's chest x-ray      does show perihilar and bibasilar edema or infiltrates, the patient      is not wheezing and does not have any prolongation of her      expiratory phase or other signs of bronchospasm. She actually has      good  air movement.  Additionally, there are no laboratory values      for me to examine to determine if she has leukocytosis. At this      point, she needs further evaluation including arterial blood gases,      a D-dimer to rule out acute pulmonary embolism (if this is elevated      with proceed with a CT angiogram),  a BNP value to determine if the      abnormalities seen on chest radiography are due to pulmonary edema,      CBC with differential to determine if she has any signs of      infection, and a comprehensive metabolic panel. Given the patient's      history of cocaine abuse, this could be a cardiac problem.      Accordingly, I will get a 12-lead EKG and cycle cardiac enzymes q.8      h x3 sets.  2. Gastroesophageal reflux disease:  Will continue the patient on      proton pump inhibitor therapy.  3. Chronic pain syndrome:  Would avoid IV narcotics in this patient      given her history of polysubstance abuse.  Would use Percocet      p.r.n. for pain control.  4. Anxiety/depression/Tourette's syndrome:  Continue the patient's      Xanax, Prozac, and Wellbutrin once the dosage is known.  5. History of nephrolithiasis and chronic right ureteropelvic junction      obstruction:  Will check a UA and culture.  6. Prophylaxis:  Will initiate Lovenox for DVT prophylaxis and      Protonix for GI prophylaxis.      Hillery Aldo, M.D.  Electronically Signed     CR/MEDQ  D:  05/31/2007  T:  06/02/2007  Job:  [id number]

## 2010-09-05 NOTE — H&P (Signed)
NAMECHERYEL, KYTE NO.:  [id number]   MEDICAL RECORD NO.:  [id number]          PATIENT TYPE:  MAT   LOCATION:  MATC                          FACILITY:  [facility]   PHYSICIAN:  Gerri Spore B. Earlene Plater, M.D.  DATE OF BIRTH:  Oct 22, 1970   DATE OF ADMISSION:  09/20/2006  DATE OF DISCHARGE:                              HISTORY & PHYSICAL   CHIEF COMPLAINT:  Vaginal bleeding.   HISTORY OF PRESENT ILLNESS:  A 40 year old white female, gravida 3, para  3, initially presented earlier today to [facility] with  complaints of vaginal bleeding, subsequently transferred to [facility] for further evaluation after being deemed hemodynamically  stable. The patient presents with a two-day history of increasing  vaginal bleeding and associated clots since the evening prior to visit  today. At times, the blood is gushing. She at times is soaking through  a pad and tampon about every 30 minutes and even staining onto her  clothes or the bed. Last normal menstrual period was about a week prior  to this episode. She is status post tubal ligation about one year ago  post partum. Prior to this episode of bleeding, her periods have been  monthly and heavy by report but not to this extent.   Patient's past medical history is complicated by several factors,  primarily history of recurrent kidney stones and need for stenting and  nephrostomy tube at various times in the last two years. She has had a  history of anemia while pregnant to the range a hemoglobin of 6.   Dictation ends here.      Gerri Spore B. Earlene Plater, M.D.  Electronically Signed     WBD/MEDQ  D:  09/20/2006  T:  09/21/2006  Job:  [id number]

## 2010-09-05 NOTE — Op Note (Signed)
NAMEMADDISEN, VOUGHT NO.:  [id number]   MEDICAL RECORD NO.:  [id number]          PATIENT TYPE:  INP   LOCATION:  1313                         FACILITY:  [facility]   PHYSICIAN:  Heloise Purpura, MD      DATE OF BIRTH:  Oct 04, 1970   DATE OF PROCEDURE:  01/16/2008  DATE OF DISCHARGE:                               OPERATIVE REPORT   PREOPERATIVE DIAGNOSIS:  Right hydronephrosis.   POSTOPERATIVE DIAGNOSES:  1. Right hydronephrosis.  2. Ureteral stricture.   PROCEDURE:  1. Cystoscopy.  2. Right retrograde pyelography.  3. Right ureteroscopy.  4. Right ureteral stent placement (6 x 24).   SURGEON:  Dr. Heloise Purpura.   ANESTHESIA:  General.   COMPLICATIONS:  None.   ESTIMATED BLOOD LOSS:  Minimal.   INDICATION:  Ms. Holdman is a 40 year old female with a history of  nephrolithiasis and a right UPJ obstruction.  She had previously  undergone a right percutaneous nephrolithotomy for a stone impacted at  the right UPJ.  She has had persistent and chronic right-sided  hydronephrosis with known decreased relative renal function of the right  kidney which has been stable at approximately 30-35%.  She has been  followed conservatively with nuclear medicine renography.  She has been  noncompliant and has failed to follow-up for scheduled appointments.  At  her last evaluation in December 2008, her relative renal function on the  right side appeared to be stable with no evidence of obstruction of the  right kidney after Lasix administration.  She presented to the hospital  last week with worsening right-sided pain extending from her lower chest  down to her buttock and over across over abdomen.  She is known to have  chronic pain and was unsure whether this might be related to her kidney.  She did undergo a CT scan which demonstrated worsen right-sided  hydronephrosis.  A nuclear medicine renal scan was repeated and again  demonstrated no obvious obstruction with a good  washout curve in  response to Lasix.  Lasix T1/2 time was 9 minutes.  However, she did  have decreased relative renal function of her right kidney.  Based on  this finding, I recommended proceeding with further evaluation at this  point and the above procedures.  Potential risks, complications, and  alternative options were discussed in detail, and informed consent was  obtained.   DESCRIPTION OF PROCEDURE:  The patient was taken to the operating room,  and a general anesthetic was administered.  She was given preoperative  antibiotics, placed in the dorsal lithotomy position, and prepped and  draped in the usual sterile fashion.  Next, preoperative time-out was  performed.  Cystourethroscopy was performed which demonstrated no  evidence of any bladder tumors, stones, or other mucosal pathology  within the bladder on systematic examination.  The right ureteral  orifice was then identified and intubated with a 6-French ureteral  catheter, and contrast was injected.  This demonstrated a right  hydronephrosis with some dilation of the proximal ureter.  The distal  and mid aspect of the ureter appeared to be  of normal caliber without  filling defects.  At the level of L3, there was noted to be a short  narrowing measuring approximately 1 cm.  A 0.038 sensor guidewire was  then advanced up into the right renal pelvis without difficulty.  Semi-  rigid ureteroscopy was then performed, and the ureter appeared normal up  to the level of L3 where the aforementioned narrowing was identified.  This area was not able to be traversed with the ureteroscope and the  wire already in place.  At this point, it was decided to place a  ureteral stent.  The ureteroscope was removed, and the cystoscope was  replaced.  A 6 x 24 double-J ureteral stent was then advanced over the  wire using Seldinger technique and appropriately positioned in the renal  pelvis as well as in the bladder.  The wire was then removed  with a good  curl noted in the renal pelvis and the bladder.  The patient's bladder  was emptied, and the procedure was ended.  She appeared to tolerate  procedure well and without complications.  She was able to be extubated  and transferred to recovery unit in satisfactory condition.   DISPOSITION:  She will plan to follow-up in approximately 3-4 weeks for  discussion regarding management options which would include balloon  dilation versus laser endopyelotomy versus ureteral reconstruction which  likely could be performed via laparoscopic or robotic approach.      Heloise Purpura, MD  Electronically Signed     LB/MEDQ  D:  01/21/2008  T:  01/22/2008  Job:  [id number]

## 2010-09-05 NOTE — Assessment & Plan Note (Signed)
[facility]                         GASTROENTEROLOGY OFFICE NOTE   SHEBA, WHALING                       MRN:          [id number]  DATE:09/27/2006                            DOB:          1971-02-04    Mrs. Muckle is a 40 year old white female with chronic pain syndrome  who is referred by Dr. Laverle Patter for evaluation of numerous different  gastrointestinal complaints mostly revolving around alternating diarrhea  and constipation, abdominal pain, and rectal bleeding.   Mrs. Carnegie has a very involved past history revolving around numerous  urologic problems with chronic right UPJ obstruction requiring multiple  stents. She obviously had recurrent urinary tract infections and has  even had nephrostomy tubes in the past. She has had psychiatric problems  with drug and alcohol addiction and has a chronic pain syndrome and is  on chronic narcotic therapy.   She is referred by Dr. Laverle Patter for evaluation of rectal bleeding and  rectal pain with alternating diarrhea and constipation. She says that  she had a colonoscopy some two years at [facility] and cannot  remember the physician's name. She had a recent CT scan on 06/29/06 which  showed no intraabdominal or intrapelvic abnormalities, but a dilated  right ureter. She does have what sounds like occasional rectal prolapse  and alleged in the past has been told that she had ulcerations in her  rectum. She is followed by Dr. Sandria Manly from OB-GYN and has heavy uterine  bleeding and has a planned hysterectomy this next week planned.   Recent laboratory data per her multiple physicians have shown a normal  CBC, coag status, and metabolic profile.   As mentioned above, the patient's main complaint is one of alternating  diarrhea and constipation, gas, bloating, and occasional rectal  bleeding. She uses daily stool softeners and p.r.n. MiraLax. She  complains of acid reflux symptoms of her other severe  degree and only  takes Nexium once a day. She has no associated dysphagia. In addition,  the patient has had no anorexia, weight loss, or history of known gall  bladder disease, or liver problems.   PAST MEDICAL HISTORY:  Otherwise, has included chronic anxiety and  depression, allergic sinusitis, chronic recurrent headaches, sleep  apnea, and Tourette's syndrome.   MEDICATIONS:  Multiple. The patient did not bring all of her drug  dosages today. She takes:  1. Nexium 40 mg daily.  2. Prozac 20 mg daily.  3. Xanax several times a day.  4. ORAP.  5. Opana.  6. Flexeril.  7. Albuterol.  8. Allegra.  9. Aygestin.   She allegedly has had reactions in the past to TORADOL, BENADRYL,  IMITREX, CIPRO, DEPAKOTE, etcetera. These are all various reactions and  are not attributed to urticaria or anaphylactic reactions.   FAMILY HISTORY:  Remarkable for diabetes, but no known gastrointestinal  problems.   SOCIAL HISTORY:  She is separated and lives with her parents. She has  three children. Her last child was born approximately a year ago. She  smokes less than a 1/2 pack of cigarettes per day and denies ethanol  abuse currently, but used to have a history of ethanol abuse.   REVIEW OF SYSTEMS:  The patient describes chronic insomnia, chronic low  back pain, severe fatigue, urinary incontinency, shortness of breath  with exertion, and occasional confusion. She denies specific  cardiovascular, pulmonary, or endocrine problems. Her last menstrual  period was May 17. She apparently follows a regular diet and denies  specific food intolerance.   She is a healthy appearing white female with a black and blue right eye  from a recent fall. She is awake and alert and is in no acute distress.  She is 5 feet tall and weighs 141 pounds. Blood pressure 122/66, pulse  76 and regular. I could not appreciate stigmata or chronic liver  disease. Her chest was clear. There was no thyromegaly or   lymphadenopathy. She appeared to be in a regular rhythm without murmurs,  gallops, or rubs. I could not appreciate hepatosplenomegaly, abdominal  masses, or significant tenderness. Her upper extremities were  unremarkable. There was a large bruise over her right forearm. The  inspection of the rectum was unremarkable, as well the rectal exam.  There was a rectocele present. She also had what appeared to be a rather  firm and retroflex uterus that is pushing on her anterior rectal wall.  Stool was formed and guag negative.   ASSESSMENT:  1. Rather marked acid reflux requiring probable twice-a-day PPI      therapy.  2. Diarrhea predominant irritable bowel syndrome with what sounds like      intermittent rectal prolapse and rectal bleeding.  3. Rectal cystocele that is to be repaired at the time of her      hysterectomy.  4. Chronic UPJ obstruction followed by Dr. Laverle Patter.  5. History of chronic cigarette abuse.  6. History of Tourette's syndrome.  7. Prior history of drug and alcohol abuse.  8. 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   RECOMMENDATIONS:  1. I have increased the patient's Nexium to 40 mg twice daily along      with standard anti-reflux maneuvers.  2. I will check outpatient colonoscopy exam once her upcoming surgery      has been completed.  3. I am reluctant to give this patient any further medications at this      time until her clinical picture and outcome seems more obvious.   ADDENDUM   The patient also has a history of chronic low back pain and goes to  [facility], and sees a Dr. Concepcion Elk, who is prescribing long  acting oxymorphone. None of these records are available for review.     Vania Rea. Jarold Motto, MD, Caleen Essex, FAGA  Electronically Signed    DRP/MedQ  DD: 09/27/2006  DT: 09/28/2006  Job #: [id number]   cc:   Crecencio Mc, M.D.  Gerri Spore B. Earlene Plater, M.D.  Genene Churn. Love, M.D.

## 2010-09-05 NOTE — Op Note (Signed)
[name], Nichole NO.:  [id number]   MEDICAL RECORD NO.:  [id number]          PATIENT TYPE:  INP   LOCATION:  1410                         FACILITY:  [facility]   PHYSICIAN:  Coralyn Helling, MD        DATE OF BIRTH:  Jul 26, 1970   DATE OF PROCEDURE:  06/06/2007  DATE OF DISCHARGE:  06/08/2007                               OPERATIVE REPORT   PREOPERATIVE DIAGNOSIS:  Rule out hypersensitive pneumonitis, rule out  Pneumocystis carinii pneumonia.   POSTOPERATIVE DIAGNOSIS:  Rule out extensive pneumonitis, rule out  Pneumocystis carinii pneumonia.   OPERATION/PROCEDURE:  Bronchoscopy with transbronchial biopsy and  bronchoalveolar lavage from the left upper lobe and right upper lobe.   INDICATIONS:  [name] is a 40 year old female who was admitted for  shortness of breath and found to have bilateral more predominantly upper  lobe ground-glass opacities on chest x-ray and CT scan.  She is  scheduled for bronchoscopy for further evaluation of this.  The  procedure was explained to the patient.  Risks were detailed including  possibilities of bleeding, pneumothorax, infection, and diagnosis.  The  patient signed a consent form.   DESCRIPTION OF PROCEDURE:  She is brought to the bronchoscopy suite.  She was given a total of 12 mg of Versed and 150 mcg of fentanyl for  sedation and analgesia.  She had lidocaine nebulized to her nares and  posterior pharynx for topical anesthesia.  The bronchoscope was  initially tried to be entered through the left nares but the passage was  narrow.  Therefore, it was entered orally.  The vocal cords were  visualized and appeared normal and had normal motion.  Topical lidocaine  was instilled as needed.  The bronchoscope was then entered into the  trachea.  The carina was visualized and did not appear to be widened.  The left main bronchus was then entered.  The left upper lingular and  lower lobe orifices were visualized and there is  no obvious  endobronchial lesions and mucosa appeared normal.  The right main  bronchus was then entered and the right upper, middle and lower lobe  orifices were all visualized.  Again the mucosa appeared normal and  there were no obvious lesions.  Bronchioalveolar lavage was then done  from the right upper lobe with 60 mL saline instilled with approximately  25 mL of cloudy white fluid returned.  Bronchioalveolar lavage was then  done from the left upper lobe with 60 mL of fluid instilled with  approximately 20 mL of cloudy white fluid returned.  Then using  fluoroscopic guidance, transbronchial biopsy was obtained from the left  upper lobe.  There was a lot bleeding which resolved spontaneously and  then again using fluoroscopic guidance, transbronchial biopsies were  obtained from the right upper lobe.  Again there was a minimal amount of  bleeding which resolved spontaneously.  The patient tolerated procedure  well otherwise and there are no other obvious complications.  The  bronchoscope was then withdrawn.  The patient remained hemodynamically  stable throughout procedure.  The specimens will  be sent for Gram stain,  culture, AFB, fungal culture, PCP cytology and surgical pathology.  A  chest x-ray will be done in the recovery room      Coralyn Helling, MD  Electronically Signed     VS/MEDQ  D:  06/06/2007  T:  06/09/2007  Job:  [id number]

## 2010-09-05 NOTE — Op Note (Signed)
[name], BEAIRD NO.:  [id number]   MEDICAL RECORD NO.:  [id number]          PATIENT TYPE:  AMB   LOCATION:  DAY                          FACILITY:  [facility]   PHYSICIAN:  Heloise Purpura, MD      DATE OF BIRTH:  May 08, 1970   DATE OF PROCEDURE:  10/14/2008  DATE OF DISCHARGE:                               OPERATIVE REPORT   PREOPERATIVE DIAGNOSIS:  1. Right flank pain.  2. Right hydronephrosis.  3. History of right ureteral stricture, status post endoureterotomy.   POSTOPERATIVE DIAGNOSIS:  1. Right flank pain.  2. Right hydronephrosis.  3. Recurrent right ureteral stricture.   PROCEDURES:  1. Cystoscopy.  2. Right retrograde pyelography.  3. Right ureteroscopy with brush biopsy.   SURGEON:  Dr. Heloise Purpura.   ANESTHESIA:  LMA.   COMPLICATIONS:  None.   ESTIMATED BLOOD LOSS:  None.   INTRAOPERATIVE FINDINGS:  Retrograde pyelography demonstrated a small 1  cm proximal ureteral stricture.  On endoscopy, the patient was not noted  to have any intraureteral tumors or other abnormalities indicating a  likely benign ureteral stricture consistent with her history of an  impacted stone and prior ureteral stricture.   INDICATION:  [name] is a 40 year old female with a history of  nephrolithiasis and an impacted right ureteral calculus.  She did have a  subsequent right ureteral stricture which was treated with an  endoureterotomy with a holmium laser.  This was followed and she  developed recurrent right flank pain and is here now for reevaluation.  The potential risks, complications, and alternative treatment options  associated with the above procedures were discussed in detail with the  patient and informed consent was obtained.   DESCRIPTION OF PROCEDURE:  The patient was taken to the operating room  and a general anesthetic was administered.  She was given preoperative  antibiotics, placed in the dorsal lithotomy position, and prepped and  draped in the usual sterile fashion.  Next, a preoperative time out was  performed.  Cystourethroscopy was then performed which demonstrated the  ureteral orifices to be in the normal anatomic position.  Systematic  survey of the bladder revealed no evidence for any bladder tumors,  stones, or other mucosal pathology.  Attention then turned to the right  ureteral orifice which was cannulated with a 6-French ureteral catheter.  Omnipaque contrast was injected which demonstrated a normal caliber  ureter without filling defects up to the level of the proximal ureter.  Just below the level of the transverse process of the L3 vertebra, the  patient was noted to have a small 1 cm narrowing consistent with a  probable stricture.  The renal pelvis and proximal ureter were able to  be filled out and demonstrated no evidence of any abnormalities.  A  0.038 Sensor guidewire was then advanced up into the renal pelvis past  the ureteral stricture without difficulty.  A long semi rigid  ureteroscope was then advanced next to the wire up to the proximal  ureter where the ureteral stricture was identified.  This could not be  bypassed  with the ureteroscope.  A brush biopsy of the right ureteral  stricture was then performed and the ureteroscope was withdrawn and  removed.  The patient's bladder was emptied and the procedure was ended.  She tolerated this procedure well and without complications.  She was  able to be awakened and transferred to the recovery unit in satisfactory  condition.      Heloise Purpura, MD  Electronically Signed     LB/MEDQ  D:  10/14/2008  T:  10/14/2008  Job:  [id number]

## 2010-09-05 NOTE — Discharge Summary (Signed)
NAMEAVRYL, ROEHM NO.:  [id number]   MEDICAL RECORD NO.:  [id number]          PATIENT TYPE:  INP   LOCATION:  1410                         FACILITY:  [facility]   PHYSICIAN:  Elliot Cousin, M.D.    DATE OF BIRTH:  [birth year]   DATE OF ADMISSION:  05/31/2007  DATE OF DISCHARGE:  06/08/2007                               DISCHARGE SUMMARY   DISCHARGE DIAGNOSES:  1. Bilateral pulmonary infiltrates, treated as pneumonia. Status post      bronchoscopy with no apparent endobronchial lesions. Cultures      pending. 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  2. Hostile and drug-seeking behavior during the hospital course.  3. Chronic pain syndrome, mainly low back pain and migraine headaches.  4. Anxiety with depression. Histrionic behavior during the hospital      course.  5. History of polysubstance abuse.  6. Status post fall with no radiographic findings suggestive of      fracture.   DISCHARGE MEDICATIONS:  1. Prozac 40 mg daily.  2. Wellbutrin 150 mg daily.  3. Xanax 1 mg q.i.d.  4. Albuterol inhaler 2 puffs every 4 hours as needed.  5. Advair Diskus 250/50 one puff b.i.d.  6. Percocet 5 mg every 4 hours as needed (#45 pills written, no      refills).  7. Soma 350 mg every 6 hours p.r.n. (#30 pills written with no      refills).  8. Maxalt 10 mg as needed for migraines, repeat in 2 hours as needed      (#5 pills prescribed with 1 refill).   CONSULTATIONS:  Coralyn Helling, M.D.   PROCEDURE PERFORMED:  1. Bronchoscopy performed by Dr. Craige Cotta on June 06, 2007.  2. Two-D echocardiogram performed on June 05, 2007. The results      revealed overall left ventricular systolic function was normal.      Ejection fraction estimated to be 60%. No diagnostic evidence of      left ventricular regional wall motion abnormalities.  3. Chest x-ray on June 06, 2007. The results revealed  improvement      in aeration from prior exam. Persistent left basilar residual      atelectasis or infiltrates. No diagnostic pneumothorax.  4. CT scan of the head without contrast. The results revealed no acute      intracranial abnormality.  5. X-ray of the lumbar spine on June 05, 2007. The results      revealed no acute osseous abnormality in the lumbar spine.  6. CT scan of the chest on June 04, 2007. The results revealed      bilateral primarily ground glass pulmonary opacities with small      bilateral pleural effusions and a small pericardial effusion.      Findings may reflect atypical infection including pneumocystis,      drug reaction, extrinsic allergic alveolitis, and atypical edema.      No adenopathy.   HISTORY OF PRESENT ILLNESS:  The patient is a 40 year old woman with a  past medical history significant for polysubstance abuse, migraine  headaches, chronic low back pain, and asthma. She presented to the  emergency department on May 31, 2007 with a chief complaint of  shortness of breath. When she was evaluated in the emergency department,  she was noted to be afebrile and hemodynamically stable. Her chest x-ray  revealed new perihilar and bibasilar edema or infiltrate. The patient  was therefore admitted for further evaluation and management.   For additional details, please see the dictated history and physical.   HOSPITAL COURSE:  #1.  BILATERAL PULMONARY INFILTRATES. The patient was  started on antibiotic treatment with Avelox 400 mg IV daily. Oxygen  supplementation was provided as well. The patient was started on  nebulizer treatments with albuterol and Atrovent. She has a known  history of tobacco use, and she was counseled on tobacco cessation. Of  note, the patient did acknowledge snorting cocaine approximately one  week prior to her admission. Substance abuse srevices were offered.  A D-  dimer was ordered for evaluation of pulmonary embolism.  The D-dimer was  within normal limits at 0.29. An urine drug screen was ordered and was  positive for cocaine. An ABG was ordered at the time of the initial  hospital assessment on 2 liters of oxygen, and the results revealed a pH  of 7.3, pCO2 of 46, and a pO2 of 103. Cardiac enzymes were ordered as  well and were within normal limits. Her TSH was assessed and was within  normal limits at 0.953. Prednisone was stared empirically. However, the  prednisone was discontinued after the patient's headache worsened.   A followup chest x-ray was ordered on June 03, 2007. The results  revealed shifting pulmonary infiltrates. Given this unusual  interpretation, a CT scan of the chest with contrast was ordered. The CT  scan of the chest revealed bilateral ground-glass pulmonary opacities  with small bilateral pleural effusions and a small pericardial effusion.  Given these findings, a 2-D echocardiogram and BNP were ordered. The  patient's BNP was within normal limits at 35.4. The echocardiogram  revealed that the patient had preserved LV function with an ejection  fraction estimated to be 60%. Subsequently, pulmonologist, Dr. Craige Cotta, was  consulted. He ordered a sed rate and an HIV antibody screen. The sed  rate was mildly elevated at 43, and the HIV antibody was nonreactive.  Subsequently, he performed a bronchoscopy. The bronchoscopy revealed no  endobronchial lesions. Specimens were taken and sent for culturing. As  of today, the respiratory culture results are pending. However, on the  gram stain, there were moderate WBCs present, rare gram positive cocci  in pairs and clusters. The AFB smear x2 was negative.   The patient has improved symptomatically. She completed a total of 9  days of Avelox therapy. Her oxygen saturations are now ranging between  98 and 97% on room air. She is currently stable for hospital discharge.  The patient was again advised to avoid tobacco and cocaine use.    #2.  CHRONIC PAIN SYNDROME WITH HOSTILE, HISTRIONIC, AND DRUG-SEEKING  BEHAVIOR DURING THE HOSPITAL COURSE. The patient has a history of  polysubstance abuse and demonstrated drug-seeking behavior during the  hospital course. She acknowledged that she had not been treated with any  opioid pain medications over the past month. Apparently, she was not  adherent or compliant with the recommendations of the previous pain  clinic physicians. I did speak with the patient's parents who  acknowledged that the  patient has been very difficult to manage with  regards to her drug abuse and chronic pain. They also acknowledged that  the patient was not compliant with recommendations that were given by  the pain clinic physicians. The patient was started on Soma and Percocet  as needed for pain. She demanded more pain medication during the  hospital course, mainly intravenous Dilaudid. Intravenous Dilaudid was  avoided given the patient's history. However, when she complained of  worsening migraine headaches, the prednisone was discontinued, and she  was given Dilaudid 2 mg x1 dose. The patient agreed that she would not  ask for additional Dilaudid as it was explained to her that intravenous  Dilaudid was not an appropriate treatment for migraine headaches. She  was eventually started on as-needed Maxalt. The Maxalt relieved her  headache during the hospital course. Per the patient's request, Seroquel  was added at bedtime for additional restful sleep. Ambien had already  been ordered p.r.n. for insomnia. Multiple times during the hospital  course the patient became hostile and verbally abuse, basically because  of her inability to control how much opioid medications she could  receive. The patient finally settled down when she realized that she  could not manipulate the nursing staff or the medical team.   A referral has been made for the patient to apply for assistance at  another pain clinic in the  local area.   #3.  ANXIETY WITH DEPRESSION AND HISTRIONIC BEHAVIOR DURING THE HOSPITAL  COURSE. The patient was maintained on Xanax, Prozac and Wellbutrin. Her  psychiatrist is Dr. Duayne Cal. She was advised to follow up with Dr. Duayne Cal  in one week.      Elliot Cousin, M.D.  Electronically Signed     DF/MEDQ  D:  06/08/2007  T:  06/08/2007  Job:  [id number]

## 2010-09-08 NOTE — Consult Note (Signed)
NAMEMYSTIC, LABO NO.:  [id number]   MEDICAL RECORD NO.:  [id number]            PATIENT TYPE:   LOCATION:                                 FACILITY:   PHYSICIAN:  Lindaann Slough, M.D.       DATE OF BIRTH:   DATE OF CONSULTATION:  04/05/2005  DATE OF DISCHARGE:                                   CONSULTATION   REASON FOR CONSULTATION:  History of kidney stone and right flank pain.   The patient is a 40 year old female who has a history of kidney stones. She  is now [redacted] weeks pregnant and had a stent placed in September 2006.  The  stent was replaced on March 11, 2005. For the past three days she has  been having severe right flank pain associated with nausea and vomiting. She  has had  problems with  stent in the past and that is why the stent was  replaced in November. She came to the emergency room last night and was  found to have a white count of 22,000 with 92% neutrophils. The patient is  still complaining of right flank pain. Renal ultrasound shows a stone in the  lower pole of the right kidney and the stent is seen up to the level of the  right ureteropelvic junction. She has moderate right hydronephrosis.   PAST MEDICAL HISTORY:  1.  Tourette's syndrome.  2.  Polysubstance abuse.  3.  History of renal stone.   SOCIAL HISTORY:  She smokes less than one pack a day and has smoked for more  than 20 years. She used to drink heavily, but states that she has cut back  since she has become pregnant.   ALLERGIES:  She is allergic to TORADOL and ANTIHISTAMINES.   MEDICATIONS:  She is not taking any medications at home.   FAMILY HISTORY:  Father and mother are alive and well, in good health. She  has four sisters.   PHYSICAL EXAMINATION:  GENERAL: This is a well built 40 year old female who  is complaining of right flank pain.  VITAL SIGNS: Her temperature now is 98.2, and last night temperature was  101.3. blood pressure 106/59, pulse 73, respirations  18.  ABDOMEN: Distended. She is tender in the right and left flank. She has right  CVA tenderness. She has a Foley catheter that is draining well. She is [redacted]  weeks pregnant.   Hemoglobin today is 7.5 and was 6.5 last night, hematocrit 22.0, and WBC  18.7 today and was 22.5 last night. Urine culture is pending.   IMPRESSION:  1.  Right renal stone with moderate hydronephrosis in a 15-week pregnant      patient.  2.  Urinary tract infection.  3.  Anemia.   SUGGESTION:  Continue IV Rocephin. Dr. Sherron Monday, who is the patient's  urologist, will follow the patient in the morning. If the patient continues  to have pain may need to replace the double-J catheter.      Lindaann Slough, M.D.  Electronically Signed     MN/MEDQ  D:  04/05/2005  T:  04/08/2005  Job:  [id number]

## 2010-09-08 NOTE — Op Note (Signed)
NAMEMarland Kitchen  ZELA, SOBIESKI NO.:  [id number]   MEDICAL RECORD NO.:  [id number]          PATIENT TYPE:  WOC   LOCATION:  WOC                          FACILITY:  [facility]   PHYSICIAN:  Heloise Purpura, MD      DATE OF BIRTH:  1971/04/12   DATE OF PROCEDURE:  10/10/2005  DATE OF DISCHARGE:                                 OPERATIVE REPORT   PREOPERATIVE DIAGNOSIS:  Right UPJ calculus.   POSTOPERATIVE DIAGNOSIS:  Right UPJ calculus.   PROCEDURE:  1.  Right percutaneous nephrostolithotomy (1st stage).  2.  Intraoperative fluoroscopy.   SURGEON:  Heloise Purpura, M.D.   ANESTHESIA:  General.   COMPLICATIONS:  None.   ESTIMATED BLOOD LOSS:  Less than 50 mL.   INTRAOPERATIVE FINDINGS:  The patient's right UPJ calculus was found to be  impacted at the UPJ.   INDICATION:  Ms. Economou is a 40 year old female who recently developed  right-sided flank pain while pregnant.  She subsequently was found to have a  9 mm right renal calculus.  For relief of her pain, a ureteral stent was  placed.  However, the patient was unable to tolerate her stent and therefore  required a percutaneous nephrostomy tube for right renal drainage.  She  subsequently delivered a healthy child.  She now presents for definitive  management of her stone.  After discussion regarding management options, she  elected to proceed with a percutaneous approach due to her wish to have the  most definitive procedure.  Potential risks and benefits of the procedure  were discussed with the patient and she consented.   ASSISTANT:  The patient was taken to the operating room and a general  anesthetic was administered.  She was given preoperative antibiotics, placed  in the prone position, and prepped and draped in the usual sterile fashion.  A 0.038 Glidewire was then inserted through her indwelling nephrostomy tube  under fluoroscopic guidance and down into her bladder.  The nephrostomy tube  was then removed.  An  angiographic catheter was then passed over this wire  down into the bladder.  A stiff 0.038 guidewire was then passed through the  angiographic catheter into the bladder to be used as a safety wire.  A  ureteral access sheath was then passed over this wire into the renal pelvis.  The inner sheath was removed and a second 0.038 Glidewire was passed into  the renal pelvis and down into the bladder under fluoroscopic guidance.  Again a 0.38 angiographic catheter was then passed over this wire into the  bladder and the previous wire was replaced with a 0.038 sensor guidewire.  The NephroMax balloon dilator was then passed over the sensor guidewire and  appropriately positioned.  It was then inflated allowing dilation of the  renal parenchyma.  The 30-French access sheath was then passed over the  balloon and appropriately positioned.  The balloon was then deflated and  removed.  The rigid nephroscope was then used to examine the collecting  system of the kidney.  Due to the patient's narrow renal pelvis, the  nephroscope could not be used to visualize the ureteropelvic junction.  Therefore an attempt was made with the flexible nephroscope.  Again, the  flexible nephroscope was not able to be used to visualize the stone.  Therefore the flexible ureteroscope was passed into the renal pelvis.  It  was able to be passed down to the level of the ureteropelvic junction.  At  this point, the patient's calculus which had been visualized on fluoroscopy  was seen under direct vision and was seen to be impacted at the level of the  ureteropelvic junction.  The 200 micron holmium laser fiber was then used to  perform laser lithotripsy of the stone.  The entire stone was able to  eventually be lasered away from the mucosa of the ureteropelvic junction.  There was noted to be dense fibrotic tissue around the area of the impacted  stone.  Stone fragments were then removed with the nitinol basket.  Due to  the  fact the patient had an impacted stone, it was decided to place a  ureteral stent to minimize the risk of subsequent stricture formation.  Therefore a 6 x 24 double-J ureteral stent was passed over the sensor  guidewire and appropriately positioned in the bladder and renal pelvis under  fluoroscopic guidance.  The wire was removed with a good curl in renal  pelvis and the bladder.  A 20-French Council tip catheter was then passed  into the renal pelvis via the nephrostomy tube sheath.  This was positioned  in the pelvis and the sheath was cut away.  This was secured to the skin  with silk sutures.  The patient appeared to tolerate procedure well and  without complications.  She was able to be extubated and transferred to the  recovery unit in stable condition.  All sponge, needle counts were correct  x2 at the end of the procedure.           ______________________________  Heloise Purpura, MD  Electronically Signed     LB/MEDQ  D:  10/10/2005  T:  10/11/2005  Job:  [id number]

## 2010-09-08 NOTE — Discharge Summary (Signed)
[name], [name] NO.:  [id number]   MEDICAL RECORD NO.:  [id number]          PATIENT TYPE:  INP   LOCATION:  1416                         FACILITY:  [facility]   PHYSICIAN:  Heloise Purpura, MD      DATE OF BIRTH:  [birth year]   DATE OF ADMISSION:  10/08/2005  DATE OF DISCHARGE:  10/12/2005                                 DISCHARGE SUMMARY   ADMISSION DIAGNOSES:  1.  Right ureteropelvic junction calculus.  2.  Right flank pain.  3.  Fever.   POSTOPERATIVE DIAGNOSES:  1.  Right ureteropelvic junction calculus.  2.  Right flank pain.  3.  Fever.   PROCEDURE:  Right percutaneous nephrostolithotomy.   HISTORY:  [name] 40 year old female who was found to have flank pain  and a right renal calculus during her recent pregnancy.  She eventually was  managed with a right percutaneous nephrostomy tube.  She subsequently  delivered her child and then presented for definitive stone management.  She  was scheduled for a percutaneous nephrostolithotomy on October 10, 2005.  However, she presented to the emergency department on October 08, 2005  complaining of fever and worsening right-sided flank pain.   HOSPITAL COURSE:  After being evaluated in the emergency room, she was  admitted secondary to her low-grade fever and for pain control.  Due to a  recent office urine culture, she was placed on gentamicin along with broad-  spectrum coverage including Zosyn and Diflucan.Marland Kitchen  She was managed on her IV  antibiotics and remained afebrile throughout the next 24 hours.  It was  therefore decided that she could proceed with her stone procedure.  On October 10, 2005, she was taken to the operating room, and a percutaneous  nephrolithotomy was performed.  Her UPJ calculus was noted to be impacted  and required laser lithotripsy.  Due to the fact that she did have some  dense fibrotic tissue around the level of her impacted stone, it was decided  that she would need a ureteral stent  placed.  Of note, during the patient's  pregnancy, she had a percutaneous nephrostomy tube placed secondary to her  inability to tolerate a ureteral stent.  A nephrostomy tube was left  indwelling as well, and the patient was subsequently able to be transferred  to a regular hospital room following recovery from anesthesia.  She did have  some fever up to 101 overnight and was continued on her IV antibiotics.  Her  urine culture from her admission in the emergency room did not grow out any  definitive bacteria.  Therefore she was maintained on broad-spectrum  coverage, including gentamicin for her prior Klebsiella infection which was  only sensitive to aminoglycoside antibiotic.  Over the course of the next 24  hours, she remained afebrile.  She was subsequently transitioned to oral  pain medication and was able to be discharged home on October 12, 2005.   DISPOSITION:  Home.   DISCHARGE MEDICATIONS:  1.  The patient will resume her regular home medications.  2.  In addition, she was given a  prescription for more Vicodin, #40, for      pain control.  3.  She was also given Pyridium to help with her ureteral stent discomfort.   DISCHARGE INSTRUCTIONS:  She was instructed to resume a regular diet, and  was told she may resume activity as tolerated.  She was instructed to call  should she have continued pain or fever.   FOLLOWUP:  [name] will follow up in approximately 1-1/2 weeks to  reassess her situation.  If she is doing well at that time, her nephrostomy  tube will likely be removed.  However, if she is not tolerating her ureteral  stent, a nephroureteral stent may need to be placed, as the majority of the  patient's symptoms from her ureteral stent are bladder related.  However, I  have stressed to the patient that I think it is important to maintain a  stent across her ureteropelvic junction to try to prevent stricture  formation, considering her recent impacted stone.            ______________________________  Heloise Purpura, MD  Electronically Signed     LB/MEDQ  D:  10/12/2005  T:  10/12/2005  Job:  [id number]

## 2010-09-08 NOTE — H&P (Signed)
[name], PERRONE NO.:  [id number]   MEDICAL RECORD NO.:  [id number]          PATIENT TYPE:  EMS   LOCATION:  ED                           FACILITY:  [facility]   PHYSICIAN:  Hollice Espy, M.D.DATE OF BIRTH:  [birth year]   DATE OF PROCEDURE:  DATE OF DISCHARGE:                      STAT - MUST CHANGE TO CORRECT WORK TYPE   ATTENDING PHYSICIAN:  Theone Stanley, M.D.   OB/GYN:  Malva Limes, M.D.   UROLOGIST:  Martina Sinner, M.D.   CHIEF COMPLAINT:  Nausea, vomiting and kidney pain.   HISTORY OF PRESENT ILLNESS:  Nichole patient is a 40 year old white female with  past medical history of Tourette's syndrome, polysubstance abuse and renal  stones, status post renal stent placed in September of this year as well as  Nichole fact that she is now [redacted] weeks pregnant who presents to Nichole emergency  room complaining of nausea, vomiting and worsening kidney pain.  In addition  she reported that she has noted some passing of clots from Nichole rectum and  vaginal area, although this was consistent.  She has had continued problems  with back and kidney pain which been felt to be secondary to kidney stones.  She says that Nichole stent that had been put in she does not tolerate very  well.  She denies any drug use but admits to lightly smoking and drinking  even though she is pregnant.  On admission to Nichole emergency room, Nichole  patient was noted to have a significant UTI with a white count of 22.5.  Other labs of concern were a glucose of 157, potassium 2.9.  Yeast and too-  numerous-to-count white cells in her urine as well as hemoglobin of 6.5 and  hematocrit of 19.4 with an MCV of 109.  Nichole patient was started on IV  fluids, given a dose of IV Rocephin after blood cultures were drawn.  Had  multiple hemoccults which were done in Nichole emergency room and found to be  all heme-negative.  She was then typed and crossed and transfused, first of  two units of blood.  Currently she  said she is feeling a little bit better.  She said her breathing, which she did not realize, was a little winded is  now better.  She complained of recurrent back pain which, when Nichole Dilaudid  starts to wear off, she received several doses of Dilaudid here in Nichole  emergency room.  Otherwise she is doing well.  She is denies any headaches,  vision changes, dysphagia, chest pain, palpitations, shortness of breath,  wheezing, coughing.  She does complain of some back pain radiating to Nichole  front.  She has noted some mild hematuria, mostly significant pain while  urinating.  She denies any constipation or diarrhea.  She has been noticing  some blood in her stool for Nichole last few weeks.   PAST MEDICAL HISTORY:  1.  Polysubstance abuse including marijuana and opiates.  This is continued      while pregnant.  2.  Alcohol abuse.  Nichole patient apparently used to drink several mixed  drinks every night although she says since she has become pregnant, she      will occasionally drink a glass of wine but does nowhere near drink as      much as she used to.  3.  She admits to continued smoking.  She said she is down to about half a      pack a day.  4.  She also has a history of renal stones, status post stent placement in      her right kidney in September 2006, and she has a history of recurrent      UTIs on suppressive antibiotics.   MEDICATIONS:  Nichole only medication Nichole patient has been taking is Keflex at  home.  She has not been taking any prenatal vitamins.   ALLERGIES:  TORADOL and ANTIHISTAMINES.   SOCIAL HISTORY:  As noted for tobacco, alcohol and drug use.   FAMILY HISTORY:  Noncontributory.   PHYSICAL EXAMINATION:  VITAL SIGNS:  Temperature 101.3, heart rate 109, now  down to 87, blood pressure 129/76, respirations 24, oxygen saturation 92% on  2 L.  GENERAL:  Nichole patient is alert and oriented x3.  Is in minimal distress  secondary to back pain.  HEENT:  Normocephalic,  atraumatic.  Mucous membranes are moist.  NECK:  She has no carotid bruits.  HEART:  Regular rate and rhythm.  2/6 systolic ejection murmur consistent  with pregnancy.  LUNGS:  Clear to auscultation bilaterally.  ABDOMEN:  Soft, gravid, nontender.  Positive bowel sounds.  EXTREMITIES:  No clubbing, cyanosis or pitting edema.   LABORATORY DATA:  Urinalysis is noted to be small hemoglobin, moderate  bilirubin, 40 ketones, 100 protein, greater than 8 of urobilinogen and large  leukocyte esterase.  She is also nitrite positive.  She is noted to have a  urine positive pregnancy test.  Her quantitative hCG is 29,000.  She has too-  numerous-to-count white cells, 7-10 red cells and few bacteria as well as  yeast in her urine.  White count 22.5, hemoglobin 6.5, hematocrit 19.4, MCV  109, platelet count 353, 92% shift.  Sodium 130, potassium 2.9, chloride  100, bicarb 22, BUN 8, creatinine 2.8, glucose 167.  LFTs are noted for a  low total protein of 5.1, low albumin 2.8, alk phos 121.  Coags are normal.   ASSESSMENT/PLAN:  1.  Urinary tract infection, suspect urosepsis.  Will admit Nichole patient.      Start her on IV Rocephin which is a category B antibiotic for pregnancy.      Continue her urine cultures and insure that Nichole sensitivities are      stable.  Continue to follow her white count as well.  2.  Anemia.  Will continue to heme test Nichole patient's stool although it is      possible this may all be macrocytic anemia.  Nichole macrocytic component      may be secondary to alcohol abuse.  Will check a B12 and folate level      and start her on those medications as well as a prenatal vitamin daily.  3.  Fifteen weeks pregnancy.  Have spoken to Dr. Henderson Cloud covering for      [facility] and covering for Dr. Dareen Piano.  She advises to not      give any Lasix following administration of blood and they will continue     to follow Nichole patient but check fetal heart tones daily, but otherwise  they say that we are continuing to do everything we can.  4.  History of renal stones with status post a stent placed in Nichole right      ureter.  We will ask urology for assistance.  Nichole patient may need a      further cystoscopy and possible stent removal if there is indeed an      obstruction.  5.  History of polysubstance abuse as well as tobacco and alcohol.  Nichole      patient may benefit from some counseling.  6.  History of Tourette's.  Will also, given her previous history of      substance, check a urine drug screen.      Hollice Espy, M.D.  Electronically Signed     SKK/MEDQ  D:  04/04/2005  T:  04/05/2005  Job:  [id number]   cc:   Malva Limes, M.D.  Fax: [phone]   Martina Sinner, MD  Fax: [phone]

## 2010-09-08 NOTE — Discharge Summary (Signed)
NAMEABIE, KILLIAN NO.:  [id number]   MEDICAL RECORD NO.:  [id number]          PATIENT TYPE:  INP   LOCATION:  4702                         FACILITY:  [facility]   PHYSICIAN:  Hollice Espy, M.D.DATE OF BIRTH:  October 14, 1970   DATE OF ADMISSION:  04/05/2005  DATE OF DISCHARGE:  04/15/2005                                 DISCHARGE SUMMARY   PRIMARY CARE PHYSICIAN:  Malva Limes, M.D.   UROLOGIST:  Martina Sinner, M.D.   CONSULTANTS:  1.  Martina Sinner, M.D.  2.  Sherrine Maples T. Fredia Sorrow, M.D., interventional radiology.   DISCHARGE DIAGNOSES:  1.  Urinary tract infection with early signs of urosepsis.  2.  Renal stone over right kidney causing urinary retention, status post      replacement of a right nephrostomy tube by interventional radiology.  3.  Positive urinary cultures for yeast as well as Enterococcus.  4.  History of polysubstance abuse including alcohol and cocaine and      narcotics in the past.  5.  Tobacco abuse.   DISCHARGE MEDICATIONS:  The patient previously had been on Keflex and she  was not taking other medications at home including her prenatal vitamins.   1.  She was given medications of Colace 100 mg p.o. b.i.d.  2.  Laxative daily p.r.n.  3.  Thiamin 100 mg p.o. daily.  4.  Prenatal vitamin p.o. daily.  5.  Diflucan 100 mg p.o. daily x 12 more days.  6.  Ampicillin 250 mg p.o. q.6h. x 4 more days.  7.  Dilaudid 2 mg p.o. q.4h. p.r.n.  I have advised this patient that she      needs to use this medication quite sparingly secondary to her pregnancy.   FOLLOW-UP APPOINTMENT:  The patient is advised to follow up with Dr.  Sherron Monday as well as Dr. Dareen Piano, urology and OB, in the next one week.  She is also advised not to drink or smoke while pregnant.  Otherwise, she is  on a regular diet.   She is advised for activity to be increase activity slow.  The patient is  being discharged to home.   OVERALL DISPOSITION:   Improved.   HOSPITAL COURSE:  The patient is a 40 year old white female with past  medical history of polysubstance abuse and renal stone, status post a renal  stent placed in September, who presented at [redacted] weeks pregnant to the  emergency room complaining of nausea and vomiting and kidney pain.  She had  been complaining of passing some questionable clots.  She ws noted to have a  significant UTI with a white count of 22.5.  She also was slowly hypotensive  with a systolic blood pressure in the 90s and a heart rate above 100.  Her  urine was also noted to have too numerous to count white cells.  In  addition, the patient's hemoglobin was noted to be 6.5 with an MCV of 109.   HOSPITAL COURSE:  1.  In regard to the patient's urinary tract infections, given her previous  history of renal obstruction, it was suspected that she may have      urosepsis.  Blood cultures were drawn which were never positive for      infection.  It was felt that she had been caught for early signs of      sepsis secondary to UTI.  A moderate hydronephrosis was noted by      ultrasound on her right kidney.  A right renal stone was noted as well.      Urology was notified as was interventional radiology.  The patient's      urologist, Dr. Isabel Caprice, had placed a stent approximately three months      prior.  The patient, by urology, after discussion with OB, had a right      nephrostomy tube placed by ultrasound.  In addition, she had a previous      JJ catheter that was felt to be likely needed to be changed.  The      patient's urine was improved in draining following placement of the      stent.  Cultures were sent which were noted to be positive for yeast.      Following the patient's positive cultures for yeast, she was started on      Diflucan.  Initial plan by urology was for three days.  After three      days, this was discontinued.  The patient continued to have significant      pain and complaints of  dysuria.  Following the three day course of IV      Diflucan, a UA was rechecked and the patient was noted to have a      significant continued UTI with white blood cell clumps.  Her white count      was 12.9.  A repeat UA showed a partially treated UTI.  This time,      cultures were positive for Enterococcus but only at 20,000 colonies.      Given the patient's pregnancy and symptoms, it was best felt that she      needed to be treated for this.  She was initially started on IV Rocephin      which she did well with.  She received several days' course of      treatment.  A follow-up UA actually showed increase in her white count      on December 23 from 10.8 up to 12.9.  The patient remained afebrile but      did complain of symptomatic pain.  Given these findings, I started her      on IV Diflucan, feeling that perhaps the enterococcal UTI was also in      conjunction with her incompletely treated yeast in her urine.  She      tolerated this well.  By December 24, her white count had started to      come down to 12.  At this point, the patient was feeling much better in      terms of her pain.  I discussed this with her and she stated she would      like to go home.  At this point, she was medically stable.  Blood      pressure was fine.  She was afebrile.  Given the fact that she was      showing improved symptoms, I am discharging her home on 12 more days' of      Diflucan for a total course of  14 days of treatment as well as four more      days of ampicillin q.6h. to make up for the IV Rocephin for a total of      seven days of this treatment.  I have advised the patient to follow up      with Dr. Sherron Monday of urology in the next week at [facility] and there      will be possible removal of the stent.  2.  In regard the patient's 15 week pregnancy, initially I consulted with      Dr. Henderson Cloud who was covering for Dr. Dareen Piano and reviewed the case with     her.  She advised that given  the primary issues were more urological and      infectious that the patient did not need to come over to [facility] and they would continue to follow the patient if any      indications were needed.  She recommended the patient not be diuresed.      She recommended, in addition, that the patient have fetal heart tones      checked daily, which they were.  Heart tones always stayed within the      130s to 140s with no evidence of any episodes.  This remained stable.      The patient was resumed on her prenatal vitamins during her hospital      course.  3.  In regard to the patient's macrocytic anemia, this was felt possibly      secondary to the patient likely continuing to drink alcohol, which she      underplayed the amount that she does drink.  She says she drinks heavily      when not pregnant and drinks minor when she does.  I noted that she was      macrocytic.  I started her on thiamin and folate while here in the      hospital as well as watched her for withdrawal, which she did not have      any.  She received two units of blood and since then her hemoglobin has      been stable.  The last hemoglobin and hematocrit which was checked was      noted to be 10.9 and 31.8 on day of discharge.  4.  In regard to the patient's polysubstance abuse, the patient was advised      in terms of the need to stop taking drugs while pregnant, especially.      She did require significant amounts of pain medication during her      hospitalization secondary to severe pain in her back as well as the      dysuria.  We finally were able to get to a regimen she was able to      tolerate, which we had to try to minimize as much as possible with her      pregnancy.  We were able to get her down to Dilaudid IV 2 mg q.3-4h.  I      am sending her home on p.o. Dilaudid and advising her to minimize this      use secondary to pregnancy.  She tells me she will try to do so,      although she states  that she does continue to have severe pain with      urination as well at times.  5.  In regard to  the patient's constipation, this is likely secondary to her      narcotics.  I started her on a bowel regimen.  She moved her bowels and      has been feeling better.   The patient is being discharged to home.  Overall disposition is improved.      Hollice Espy, M.D.  Electronically Signed     SKK/MEDQ  D:  04/15/2005  T:  04/17/2005  Job:  [id number]   cc:   Malva Limes, M.D.  Fax: [phone]   Jodi Marble. Fredia Sorrow, M.D.  Fax: [phone]   Valetta Fuller, M.D.  Fax: [phone]   Martina Sinner, MD  Fax: [phone]

## 2010-09-08 NOTE — Op Note (Signed)
NAMEMarland Kitchen  OKIE, JANSSON NO.:  [id number]   MEDICAL RECORD NO.:  [id number]          PATIENT TYPE:  INP   LOCATION:                                FACILITY:  [facility]   PHYSICIAN:  Ginger Carne, MD  DATE OF BIRTH:  10-21-1970   DATE OF PROCEDURE:  09/16/2005  DATE OF DISCHARGE:                                 OPERATIVE REPORT   PREOPERATIVE DIAGNOSIS:  Postpartum sterilization.   POSTOPERATIVE DIAGNOSIS:  Postpartum sterilization.   PROCEDURE:  P[place]oy bilateral tubal ligation.   SURGEON:  Blima Rich, M.D.   ASSISTANT:  None.   COMPLICATIONS:  None immediate.   ESTIMATED BLOOD LOSS:  Minimal.   ANESTHESIA:  Epidural.   SPECIMEN:  Portions of right and left tubes.   OPERATIVE FINDINGS:  The patient demonstrated normal postpartum/decidual  changes of uterus, tubes and ovaries.  Both tubes were identified from their  isthmus to fimbriated end, separate and apart from the respective round  ligaments on either side.   OPERATIVE PROCEDURE:  The patient prepped and draped in the usual fashion  and placed in the supine position.  Betadine solution used for antiseptic,  and the patient had voided prior to her procedure.  After adequate epidural  analgesia, a small vertical infraumbilical incision was made and the abdomen  opened.  Both tubes were once again identified and separated apart from  their respective round ligaments from their isthmus to fimbriated ends.  Approximately 2 to 3 cm of tube in the isthmus ampullary region were grasped  and tied at their base as a loop with 2-0 plain catgut sutures twice.  Tubes  were then cut above said knots, tips cauterized, no active bleeding noted.  Closure of the fascia in one layer of 0 Vicryl running suture and 4-0  Monocryl for subcuticular closure.  Instrument and sponge counts were  correct.  The patient tolerated the procedure well and returned to the  postanesthesia recovery room in excellent  condition.      Ginger Carne, MD  Electronically Signed    SHB/MEDQ  D:  09/16/2005  T:  09/17/2005  Job:  [id number]

## 2010-09-08 NOTE — Op Note (Signed)
[name], [name] NO.:  [id number]   MEDICAL RECORD NO.:  [id number]          PATIENT TYPE:  AMB   LOCATION:  DAY                          FACILITY:  [facility]   PHYSICIAN:  Heloise Purpura, MD      DATE OF BIRTH:  01-02-1971   DATE OF PROCEDURE:  DATE OF DISCHARGE:                                 OPERATIVE REPORT   DATE OF PROCEDURE:  10/16/2005.   PREOPERATIVE DIAGNOSES:  1.  Right renal calculus.  2.  Right flank pain.  3.  Suprapubic pain.   POSTOPERATIVE DIAGNOSES:  1.  Right renal calculus.  2.  Right flank pain.  3.  Suprapubic pain.   PROCEDURE:  1.  Right percutaneous nephrolithotomy (second look).  2.  Removal of the ureteral stent and the nephrostomy tube and placement of      nephro-ureteral stent.   SURGEON:  Heloise Purpura, MD.   ANESTHESIA:  General.   COMPLICATIONS:  None.   ESTIMATED BLOOD LOSS:  Minimal.   INDICATION:  [name] is a 40 year old female who recently underwent a  percutaneous nephrolithotomy.  She was found to have an impacted stone at  the ureteropelvic junction requiring laser lithotripsy.  Due to the fact  that her stone was impacted, it was decided to place a stent across the  ureteropelvic junction to minimize stricture formation.  However, due to  suprapubic discomfort, the patient has been unable to tolerate her ureteral  stent.  She is therefore brought back to the operating room for a second  look procedure to ensure that there are no remaining stone fragments as well  as to remove her ureteral stent and place a nephro-ureteral stent that does  not go down to the bladder but would still provide stenting across the  ureteropelvic junction.  The risks and benefits of the above procedures were  discussed with the patient and she consented.   DESCRIPTION OF PROCEDURE:  The patient was taken to the operating room and a  general anesthetic was administered.  She was given preoperative  antibiotics, placed in  the prone position, and prepped and draped in the  usual sterile fashion.  A preoperative time out was performed.  Next, a  guidewire was inserted through the patient's 20-French Council tip catheter  into the renal pelvis.  The Council tip catheter was then removed.  The  flexible nephroscope was then used to perform nephroscopy.  The patient's  prior stent was able to be visualized and with flexible graspers brought out  to the level of the skin.  The Glidewire was then removed from the renal  pelvis and placed through the ureteral stent and down into the bladder under  fluoroscopic guidance.  The ureteral stent was removed.  Flexible  nephroscopy was then performed of the entire collecting system.  There were  no remaining stone fragments identified.  A 6-French ureteral stent was then  placed over the guidewire.  This was placed down to the level of the  mid/distal ureter but not into the bladder.  This wire was then removed and  the location was confirmed with fluoroscopy.  Contrast was injected and  there appeared to be good antegrade flow down the ureter into the bladder.  The stent was then secured to the skin with two silk sutures and a sterile  dressing was applied.  The patient appeared to tolerate the procedure well  without complications.  She was able to be extubated and transferred to the  recovery unit in satisfactory condition.           ______________________________  Heloise Purpura, MD  Electronically Signed     LB/MEDQ  D:  10/16/2005  T:  10/16/2005  Job:  [id number]

## 2010-09-08 NOTE — H&P (Signed)
NAMEMarland Kitchen  [name], [name] NO.:  [id number]   MEDICAL RECORD NO.:  [id number]           PATIENT TYPE:   LOCATION:                                 FACILITY:   PHYSICIAN:  Excell Seltzer. Annabell Howells, M.D.         DATE OF BIRTH:   DATE OF ADMISSION:  12/08/2005  DATE OF DISCHARGE:                                HISTORY & PHYSICAL   PRIMARY CARE PHYSICIAN:  Dr. Crecencio Mc.   CHIEF COMPLAINT:  Fever and chills.   HISTORY:  [name] is a 40 year old white female who has a right UPJ  stone and is currently managed with a percutaneous nephrostomy tube.  She is  scheduled for right percutaneous nephrolithotomy on October 10, 2005.  She has  been on Cipro for urinary tract infection but has fever and chills  subjectively through the day and increased pain.   ALLERGIES:  Pertinent for allergies to TORADOL, IMITREX and ANTIHISTAMINES.   CURRENT MEDICATIONS:  1.  Prozac 20 mg daily.  2.  Klonopin 0.25 mg t.i.d.  3.  Vicodin p.r.n.  4.  Cipro.   PAST MEDICAL HISTORY:  1.  Pertinent for depression.  2.  She is three weeks postpartum and has had tubal ligation.   SOCIAL HISTORY:  She smokes a half pack a day.  She denies alcohol.   FAMILY HISTORY:  Pertinent for urolithiasis, Tourette's, hypertension and  diabetes.   REVIEW OF SYSTEMS:  She has had the chills, increased right flank pain that  has not been controlled by 7.5 mg hydrocodone.  The pain is radiating to her  legs.  She has some nausea.  She has a little vaginal bleeding but she is  just shortly postpartum.  She does have some urethral pain with frequency  and urgency suggestive of a urinary infection.  She is otherwise without  complaint.   PHYSICAL EXAMINATION:  VITAL SIGNS:  Blood pressure is 130/92, pulse 110,  respirations 22, temperature 98.4.  GENERAL:  She is a well-developed, well-nourished white female who is in  mild distress with discomfort.  HEENT:  Normocephalic and atraumatic.  NECK:  Supple.  LUNGS:   Clear with normal effort.  HEART:  Regular rate and rhythm.  ABDOMEN:  Soft, flat with right upper quadrant and right CVA tenderness.  There is no mass, hepatosplenomegaly or hernias noted.  GENITOURINARY/RECTAL:  Not performed.  EXTREMITIES:  Full range of motion with no edema.  SKIN:  Warm and dry.  NEUROLOGICAL:  She is nonfocal.   I reviewed the office's notes from Dr. Vevelyn Royals initial history on October 04, 2005.  The patient has a 6 x 9 mm right renal calculous.   IMPRESSION:  Right ureteropelvic junction stone with right percutaneous  nephrostomy and possible febrile urinary tract infection with increased  pain.   PLAN:  I am going to stop her Cipro and change her to Rocephin 1 gm IV  q.24h. and we will admit her for the IV antibiotics and pain control.  I  felt this was prudent since she is on the  schedule for Wednesday for  percutaneous nephrolithotomy and I wanted to be able to maximize her  management.      Excell Seltzer. Annabell Howells, M.D.  Electronically Signed     JJW/MEDQ  D:  10/08/2005  T:  10/08/2005  Job:  [id number]   cc:   Crecencio Mc, M.D.  Fax: [phone]

## 2010-09-08 NOTE — Discharge Summary (Signed)
NAMEMICHAELA, [name] NO.:  [id number]   MEDICAL RECORD NO.:  [id number]          PATIENT TYPE:  INP   LOCATION:  1410                         FACILITY:  [facility]   PHYSICIAN:  Elliot Cousin, M.D.    DATE OF BIRTH:  04-Dec-1970   DATE OF ADMISSION:  05/31/2007  DATE OF DISCHARGE:  06/08/2007                               DISCHARGE SUMMARY   ADDENDUM:  Please see the previous discharge summary dictated by Dr.  Sherrie Mustache on June 08, 2007.  This is an addendum.   I was informed by the lab that the patient's bronchial specimens grew  out MRSA.  In review of all of the smears and cultures that were pending  at the time of hospital discharge, the bronchial lavage specimens were  positive for moderate MRSA, rare wbc's, rare squamous epithelial cells,  and rare gram-positive cocci.  The Pneumocystis smear was negative.  The  fungus smear revealed no yeast or fungal elements; however, the culture  results are still in progress.  The AFB smear revealed no acid-fast  bacilli; however, the culture results are still pending.   I discussed the findings of MRSA with infectious diseases physician Dr.  Orvan Falconer.  I informed Dr. Orvan Falconer that the patient had improved  clinically during the hospital course and that the follow-up chest x-ray  did show some improvement.  I also informed him that the patient was  completely afebrile for the past 4 or 5 days of hospitalization and her  white blood cell count was within normal limits.  Dr. Orvan Falconer was also  informed that the patient received a total of 9 days of antibiotic  treatment with Avelox.  In hearing this, he recommended no further  treatment as, more than likely, the patient has a colonization of MRSA,  which appears to be on the rise in the community.  He would treat the  finding as if it was normal oral pharyngeal flora.  Therefore, the  patient will not be treated with additional antibiotics.      Elliot Cousin, M.D.  Electronically Signed     DF/MEDQ  D:  06/10/2007  T:  06/10/2007  Job:  [id number]

## 2010-09-08 NOTE — Op Note (Signed)
NAMELIN, HACKMANN NO.:  [id number]   MEDICAL RECORD NO.:  [id number]          PATIENT TYPE:  INP   LOCATION:  1515                         FACILITY:  [facility]   PHYSICIAN:  Valetta Fuller, M.D.  DATE OF BIRTH:  02-23-1971   DATE OF PROCEDURE:  01/13/2005  DATE OF DISCHARGE:  01/13/2005                                 OPERATIVE REPORT   PREOPERATIVE DIAGNOSIS:  Right proximal ureteral calculus 10 mm.   POSTOPERATIVE DIAGNOSIS:  Right proximal ureteral calculus 10 mm.   PROCEDURE PERFORMED:  1.  Cystoscopy.  2.  Right retrograde pyelography.  3.  Right right double-J stent placement.   SURGEON:  Dr. Isabel Caprice   ANESTHESIA:  General.   INDICATIONS:  [name] is a 40 year old female with one apparent history  of previous nephrolithiasis.  She presented last night to the [facility]  Emergency Room with severe right flank pain.  Stone protocol CT showed a 10  mm right proximal stone with obstruction.  Her pain was unrelenting, and she  was admitted for pain control.  She now will undergo stent placement to  temporize her situation before definitive management, probably with  lithotripsy will be done in the future.  The patient appeared to understand  the advantages and disadvantages of this approach, and full informed consent  was obtained.   TECHNIQUE AND FINDINGS:  The patient was brought to the operating room where  she had successful induction of general endotracheal anesthesia.  She was  placed in lithotomy position and prepped and draped in the usual manner.  Cystoscopy was unremarkable.  Retrograde pyelography was done with an 8-  French cone tip catheter and showed a high-grade obstruction and obvious 10  mm stone that was easily visualized on plain fluoro in the proximal ureter.  There was moderate dilation of the calyces and pelvis of the kidney.   At the completion of retrograde, a Glidewire was placed past the stone.  We  then placed a 6-French  24 cm double-J stent without difficulty, and good  position was confirmed with fluoroscopy as well as direct visual guidance.  She appeared to tolerate the procedure well.  There were no obvious  complications.           ______________________________  Valetta Fuller, M.D.     DSG/MEDQ  D:  01/13/2005  T:  01/13/2005  Job:  [id number]

## 2010-10-30 ENCOUNTER — Encounter (HOSPITAL_COMMUNITY): Payer: Self-pay

## 2010-10-30 ENCOUNTER — Encounter (HOSPITAL_COMMUNITY)
Admission: RE | Admit: 2010-10-30 | Discharge: 2010-10-30 | Disposition: A | Discharge status: Home / Self Care | Payer: Medicaid Other | Source: Ambulatory Visit | Attending: Urology | Admitting: Urology

## 2010-10-30 DIAGNOSIS — N135 Crossing vessel and stricture of ureter without hydronephrosis: Secondary | ICD-10-CM

## 2010-10-30 HISTORY — DX: Disorder of kidney and ureter, unspecified: N28.9

## 2010-10-30 MED ORDER — TECHNETIUM TC 99M MERTIATIDE
15.9000 | Freq: Once | INTRAVENOUS | Status: AC | PRN
  Administered 2010-10-30: 15.9 via INTRAVENOUS

## 2010-11-05 ENCOUNTER — Emergency Department (HOSPITAL_COMMUNITY)
Admission: EM | Admit: 2010-11-05 | Discharge: 2010-11-05 | Disposition: A | Discharge status: Home / Self Care | Payer: Medicaid Other | Attending: Emergency Medicine | Admitting: Emergency Medicine

## 2010-11-05 DIAGNOSIS — I498 Other specified cardiac arrhythmias: Secondary | ICD-10-CM | POA: Insufficient documentation

## 2010-11-05 DIAGNOSIS — M542 Cervicalgia: Secondary | ICD-10-CM | POA: Insufficient documentation

## 2010-11-05 DIAGNOSIS — R51 Headache: Secondary | ICD-10-CM | POA: Insufficient documentation

## 2010-12-16 ENCOUNTER — Emergency Department (HOSPITAL_COMMUNITY): Payer: Medicaid Other

## 2010-12-16 ENCOUNTER — Emergency Department (HOSPITAL_COMMUNITY)
Admission: EM | Admit: 2010-12-16 | Discharge: 2010-12-16 | Disposition: A | Discharge status: Home / Self Care | Payer: Medicaid Other | Attending: Emergency Medicine | Admitting: Emergency Medicine

## 2010-12-16 DIAGNOSIS — F988 Other specified behavioral and emotional disorders with onset usually occurring in childhood and adolescence: Secondary | ICD-10-CM | POA: Insufficient documentation

## 2010-12-16 DIAGNOSIS — R109 Unspecified abdominal pain: Secondary | ICD-10-CM | POA: Insufficient documentation

## 2010-12-16 DIAGNOSIS — K219 Gastro-esophageal reflux disease without esophagitis: Secondary | ICD-10-CM | POA: Insufficient documentation

## 2010-12-16 DIAGNOSIS — Z87442 Personal history of urinary calculi: Secondary | ICD-10-CM | POA: Insufficient documentation

## 2010-12-16 DIAGNOSIS — R42 Dizziness and giddiness: Secondary | ICD-10-CM | POA: Insufficient documentation

## 2010-12-16 DIAGNOSIS — R112 Nausea with vomiting, unspecified: Secondary | ICD-10-CM | POA: Insufficient documentation

## 2010-12-16 DIAGNOSIS — K279 Peptic ulcer, site unspecified, unspecified as acute or chronic, without hemorrhage or perforation: Secondary | ICD-10-CM | POA: Insufficient documentation

## 2010-12-16 DIAGNOSIS — A499 Bacterial infection, unspecified: Secondary | ICD-10-CM | POA: Insufficient documentation

## 2010-12-16 DIAGNOSIS — F411 Generalized anxiety disorder: Secondary | ICD-10-CM | POA: Insufficient documentation

## 2010-12-16 DIAGNOSIS — B9689 Other specified bacterial agents as the cause of diseases classified elsewhere: Secondary | ICD-10-CM | POA: Insufficient documentation

## 2010-12-16 DIAGNOSIS — N760 Acute vaginitis: Secondary | ICD-10-CM | POA: Insufficient documentation

## 2010-12-16 DIAGNOSIS — IMO0002 Reserved for concepts with insufficient information to code with codable children: Secondary | ICD-10-CM | POA: Insufficient documentation

## 2010-12-16 LAB — URINALYSIS, ROUTINE W REFLEX MICROSCOPIC
Bilirubin Urine: NEGATIVE
Hgb urine dipstick: NEGATIVE
Ketones, ur: 15 mg/dL — AB
Nitrite: NEGATIVE
Protein, ur: NEGATIVE mg/dL
Specific Gravity, Urine: 1.03 (ref 1.005–1.030)
Urobilinogen, UA: 0.2 mg/dL (ref 0.0–1.0)

## 2010-12-16 LAB — CBC
MCH: 29.1 pg (ref 26.0–34.0)
MCHC: 32.7 g/dL (ref 30.0–36.0)
MCV: 88.8 fL (ref 78.0–100.0)
Platelets: ADEQUATE 10*3/uL (ref 150–400)
RBC: 4.37 MIL/uL (ref 3.87–5.11)

## 2010-12-16 LAB — DIFFERENTIAL
Basophils Absolute: 0.1 10*3/uL (ref 0.0–0.1)
Basophils Relative: 1 % (ref 0–1)
Eosinophils Absolute: 0.1 10*3/uL (ref 0.0–0.7)
Lymphocytes Relative: 32 % (ref 12–46)
Lymphs Abs: 2.6 10*3/uL (ref 0.7–4.0)
Monocytes Absolute: 0.5 10*3/uL (ref 0.1–1.0)
Neutro Abs: 4.8 10*3/uL (ref 1.7–7.7)

## 2010-12-16 LAB — WET PREP, GENITAL
WBC, Wet Prep HPF POC: NONE SEEN
Yeast Wet Prep HPF POC: NONE SEEN

## 2010-12-16 MED ORDER — IOHEXOL 300 MG/ML  SOLN
100.0000 mL | Freq: Once | INTRAMUSCULAR | Status: AC | PRN
  Administered 2010-12-16: 100 mL via INTRAVENOUS

## 2010-12-17 LAB — URINE CULTURE: Culture  Setup Time: 201208252016

## 2010-12-18 LAB — GC/CHLAMYDIA PROBE AMP, GENITAL: GC Probe Amp, Genital: NEGATIVE

## 2010-12-26 ENCOUNTER — Emergency Department (HOSPITAL_COMMUNITY): Payer: Medicaid Other

## 2010-12-26 ENCOUNTER — Emergency Department (HOSPITAL_COMMUNITY)
Admission: EM | Admit: 2010-12-26 | Discharge: 2010-12-27 | Disposition: A | Discharge status: Home / Self Care | Payer: Medicaid Other | Attending: Emergency Medicine | Admitting: Emergency Medicine

## 2010-12-26 DIAGNOSIS — R110 Nausea: Secondary | ICD-10-CM | POA: Insufficient documentation

## 2010-12-26 DIAGNOSIS — Z8711 Personal history of peptic ulcer disease: Secondary | ICD-10-CM | POA: Insufficient documentation

## 2010-12-26 DIAGNOSIS — R109 Unspecified abdominal pain: Secondary | ICD-10-CM | POA: Insufficient documentation

## 2010-12-26 LAB — URINALYSIS, ROUTINE W REFLEX MICROSCOPIC
Bilirubin Urine: NEGATIVE
Hgb urine dipstick: NEGATIVE
Ketones, ur: NEGATIVE mg/dL
Specific Gravity, Urine: 1.023 (ref 1.005–1.030)
Urobilinogen, UA: 0.2 mg/dL (ref 0.0–1.0)
pH: 6.5 (ref 5.0–8.0)

## 2010-12-26 LAB — BASIC METABOLIC PANEL
BUN: 10 mg/dL (ref 6–23)
Calcium: 8.9 mg/dL (ref 8.4–10.5)
Creatinine, Ser: 0.8 mg/dL (ref 0.50–1.10)
GFR calc non Af Amer: >60 mL/min (ref >60)
Glucose, Bld: 62 mg/dL — ABNORMAL LOW (ref 70–99)
Sodium: 139 mEq/L (ref 135–145)

## 2010-12-26 LAB — CBC
Hemoglobin: 10.2 g/dL — ABNORMAL LOW (ref 12.0–15.0)
MCH: 28.7 pg (ref 26.0–34.0)
MCHC: 32.4 g/dL (ref 30.0–36.0)
MCV: 88.5 fL (ref 78.0–100.0)

## 2010-12-26 LAB — DIFFERENTIAL
Basophils Relative: 0 % (ref 0–1)
Lymphs Abs: 2.6 10*3/uL (ref 0.7–4.0)
Monocytes Absolute: 0.9 10*3/uL (ref 0.1–1.0)
Monocytes Relative: 11 % (ref 3–12)
Neutro Abs: 5.2 10*3/uL (ref 1.7–7.7)

## 2010-12-31 ENCOUNTER — Emergency Department (HOSPITAL_COMMUNITY)
Admission: EM | Admit: 2010-12-31 | Discharge: 2011-01-01 | Disposition: A | Discharge status: Home / Self Care | Payer: Self-pay | Attending: Emergency Medicine | Admitting: Emergency Medicine

## 2010-12-31 ENCOUNTER — Emergency Department (HOSPITAL_COMMUNITY): Payer: Self-pay

## 2010-12-31 DIAGNOSIS — IMO0002 Reserved for concepts with insufficient information to code with codable children: Secondary | ICD-10-CM | POA: Insufficient documentation

## 2010-12-31 DIAGNOSIS — R4182 Altered mental status, unspecified: Secondary | ICD-10-CM | POA: Insufficient documentation

## 2010-12-31 DIAGNOSIS — F1010 Alcohol abuse, uncomplicated: Secondary | ICD-10-CM | POA: Insufficient documentation

## 2010-12-31 DIAGNOSIS — R51 Headache: Secondary | ICD-10-CM | POA: Insufficient documentation

## 2010-12-31 LAB — DIFFERENTIAL
Basophils Absolute: 0 10*3/uL (ref 0.0–0.1)
Basophils Relative: 0 % (ref 0–1)
Eosinophils Absolute: 0.6 10*3/uL (ref 0.0–0.7)
Eosinophils Relative: 10 % — ABNORMAL HIGH (ref 0–5)
Lymphocytes Relative: 27 % (ref 12–46)
Lymphs Abs: 1.7 10*3/uL (ref 0.7–4.0)
Monocytes Absolute: 0.5 10*3/uL (ref 0.1–1.0)
Monocytes Relative: 8 % (ref 3–12)
Neutro Abs: 3.3 10*3/uL (ref 1.7–7.7)
Neutrophils Relative %: 54 % (ref 43–77)

## 2010-12-31 LAB — COMPREHENSIVE METABOLIC PANEL
AST: 16 U/L (ref 0–37)
Albumin: 2.6 g/dL — ABNORMAL LOW (ref 3.5–5.2)
Alkaline Phosphatase: 73 U/L (ref 39–117)
CO2: 21 mEq/L (ref 19–32)
Chloride: 109 mEq/L (ref 96–112)
Creatinine, Ser: 0.58 mg/dL (ref 0.50–1.10)
GFR calc non Af Amer: >60 mL/min (ref >60)
Potassium: 3.3 mEq/L — ABNORMAL LOW (ref 3.5–5.1)
Total Bilirubin: 0.1 mg/dL — ABNORMAL LOW (ref 0.3–1.2)

## 2010-12-31 LAB — URINALYSIS, ROUTINE W REFLEX MICROSCOPIC
Bilirubin Urine: NEGATIVE
Glucose, UA: 100 mg/dL — AB
Hgb urine dipstick: NEGATIVE
Ketones, ur: NEGATIVE mg/dL
Protein, ur: NEGATIVE mg/dL
pH: 7 (ref 5.0–8.0)

## 2010-12-31 LAB — GLUCOSE, CAPILLARY: Glucose-Capillary: 106 mg/dL — ABNORMAL HIGH (ref 70–99)

## 2010-12-31 LAB — PROTIME-INR
INR: 1.01 (ref 0.00–1.49)
Prothrombin Time: 13.5 seconds (ref 11.6–15.2)

## 2010-12-31 LAB — CBC
HCT: 31.5 % — ABNORMAL LOW (ref 36.0–46.0)
MCH: 28.6 pg (ref 26.0–34.0)
MCV: 89.2 fL (ref 78.0–100.0)
Platelets: 371 10*3/uL (ref 150–400)
RBC: 3.53 MIL/uL — ABNORMAL LOW (ref 3.87–5.11)
RDW: 15.6 % — ABNORMAL HIGH (ref 11.5–15.5)
WBC: 6.1 10*3/uL (ref 4.0–10.5)

## 2010-12-31 LAB — POCT I-STAT TROPONIN I: Troponin i, poc: 0 ng/mL (ref 0.00–0.08)

## 2010-12-31 LAB — LACTIC ACID, PLASMA: Lactic Acid, Venous: 1.3 mmol/L (ref 0.5–2.2)

## 2010-12-31 LAB — AMMONIA: Ammonia: 22 umol/L (ref 11–60)

## 2010-12-31 LAB — RAPID URINE DRUG SCREEN, HOSP PERFORMED
Amphetamines: NOT DETECTED
Benzodiazepines: POSITIVE — AB
Tetrahydrocannabinol: NOT DETECTED

## 2010-12-31 LAB — POCT PREGNANCY, URINE: Preg Test, Ur: NEGATIVE

## 2010-12-31 LAB — ACETAMINOPHEN LEVEL: Acetaminophen (Tylenol), Serum: <15 ug/mL (ref 10–30)

## 2010-12-31 LAB — LIPASE, BLOOD: Lipase: 14 U/L (ref 11–59)

## 2011-01-02 LAB — URINE CULTURE
Colony Count: NO GROWTH
Culture  Setup Time: 201209100149
Culture: NO GROWTH

## 2011-01-05 ENCOUNTER — Emergency Department (HOSPITAL_COMMUNITY): Payer: Medicaid Other

## 2011-01-05 ENCOUNTER — Emergency Department (HOSPITAL_COMMUNITY)
Admission: EM | Admit: 2011-01-05 | Discharge: 2011-01-05 | Disposition: A | Discharge status: Home / Self Care | Payer: Medicaid Other | Attending: Emergency Medicine | Admitting: Emergency Medicine

## 2011-01-05 DIAGNOSIS — IMO0002 Reserved for concepts with insufficient information to code with codable children: Secondary | ICD-10-CM | POA: Insufficient documentation

## 2011-01-05 DIAGNOSIS — F988 Other specified behavioral and emotional disorders with onset usually occurring in childhood and adolescence: Secondary | ICD-10-CM | POA: Insufficient documentation

## 2011-01-05 DIAGNOSIS — F952 Tourette's disorder: Secondary | ICD-10-CM | POA: Insufficient documentation

## 2011-01-05 DIAGNOSIS — F411 Generalized anxiety disorder: Secondary | ICD-10-CM | POA: Insufficient documentation

## 2011-01-05 DIAGNOSIS — Z87442 Personal history of urinary calculi: Secondary | ICD-10-CM | POA: Insufficient documentation

## 2011-01-05 DIAGNOSIS — M4120 Other idiopathic scoliosis, site unspecified: Secondary | ICD-10-CM | POA: Insufficient documentation

## 2011-01-05 DIAGNOSIS — R109 Unspecified abdominal pain: Secondary | ICD-10-CM | POA: Insufficient documentation

## 2011-01-05 DIAGNOSIS — R300 Dysuria: Secondary | ICD-10-CM | POA: Insufficient documentation

## 2011-01-05 DIAGNOSIS — R1011 Right upper quadrant pain: Secondary | ICD-10-CM | POA: Insufficient documentation

## 2011-01-05 DIAGNOSIS — K219 Gastro-esophageal reflux disease without esophagitis: Secondary | ICD-10-CM | POA: Insufficient documentation

## 2011-01-05 LAB — DIFFERENTIAL
Basophils Absolute: 0.1 10*3/uL (ref 0.0–0.1)
Basophils Relative: 1 % (ref 0–1)
Eosinophils Absolute: 0.8 10*3/uL — ABNORMAL HIGH (ref 0.0–0.7)
Eosinophils Relative: 8 % — ABNORMAL HIGH (ref 0–5)
Monocytes Absolute: 0.4 10*3/uL (ref 0.1–1.0)

## 2011-01-05 LAB — COMPREHENSIVE METABOLIC PANEL
AST: 16 U/L (ref 0–37)
BUN: 10 mg/dL (ref 6–23)
CO2: 27 mEq/L (ref 19–32)
Calcium: 9.6 mg/dL (ref 8.4–10.5)
Creatinine, Ser: 0.62 mg/dL (ref 0.50–1.10)
GFR calc Af Amer: >60 mL/min (ref >60)
GFR calc non Af Amer: >60 mL/min (ref >60)
Glucose, Bld: 102 mg/dL — ABNORMAL HIGH (ref 70–99)

## 2011-01-05 LAB — CBC
Hemoglobin: 11 g/dL — ABNORMAL LOW (ref 12.0–15.0)
MCH: 28.7 pg (ref 26.0–34.0)
MCHC: 32.7 g/dL (ref 30.0–36.0)
Platelets: 410 10*3/uL — ABNORMAL HIGH (ref 150–400)

## 2011-01-05 LAB — URINALYSIS, ROUTINE W REFLEX MICROSCOPIC
Bilirubin Urine: NEGATIVE
Glucose, UA: NEGATIVE mg/dL
Ketones, ur: NEGATIVE mg/dL
Leukocytes, UA: NEGATIVE
Protein, ur: NEGATIVE mg/dL

## 2011-01-05 LAB — LIPASE, BLOOD: Lipase: 45 U/L (ref 11–59)

## 2011-01-06 ENCOUNTER — Emergency Department (HOSPITAL_COMMUNITY)
Admission: EM | Admit: 2011-01-06 | Discharge: 2011-01-06 | Disposition: A | Discharge status: Home / Self Care | Payer: Medicaid Other | Attending: Emergency Medicine | Admitting: Emergency Medicine

## 2011-01-06 DIAGNOSIS — R109 Unspecified abdominal pain: Secondary | ICD-10-CM | POA: Insufficient documentation

## 2011-01-06 DIAGNOSIS — M7989 Other specified soft tissue disorders: Secondary | ICD-10-CM | POA: Insufficient documentation

## 2011-01-06 DIAGNOSIS — M549 Dorsalgia, unspecified: Secondary | ICD-10-CM | POA: Insufficient documentation

## 2011-01-06 LAB — BASIC METABOLIC PANEL
CO2: 30 mEq/L (ref 19–32)
Glucose, Bld: 76 mg/dL (ref 70–99)
Potassium: 4.3 mEq/L (ref 3.5–5.1)
Sodium: 134 mEq/L — ABNORMAL LOW (ref 135–145)

## 2011-01-06 LAB — DIFFERENTIAL
Basophils Absolute: 0 10*3/uL (ref 0.0–0.1)
Basophils Relative: 0 % (ref 0–1)
Eosinophils Absolute: 0.7 10*3/uL (ref 0.0–0.7)
Neutro Abs: 3.6 10*3/uL (ref 1.7–7.7)
Neutrophils Relative %: 49 % (ref 43–77)

## 2011-01-06 LAB — CBC
Hemoglobin: 11.3 g/dL — ABNORMAL LOW (ref 12.0–15.0)
Platelets: 388 10*3/uL (ref 150–400)
RBC: 3.91 MIL/uL (ref 3.87–5.11)
WBC: 7.4 10*3/uL (ref 4.0–10.5)

## 2011-01-09 ENCOUNTER — Emergency Department (HOSPITAL_COMMUNITY): Payer: Medicaid Other

## 2011-01-09 ENCOUNTER — Inpatient Hospital Stay (HOSPITAL_COMMUNITY)
Admission: EM | Admit: 2011-01-09 | Discharge: 2011-01-16 | DRG: 208 | Disposition: A | Discharge status: Home / Self Care | Payer: Medicaid Other | Attending: Internal Medicine | Admitting: Internal Medicine

## 2011-01-09 DIAGNOSIS — T4271XA Poisoning by unspecified antiepileptic and sedative-hypnotic drugs, accidental (unintentional), initial encounter: Secondary | ICD-10-CM | POA: Diagnosis present

## 2011-01-09 DIAGNOSIS — J9600 Acute respiratory failure, unspecified whether with hypoxia or hypercapnia: Principal | ICD-10-CM | POA: Diagnosis present

## 2011-01-09 DIAGNOSIS — I200 Unstable angina: Secondary | ICD-10-CM | POA: Diagnosis present

## 2011-01-09 DIAGNOSIS — J45909 Unspecified asthma, uncomplicated: Secondary | ICD-10-CM | POA: Diagnosis present

## 2011-01-09 DIAGNOSIS — F1410 Cocaine abuse, uncomplicated: Secondary | ICD-10-CM | POA: Diagnosis present

## 2011-01-09 DIAGNOSIS — IMO0002 Reserved for concepts with insufficient information to code with codable children: Secondary | ICD-10-CM | POA: Diagnosis present

## 2011-01-09 DIAGNOSIS — F1910 Other psychoactive substance abuse, uncomplicated: Secondary | ICD-10-CM | POA: Diagnosis present

## 2011-01-09 DIAGNOSIS — J690 Pneumonitis due to inhalation of food and vomit: Secondary | ICD-10-CM | POA: Diagnosis present

## 2011-01-09 DIAGNOSIS — F319 Bipolar disorder, unspecified: Secondary | ICD-10-CM | POA: Diagnosis present

## 2011-01-09 DIAGNOSIS — R7401 Elevation of levels of liver transaminase levels: Secondary | ICD-10-CM | POA: Diagnosis present

## 2011-01-09 DIAGNOSIS — N179 Acute kidney failure, unspecified: Secondary | ICD-10-CM | POA: Diagnosis present

## 2011-01-09 DIAGNOSIS — K219 Gastro-esophageal reflux disease without esophagitis: Secondary | ICD-10-CM | POA: Diagnosis present

## 2011-01-09 DIAGNOSIS — F952 Tourette's disorder: Secondary | ICD-10-CM | POA: Diagnosis present

## 2011-01-09 DIAGNOSIS — E872 Acidosis, unspecified: Secondary | ICD-10-CM | POA: Diagnosis present

## 2011-01-09 DIAGNOSIS — G9340 Encephalopathy, unspecified: Secondary | ICD-10-CM | POA: Diagnosis present

## 2011-01-09 DIAGNOSIS — E876 Hypokalemia: Secondary | ICD-10-CM | POA: Diagnosis not present

## 2011-01-09 DIAGNOSIS — T43601A Poisoning by unspecified psychostimulants, accidental (unintentional), initial encounter: Secondary | ICD-10-CM | POA: Diagnosis present

## 2011-01-09 DIAGNOSIS — F329 Major depressive disorder, single episode, unspecified: Secondary | ICD-10-CM | POA: Diagnosis present

## 2011-01-09 DIAGNOSIS — I498 Other specified cardiac arrhythmias: Secondary | ICD-10-CM | POA: Diagnosis present

## 2011-01-09 DIAGNOSIS — G43909 Migraine, unspecified, not intractable, without status migrainosus: Secondary | ICD-10-CM | POA: Diagnosis present

## 2011-01-09 DIAGNOSIS — R7402 Elevation of levels of lactic acid dehydrogenase (LDH): Secondary | ICD-10-CM | POA: Diagnosis present

## 2011-01-09 DIAGNOSIS — F988 Other specified behavioral and emotional disorders with onset usually occurring in childhood and adolescence: Secondary | ICD-10-CM | POA: Diagnosis present

## 2011-01-09 DIAGNOSIS — K922 Gastrointestinal hemorrhage, unspecified: Secondary | ICD-10-CM | POA: Diagnosis present

## 2011-01-09 DIAGNOSIS — T405X1A Poisoning by cocaine, accidental (unintentional), initial encounter: Secondary | ICD-10-CM | POA: Diagnosis present

## 2011-01-09 DIAGNOSIS — F17200 Nicotine dependence, unspecified, uncomplicated: Secondary | ICD-10-CM | POA: Diagnosis present

## 2011-01-09 DIAGNOSIS — F411 Generalized anxiety disorder: Secondary | ICD-10-CM | POA: Diagnosis present

## 2011-01-09 DIAGNOSIS — Z8701 Personal history of pneumonia (recurrent): Secondary | ICD-10-CM

## 2011-01-09 DIAGNOSIS — K769 Liver disease, unspecified: Secondary | ICD-10-CM | POA: Diagnosis present

## 2011-01-09 DIAGNOSIS — Z888 Allergy status to other drugs, medicaments and biological substances status: Secondary | ICD-10-CM

## 2011-01-09 DIAGNOSIS — G8929 Other chronic pain: Secondary | ICD-10-CM | POA: Diagnosis present

## 2011-01-09 LAB — LACTIC ACID, PLASMA: Lactic Acid, Venous: 9.6 mmol/L — ABNORMAL HIGH (ref 0.5–2.2)

## 2011-01-09 LAB — URINALYSIS, ROUTINE W REFLEX MICROSCOPIC
Bilirubin Urine: NEGATIVE
Hgb urine dipstick: NEGATIVE
Nitrite: NEGATIVE
Specific Gravity, Urine: 1.025 (ref 1.005–1.030)
Urobilinogen, UA: 0.2 mg/dL (ref 0.0–1.0)

## 2011-01-09 LAB — CBC
HCT: 35.9 % — ABNORMAL LOW (ref 36.0–46.0)
Hemoglobin: 11.2 g/dL — ABNORMAL LOW (ref 12.0–15.0)
MCHC: 31.2 g/dL (ref 30.0–36.0)
RDW: 16.5 % — ABNORMAL HIGH (ref 11.5–15.5)
WBC: 21.8 10*3/uL — ABNORMAL HIGH (ref 4.0–10.5)

## 2011-01-09 LAB — POCT PREGNANCY, URINE: Preg Test, Ur: NEGATIVE

## 2011-01-09 LAB — BLOOD GAS, ARTERIAL
Acid-base deficit: 10.2 mmol/L — ABNORMAL HIGH (ref 0.0–2.0)
Bicarbonate: 17.7 mEq/L — ABNORMAL LOW (ref 20.0–24.0)
FIO2: 1 %
O2 Saturation: 98.9 %
Patient temperature: 37
TCO2: 17.4 mmol/L (ref 0–100)
pO2, Arterial: 254 mmHg — ABNORMAL HIGH (ref 80.0–100.0)

## 2011-01-09 LAB — D-DIMER, QUANTITATIVE: D-Dimer, Quant: 2.31 ug/mL-FEU — ABNORMAL HIGH (ref 0.00–0.48)

## 2011-01-09 LAB — RAPID URINE DRUG SCREEN, HOSP PERFORMED
Amphetamines: NOT DETECTED
Benzodiazepines: POSITIVE — AB
Cocaine: POSITIVE — AB
Opiates: POSITIVE — AB
Tetrahydrocannabinol: NOT DETECTED

## 2011-01-09 LAB — COMPREHENSIVE METABOLIC PANEL
ALT: 587 U/L — ABNORMAL HIGH (ref 0–35)
AST: 1365 U/L — ABNORMAL HIGH (ref 0–37)
Albumin: 3.1 g/dL — ABNORMAL LOW (ref 3.5–5.2)
Alkaline Phosphatase: 152 U/L — ABNORMAL HIGH (ref 39–117)
BUN: 27 mg/dL — ABNORMAL HIGH (ref 6–23)
Chloride: 100 mEq/L (ref 96–112)
Potassium: 5.8 mEq/L — ABNORMAL HIGH (ref 3.5–5.1)
Sodium: 137 mEq/L (ref 135–145)
Total Bilirubin: 0.3 mg/dL (ref 0.3–1.2)
Total Protein: 7.1 g/dL (ref 6.0–8.3)

## 2011-01-09 LAB — URINE MICROSCOPIC-ADD ON

## 2011-01-09 LAB — POCT I-STAT TROPONIN I

## 2011-01-09 LAB — CK TOTAL AND CKMB (NOT AT ARMC): Relative Index: 3.8 — ABNORMAL HIGH (ref 0.0–2.5)

## 2011-01-09 LAB — GLUCOSE, CAPILLARY: Glucose-Capillary: 139 mg/dL — ABNORMAL HIGH (ref 70–99)

## 2011-01-09 LAB — TYPE AND SCREEN: Antibody Screen: NEGATIVE

## 2011-01-09 LAB — PROTIME-INR
INR: 1.5 — ABNORMAL HIGH (ref 0.00–1.49)
Prothrombin Time: 18.4 seconds — ABNORMAL HIGH (ref 11.6–15.2)

## 2011-01-09 LAB — ETHANOL: Alcohol, Ethyl (B): <11 mg/dL (ref 0–11)

## 2011-01-09 LAB — TROPONIN I: Troponin I: 0.76 ng/mL (ref <0.30)

## 2011-01-10 ENCOUNTER — Inpatient Hospital Stay (HOSPITAL_COMMUNITY): Payer: Medicaid Other

## 2011-01-10 DIAGNOSIS — G9340 Encephalopathy, unspecified: Secondary | ICD-10-CM

## 2011-01-10 DIAGNOSIS — I059 Rheumatic mitral valve disease, unspecified: Secondary | ICD-10-CM

## 2011-01-10 DIAGNOSIS — T405X1A Poisoning by cocaine, accidental (unintentional), initial encounter: Secondary | ICD-10-CM

## 2011-01-10 DIAGNOSIS — J9600 Acute respiratory failure, unspecified whether with hypoxia or hypercapnia: Secondary | ICD-10-CM

## 2011-01-10 LAB — BASIC METABOLIC PANEL
Calcium: 8.2 mg/dL — ABNORMAL LOW (ref 8.4–10.5)
Creatinine, Ser: 0.9 mg/dL (ref 0.50–1.10)
GFR calc Af Amer: >60 mL/min (ref >60)
GFR calc non Af Amer: >60 mL/min (ref >60)

## 2011-01-10 LAB — BLOOD GAS, ARTERIAL
Acid-Base Excess: 4.9 mmol/L — ABNORMAL HIGH (ref 0.0–2.0)
Acid-base deficit: 2.2 mmol/L — ABNORMAL HIGH (ref 0.0–2.0)
Acid-base deficit: 7.2 mmol/L — ABNORMAL HIGH (ref 0.0–2.0)
Acid-base deficit: 9.5 mmol/L — ABNORMAL HIGH (ref 0.0–2.0)
Bicarbonate: 20.3 mEq/L (ref 20.0–24.0)
Drawn by: 331761
Drawn by: 331761
Drawn by: 340271
FIO2: 0.4 %
FIO2: 0.4 %
FIO2: 0.4 %
FIO2: 0.4 %
MECHVT: 370 mL
MECHVT: 420 mL
MECHVT: 420 mL
O2 Saturation: 85 %
O2 Saturation: 90.1 %
O2 Saturation: 93.3 %
O2 Saturation: 95 %
O2 Saturation: 97.6 %
O2 Saturation: 98.5 %
PEEP: 5 cmH2O
PEEP: 5 cmH2O
Patient temperature: 37
Patient temperature: 98.6
Patient temperature: 98.6
Patient temperature: 98.6
Patient temperature: 99.1
RATE: 14 resp/min
RATE: 22 resp/min
RATE: 30 resp/min
RATE: 30 resp/min
TCO2: 18.2 mmol/L (ref 0–100)
TCO2: 20 mmol/L (ref 0–100)
pCO2 arterial: 53.6 mmHg — ABNORMAL HIGH (ref 35.0–45.0)
pH, Arterial: 7.402 — ABNORMAL HIGH (ref 7.350–7.400)
pO2, Arterial: 70.4 mmHg — ABNORMAL LOW (ref 80.0–100.0)
pO2, Arterial: 79.3 mmHg — ABNORMAL LOW (ref 80.0–100.0)
pO2, Arterial: 76.8 mmHg — ABNORMAL LOW (ref 80.0–100.0)

## 2011-01-10 LAB — DIFFERENTIAL
Basophils Absolute: 0 10*3/uL (ref 0.0–0.1)
Eosinophils Relative: 0 % (ref 0–5)
Monocytes Absolute: 1.3 10*3/uL — ABNORMAL HIGH (ref 0.1–1.0)
Monocytes Relative: 6 % (ref 3–12)
Neutrophils Relative %: 88 % — ABNORMAL HIGH (ref 43–77)

## 2011-01-10 LAB — CARDIAC PANEL(CRET KIN+CKTOT+MB+TROPI)
CK, MB: 11.1 ng/mL (ref 0.3–4.0)
Troponin I: 1.47 ng/mL (ref <0.30)

## 2011-01-10 LAB — GASTRIC OCCULT BLOOD (1-CARD TO LAB)

## 2011-01-10 LAB — MAGNESIUM: Magnesium: 2.4 mg/dL (ref 1.5–2.5)

## 2011-01-10 LAB — LACTIC ACID, PLASMA: Lactic Acid, Venous: 1.6 mmol/L (ref 0.5–2.2)

## 2011-01-10 LAB — CBC
HCT: 30.6 % — ABNORMAL LOW (ref 36.0–46.0)
MCV: 89.5 fL (ref 78.0–100.0)
Platelets: 351 10*3/uL (ref 150–400)
RBC: 3.42 MIL/uL — ABNORMAL LOW (ref 3.87–5.11)
WBC: 21.2 10*3/uL — ABNORMAL HIGH (ref 4.0–10.5)

## 2011-01-11 ENCOUNTER — Inpatient Hospital Stay (HOSPITAL_COMMUNITY): Payer: Medicaid Other

## 2011-01-11 LAB — MYOGLOBIN, SERUM
Myoglobin: 287 ng/mL — ABNORMAL HIGH (ref <111)
Myoglobin: 156 ng/mL — ABNORMAL HIGH (ref <111)
Myoglobin: 87 ng/mL (ref <111)

## 2011-01-11 LAB — HEPATIC FUNCTION PANEL
AST: 2958 U/L — ABNORMAL HIGH (ref 0–37)
Bilirubin, Direct: 0.2 mg/dL (ref 0.0–0.3)
Indirect Bilirubin: 0.3 mg/dL (ref 0.3–0.9)

## 2011-01-11 LAB — CBC
MCV: 89.4 fL (ref 78.0–100.0)
Platelets: 318 10*3/uL (ref 150–400)
RDW: 15.7 % — ABNORMAL HIGH (ref 11.5–15.5)
WBC: 15.7 10*3/uL — ABNORMAL HIGH (ref 4.0–10.5)

## 2011-01-11 LAB — POTASSIUM: Potassium: 3 mEq/L — ABNORMAL LOW (ref 3.5–5.1)

## 2011-01-11 LAB — URINE CULTURE: Culture: NO GROWTH

## 2011-01-11 LAB — BASIC METABOLIC PANEL
BUN: 17 mg/dL (ref 6–23)
Chloride: 99 mEq/L (ref 96–112)
GFR calc Af Amer: >60 mL/min (ref >60)
Potassium: 2.7 mEq/L — CL (ref 3.5–5.1)

## 2011-01-11 LAB — MAGNESIUM: Magnesium: 1.9 mg/dL (ref 1.5–2.5)

## 2011-01-11 LAB — PHOSPHORUS: Phosphorus: 2.6 mg/dL (ref 2.3–4.6)

## 2011-01-12 ENCOUNTER — Inpatient Hospital Stay (HOSPITAL_COMMUNITY): Payer: Medicaid Other

## 2011-01-12 DIAGNOSIS — F39 Unspecified mood [affective] disorder: Secondary | ICD-10-CM

## 2011-01-12 LAB — URINALYSIS, ROUTINE W REFLEX MICROSCOPIC
Bilirubin Urine: NEGATIVE
Hgb urine dipstick: NEGATIVE
Ketones, ur: NEGATIVE
Protein, ur: NEGATIVE
Urobilinogen, UA: 0.2

## 2011-01-12 LAB — CBC
HCT: 29.8 — ABNORMAL LOW
HCT: 31 — ABNORMAL LOW
HCT: 36.4
Hemoglobin: 10.4 — ABNORMAL LOW
Hemoglobin: 12.1
MCV: 88.6
MCV: 88.7
Platelets: 339
Platelets: 390
Platelets: 433 — ABNORMAL HIGH
RBC: 3.38 — ABNORMAL LOW
RDW: 20.8 — ABNORMAL HIGH
RDW: 19.7 — ABNORMAL HIGH
WBC: 10.6 — ABNORMAL HIGH
WBC: 15.8 — ABNORMAL HIGH
WBC: 9.4

## 2011-01-12 LAB — DIFFERENTIAL
Lymphocytes Relative: 2 — ABNORMAL LOW
Lymphs Abs: 0.3 — ABNORMAL LOW
Monocytes Absolute: 0 — ABNORMAL LOW
Monocytes Relative: 0 — ABNORMAL LOW
Neutro Abs: 15.4 — ABNORMAL HIGH
Neutrophils Relative %: 98 — ABNORMAL HIGH

## 2011-01-12 LAB — URINE CULTURE: Colony Count: NO GROWTH

## 2011-01-12 LAB — BASIC METABOLIC PANEL
BUN: 9
BUN: 18
CO2: 33 mEq/L — ABNORMAL HIGH (ref 19–32)
Calcium: 8.4 mg/dL (ref 8.4–10.5)
Calcium: 8.7
Calcium: 8.9
Chloride: 106
Creatinine, Ser: 0.54 mg/dL (ref 0.50–1.10)
Creatinine, Ser: 0.75
GFR calc Af Amer: >60
GFR calc Af Amer: >60
GFR calc non Af Amer: >60
GFR calc non Af Amer: >60
GFR calc non Af Amer: >60
Glucose, Bld: 92 mg/dL (ref 70–99)
Glucose, Bld: 87
Potassium: 3.6
Sodium: 135
Sodium: 139

## 2011-01-12 LAB — FUNGUS CULTURE W SMEAR

## 2011-01-12 LAB — COMPREHENSIVE METABOLIC PANEL
Albumin: 3.7
Alkaline Phosphatase: 60
Alkaline Phosphatase: 61
BUN: 15
BUN: 9
Calcium: 8.9
Chloride: 96
Creatinine, Ser: 0.86
Glucose, Bld: 132 — ABNORMAL HIGH
Glucose, Bld: 268 — ABNORMAL HIGH
Potassium: 3.6
Potassium: 3.7
Total Bilirubin: 0.4
Total Protein: 6.6

## 2011-01-12 LAB — P CARINII SMEAR DFA: Pneumocystis carinii DFA: NEGATIVE

## 2011-01-12 LAB — CK TOTAL AND CKMB (NOT AT ARMC)
CK, MB: 1.5
CK, MB: 2.3
Relative Index: INVALID
Relative Index: INVALID
Total CK: 60

## 2011-01-12 LAB — TROPONIN I
Troponin I: 0.01
Troponin I: 0.02
Troponin I: 0.02

## 2011-01-12 LAB — AFB CULTURE WITH SMEAR (NOT AT ARMC): Acid Fast Smear: NONE SEEN

## 2011-01-12 LAB — TSH: TSH: 0.953

## 2011-01-12 LAB — BLOOD GAS, ARTERIAL
Acid-base deficit: 0.9
O2 Content: 2
O2 Saturation: 97
TCO2: 22.7
pO2, Arterial: 103 — ABNORMAL HIGH

## 2011-01-12 LAB — HEPATIC FUNCTION PANEL
Alkaline Phosphatase: 137 U/L — ABNORMAL HIGH (ref 39–117)
Indirect Bilirubin: 0.4 mg/dL (ref 0.3–0.9)
Total Bilirubin: 0.7 mg/dL (ref 0.3–1.2)

## 2011-01-12 LAB — CULTURE, RESPIRATORY W GRAM STAIN

## 2011-01-12 LAB — MAGNESIUM: Magnesium: 2.1

## 2011-01-12 LAB — SEDIMENTATION RATE: Sed Rate: 43 — ABNORMAL HIGH

## 2011-01-12 LAB — RAPID URINE DRUG SCREEN, HOSP PERFORMED
Amphetamines: NOT DETECTED
Barbiturates: NOT DETECTED

## 2011-01-12 LAB — B-NATRIURETIC PEPTIDE (CONVERTED LAB): Pro B Natriuretic peptide (BNP): <30

## 2011-01-13 ENCOUNTER — Inpatient Hospital Stay (HOSPITAL_COMMUNITY): Payer: Medicaid Other

## 2011-01-13 DIAGNOSIS — F1910 Other psychoactive substance abuse, uncomplicated: Secondary | ICD-10-CM

## 2011-01-13 LAB — CBC
Hemoglobin: 8.8 g/dL — ABNORMAL LOW (ref 12.0–15.0)
RBC: 3.08 MIL/uL — ABNORMAL LOW (ref 3.87–5.11)

## 2011-01-13 LAB — PROTIME-INR
INR: 1.1 (ref 0.00–1.49)
Prothrombin Time: 14.4 seconds (ref 11.6–15.2)

## 2011-01-13 LAB — HEPATIC FUNCTION PANEL
Alkaline Phosphatase: 156 U/L — ABNORMAL HIGH (ref 39–117)
Bilirubin, Direct: 0.2 mg/dL (ref 0.0–0.3)
Total Protein: 5.7 g/dL — ABNORMAL LOW (ref 6.0–8.3)

## 2011-01-13 LAB — LACTIC ACID, PLASMA: Lactic Acid, Venous: 0.5 mmol/L (ref 0.5–2.2)

## 2011-01-13 LAB — BASIC METABOLIC PANEL
BUN: 9 mg/dL (ref 6–23)
Creatinine, Ser: 0.63 mg/dL (ref 0.50–1.10)
GFR calc non Af Amer: >60 mL/min (ref >60)
Glucose, Bld: 97 mg/dL (ref 70–99)
Potassium: 2.8 mEq/L — ABNORMAL LOW (ref 3.5–5.1)

## 2011-01-14 DIAGNOSIS — R578 Other shock: Secondary | ICD-10-CM

## 2011-01-14 LAB — CBC
HCT: 28.8 % — ABNORMAL LOW (ref 36.0–46.0)
MCHC: 32.3 g/dL (ref 30.0–36.0)
MCV: 87.8 fL (ref 78.0–100.0)
RDW: 15.1 % (ref 11.5–15.5)

## 2011-01-14 LAB — DIFFERENTIAL
Basophils Absolute: 0 10*3/uL (ref 0.0–0.1)
Eosinophils Absolute: 0.9 10*3/uL — ABNORMAL HIGH (ref 0.0–0.7)
Eosinophils Relative: 11 % — ABNORMAL HIGH (ref 0–5)
Lymphocytes Relative: 28 % (ref 12–46)
Lymphs Abs: 2.3 10*3/uL (ref 0.7–4.0)
Monocytes Absolute: 0.8 10*3/uL (ref 0.1–1.0)

## 2011-01-14 LAB — COMPREHENSIVE METABOLIC PANEL
Albumin: 2.6 g/dL — ABNORMAL LOW (ref 3.5–5.2)
BUN: 9 mg/dL (ref 6–23)
Creatinine, Ser: 0.56 mg/dL (ref 0.50–1.10)
Total Protein: 6.2 g/dL (ref 6.0–8.3)

## 2011-01-15 DIAGNOSIS — J810 Acute pulmonary edema: Secondary | ICD-10-CM

## 2011-01-15 DIAGNOSIS — J9600 Acute respiratory failure, unspecified whether with hypoxia or hypercapnia: Secondary | ICD-10-CM

## 2011-01-15 DIAGNOSIS — F3112 Bipolar disorder, current episode manic without psychotic features, moderate: Secondary | ICD-10-CM

## 2011-01-15 DIAGNOSIS — T405X1A Poisoning by cocaine, accidental (unintentional), initial encounter: Secondary | ICD-10-CM

## 2011-01-15 LAB — COMPREHENSIVE METABOLIC PANEL
ALT: 324 U/L — ABNORMAL HIGH (ref 0–35)
Alkaline Phosphatase: 116 U/L (ref 39–117)
CO2: 31 mEq/L (ref 19–32)
Chloride: 99 mEq/L (ref 96–112)
GFR calc Af Amer: >60 mL/min (ref >60)
GFR calc non Af Amer: >60 mL/min (ref >60)
Glucose, Bld: 105 mg/dL — ABNORMAL HIGH (ref 70–99)
Potassium: 3.3 mEq/L — ABNORMAL LOW (ref 3.5–5.1)
Sodium: 137 mEq/L (ref 135–145)
Total Protein: 6.4 g/dL (ref 6.0–8.3)

## 2011-01-16 ENCOUNTER — Inpatient Hospital Stay (HOSPITAL_COMMUNITY): Payer: Medicaid Other

## 2011-01-16 DIAGNOSIS — J9600 Acute respiratory failure, unspecified whether with hypoxia or hypercapnia: Secondary | ICD-10-CM

## 2011-01-16 DIAGNOSIS — T405X1A Poisoning by cocaine, accidental (unintentional), initial encounter: Secondary | ICD-10-CM

## 2011-01-16 DIAGNOSIS — J810 Acute pulmonary edema: Secondary | ICD-10-CM

## 2011-01-16 LAB — BASIC METABOLIC PANEL
CO2: 28 mEq/L (ref 19–32)
Calcium: 9.5 mg/dL (ref 8.4–10.5)
Creatinine, Ser: 0.6 mg/dL (ref 0.50–1.10)

## 2011-01-16 LAB — CULTURE, BLOOD (ROUTINE X 2)
Culture  Setup Time: 201209190343
Culture: NO GROWTH

## 2011-01-22 LAB — BASIC METABOLIC PANEL
BUN: 4 — ABNORMAL LOW
BUN: 4 — ABNORMAL LOW
BUN: 13
CO2: 25
CO2: 30
Calcium: 8.1 — ABNORMAL LOW
Chloride: 105
Chloride: 106
Chloride: 101
Chloride: 105
Creatinine, Ser: 0.62
Creatinine, Ser: 0.77
GFR calc Af Amer: >60
GFR calc Af Amer: >60
GFR calc Af Amer: >60
GFR calc non Af Amer: >60
Potassium: 3.8
Potassium: 4.3
Potassium: 4.5
Potassium: 4.3
Sodium: 137
Sodium: 140

## 2011-01-22 LAB — DIFFERENTIAL
Eosinophils Absolute: 0.1
Eosinophils Relative: 1
Lymphs Abs: 1.3
Monocytes Absolute: 0.6
Monocytes Relative: 6

## 2011-01-22 LAB — CULTURE, BLOOD (ROUTINE X 2): Culture: NO GROWTH

## 2011-01-22 LAB — CBC
HCT: 30.5 — ABNORMAL LOW
HCT: 31.2 — ABNORMAL LOW
HCT: 33.3 — ABNORMAL LOW
HCT: 31.6 — ABNORMAL LOW
Hemoglobin: 10.3 — ABNORMAL LOW
Hemoglobin: 11 — ABNORMAL LOW
MCHC: 33.8
MCV: 86.5
MCV: 86.6
MCV: 88.2
MCV: 87.8
Platelets: 330
Platelets: 345
RBC: 3.52 — ABNORMAL LOW
RBC: 3.85 — ABNORMAL LOW
RBC: 3.6 — ABNORMAL LOW
WBC: 10.9 — ABNORMAL HIGH
WBC: 6.3
WBC: 8.6
WBC: 8.9

## 2011-01-22 LAB — URINE CULTURE

## 2011-01-22 LAB — POCT I-STAT, CHEM 8
Creatinine, Ser: 1
Hemoglobin: 12.2
Sodium: 136
TCO2: 25

## 2011-01-22 LAB — URINALYSIS, ROUTINE W REFLEX MICROSCOPIC
Bilirubin Urine: NEGATIVE
Ketones, ur: NEGATIVE
Protein, ur: NEGATIVE
Urobilinogen, UA: 0.2

## 2011-01-22 LAB — URINE MICROSCOPIC-ADD ON

## 2011-01-22 LAB — GLUCOSE, CAPILLARY

## 2011-01-22 LAB — TSH: TSH: 1.461

## 2011-01-23 LAB — HEMOGLOBIN AND HEMATOCRIT, BLOOD
HCT: 32.9 — ABNORMAL LOW
Hemoglobin: 10.7 — ABNORMAL LOW

## 2011-01-23 LAB — RAPID URINE DRUG SCREEN, HOSP PERFORMED
Barbiturates: NOT DETECTED
Benzodiazepines: POSITIVE — AB
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
## 2011-01-26 LAB — BASIC METABOLIC PANEL
BUN: 9
Calcium: 9.1
Creatinine, Ser: 0.77
GFR calc non Af Amer: >60
Glucose, Bld: 97
Potassium: 4.1

## 2011-01-26 LAB — CBC
Platelets: 492 — ABNORMAL HIGH
RDW: 16.6 — ABNORMAL HIGH
WBC: 7.3

## 2011-01-26 LAB — URINE CULTURE
Colony Count: NO GROWTH
Culture: NO GROWTH

## 2011-01-26 LAB — DIFFERENTIAL
Basophils Absolute: 0
Lymphocytes Relative: 42
Lymphs Abs: 3.1
Neutro Abs: 3.4
Neutrophils Relative %: 47

## 2011-01-26 LAB — URINALYSIS, ROUTINE W REFLEX MICROSCOPIC
Glucose, UA: NEGATIVE
Hgb urine dipstick: NEGATIVE
Protein, ur: NEGATIVE
pH: 6

## 2011-01-26 LAB — CULTURE, BLOOD (ROUTINE X 2)

## 2011-02-02 LAB — CBC
HCT: 29.5 — ABNORMAL LOW
Hemoglobin: 11.1 — ABNORMAL LOW
Hemoglobin: 9.8 — ABNORMAL LOW
MCHC: 33.5
MCV: 79.9
RBC: 4.15
WBC: 16.3 — ABNORMAL HIGH
WBC: 8.3

## 2011-02-02 LAB — BASIC METABOLIC PANEL
CO2: 24
Calcium: 9.3
Chloride: 106
Creatinine, Ser: 0.85
GFR calc Af Amer: >60
Sodium: 139

## 2011-02-02 LAB — DIFFERENTIAL
Basophils Relative: 1
Lymphs Abs: 2.6
Monocytes Absolute: 0.6
Monocytes Relative: 8
Neutro Abs: 4.8
Neutrophils Relative %: 57

## 2011-02-02 LAB — PREGNANCY, URINE: Preg Test, Ur: NEGATIVE

## 2011-02-19 NOTE — Discharge Summary (Signed)
[name], [name] NO.:  [id number]  MEDICAL RECORD NO.:  [id number]  LOCATION:  1314                         FACILITY:  [facility]  PHYSICIAN:  Nichole Milch, MD      DATE OF BIRTH:  28-Mar-1971  DATE OF ADMISSION:  01/09/2011 DATE OF DISCHARGE:  01/16/2011                              DISCHARGE SUMMARY   FINAL DIAGNOSES:  As follows: 1. Resolved acute respiratory failure secondary to aspiration     pneumonia following overdose. 2. Upper gastrointestinal bleed. 3. Altered sensorium. 4. History of bipolar disease, polysubstance abuse, and Tourette     syndrome. 5. Chronic pain. 6. Cocaine induced cardiac ischemia. 7. Altered liver function tests.  CONSULTANT:  Psychiatry.  PROCEDURES: 1. Endotracheal tube placed on January 10, 2011, and removed on     January 11, 2011. 2. Right internal jugular triple-lumen catheter placed on January 10, 2011, removed on January 16, 2011. 3. Right femoral line, arterial line catheter placed on January 10, 2011, and removed on January 11, 2011.  CURRENT LABORATORY DATA:  January 16, 2011, sodium 137, potassium 3.9, chloride 100, CO2 of 28, glucose 98, BUN 10, creatinine 0.6.  January 13, 2011, alkaline phosphatase 156, AST 264, ALT 797.  This was in comparison to AST, which was 760 and ALT, which was 1329 on January 12, 2011.  Repeat cardiac enzymes, noted troponin of 1.49 with CK-MB of 11.4.  Please note, she had no EKG changes or chest pain with this.  BRIEF HISTORY:  This 40 year old female patient with a history of depression, bipolar disease, Tourette syndrome as well as chronic pain presented on January 09, 2011 after being found unresponsive. Apparently had taken some narcotics from the community, which she obtained without a prescription, also urine culture was positive for cocaine.  The initial course was complicated by what appeared to be an acute aspiration event.  She required  intubation for airway protection and hypoxic respiratory failure.  HOSPITAL COURSE BY DISCHARGE DIAGNOSES: 1. Resolved acute respiratory failure in the setting of presumed     aspiration pneumonia secondary to polysubstance overdose with both     cocaine, and narcotics.  Ms. Barkalow was admitted to the intensive     care.  She developed progressive diffuse pulmonary infiltrates on     films consistent with acute lung injury/adult respiratory distress     syndrome.  She was supported on a mechanical ventilator.     Interventions included low tidal volume with ventilation, empiric     antibiotics, scheduled bronchodilators and sedation.  She improved     over the course of her hospitalization and was eventually     successfully extubated.  At the time of discharge, her chest x-ray     was normalizing.  The pulmonary status is improved, she is off     antibiotics.  She is being discharged to home having resumed normal     pulmonary function, on room air. 2. Cocaine induced cardiac ischemia.  Again, this is secondary to     cocaine ingestion.  She has had no cardiac enzymes.  No further  followup indicated. 3. History of elevated liver function tests.  This has improved over     the course of her hospitalization, and felt this was probably     secondary to alcohol ingestion. 4. History of right upper gastrointestinal bleed.  She did have bloody     emesis on hospital presentation.  This has subsided and it was felt     to be secondary to alcohol ingestion. 5. History of chronic pain, bipolar disease, and Tourette syndrome.     For this, she will resume her normal medications and follow up in     the outpatient setting with her normal psychiatric team. 6. Metabolic acidosis.  This has resolved with volume repletion, organ     hypoperfusion and through aggressive volume resuscitation efforts.  DISCHARGE INSTRUCTIONS:  Diet regular.  DISCHARGE MEDICATIONS: 1. Colace 100 mg 3 times a  day. 2. Diazepam 5 mg 3 times a day. 3. Ferrous sulfate 325 mg every morning. 4. Gabapentin 300 mg twice a day. 5. Nexium 40 mg daily. 6. Orap 1 mg tab, 1/2 tablet at bedtime. 7. Prozac 40 mg daily. 8. Ambien 10 mg q.h.s.  She is instructed to discontinue her ibuprofen in the setting of recent upper GI bleed.  She is instructed to follow up with her regular psychiatrist in the next 1-2 weeks and she is also instructed to follow up with [facility] in 3 to 4 weeks for her general medical care.     Nichole Resides, NP   ______________________________ Nichole Milch, MD    PB/MEDQ  D:  01/16/2011  T:  01/16/2011  Job:  [id number]  Electronically Signed by Nichole Resides NP on 02/13/2011 09:34:57 PM Electronically Signed by Cyril Mourning MD on 02/19/2011 07:08:57 PM

## 2011-04-25 ENCOUNTER — Encounter (HOSPITAL_COMMUNITY): Payer: Self-pay | Admitting: Emergency Medicine

## 2011-04-25 ENCOUNTER — Emergency Department (HOSPITAL_COMMUNITY)
Admission: EM | Admit: 2011-04-25 | Discharge: 2011-04-26 | Disposition: A | Discharge status: Home / Self Care | Payer: Medicaid Other | Source: Home / Self Care | Attending: Emergency Medicine | Admitting: Emergency Medicine

## 2011-04-25 DIAGNOSIS — R339 Retention of urine, unspecified: Secondary | ICD-10-CM

## 2011-04-25 DIAGNOSIS — I2510 Atherosclerotic heart disease of native coronary artery without angina pectoris: Secondary | ICD-10-CM | POA: Insufficient documentation

## 2011-04-25 DIAGNOSIS — N200 Calculus of kidney: Secondary | ICD-10-CM | POA: Insufficient documentation

## 2011-04-25 DIAGNOSIS — E119 Type 2 diabetes mellitus without complications: Secondary | ICD-10-CM | POA: Insufficient documentation

## 2011-04-25 DIAGNOSIS — R05 Cough: Secondary | ICD-10-CM | POA: Insufficient documentation

## 2011-04-25 DIAGNOSIS — R059 Cough, unspecified: Secondary | ICD-10-CM | POA: Insufficient documentation

## 2011-04-25 DIAGNOSIS — R109 Unspecified abdominal pain: Secondary | ICD-10-CM | POA: Insufficient documentation

## 2011-04-25 DIAGNOSIS — J029 Acute pharyngitis, unspecified: Secondary | ICD-10-CM | POA: Insufficient documentation

## 2011-04-25 HISTORY — DX: Chronic obstructive pulmonary disease, unspecified: J44.9

## 2011-04-25 HISTORY — DX: Atherosclerotic heart disease of native coronary artery without angina pectoris: I25.10

## 2011-04-25 NOTE — ED Notes (Signed)
Pt alert, c/o right flank pain, urinary retention, hx of the same, resp even unlabored, skin pwd, denies changes bowel

## 2011-04-26 ENCOUNTER — Emergency Department (HOSPITAL_COMMUNITY): Payer: Medicaid Other

## 2011-04-26 ENCOUNTER — Emergency Department (HOSPITAL_COMMUNITY)
Admission: EM | Admit: 2011-04-26 | Discharge: 2011-04-26 | Disposition: A | Discharge status: Home / Self Care | Payer: Medicaid Other | Attending: Emergency Medicine | Admitting: Emergency Medicine

## 2011-04-26 ENCOUNTER — Encounter (HOSPITAL_COMMUNITY): Payer: Self-pay | Admitting: *Deleted

## 2011-04-26 DIAGNOSIS — Z79899 Other long term (current) drug therapy: Secondary | ICD-10-CM | POA: Insufficient documentation

## 2011-04-26 DIAGNOSIS — R05 Cough: Secondary | ICD-10-CM | POA: Insufficient documentation

## 2011-04-26 DIAGNOSIS — R059 Cough, unspecified: Secondary | ICD-10-CM | POA: Insufficient documentation

## 2011-04-26 DIAGNOSIS — R339 Retention of urine, unspecified: Secondary | ICD-10-CM | POA: Insufficient documentation

## 2011-04-26 DIAGNOSIS — R109 Unspecified abdominal pain: Secondary | ICD-10-CM | POA: Insufficient documentation

## 2011-04-26 DIAGNOSIS — E119 Type 2 diabetes mellitus without complications: Secondary | ICD-10-CM | POA: Insufficient documentation

## 2011-04-26 DIAGNOSIS — J4489 Other specified chronic obstructive pulmonary disease: Secondary | ICD-10-CM | POA: Insufficient documentation

## 2011-04-26 DIAGNOSIS — J449 Chronic obstructive pulmonary disease, unspecified: Secondary | ICD-10-CM | POA: Insufficient documentation

## 2011-04-26 DIAGNOSIS — I2510 Atherosclerotic heart disease of native coronary artery without angina pectoris: Secondary | ICD-10-CM | POA: Insufficient documentation

## 2011-04-26 DIAGNOSIS — Z87442 Personal history of urinary calculi: Secondary | ICD-10-CM | POA: Insufficient documentation

## 2011-04-26 DIAGNOSIS — R51 Headache: Secondary | ICD-10-CM | POA: Insufficient documentation

## 2011-04-26 DIAGNOSIS — J3489 Other specified disorders of nose and nasal sinuses: Secondary | ICD-10-CM | POA: Insufficient documentation

## 2011-04-26 LAB — URINALYSIS, ROUTINE W REFLEX MICROSCOPIC
Bilirubin Urine: NEGATIVE
Hgb urine dipstick: NEGATIVE
Specific Gravity, Urine: 1.018 (ref 1.005–1.030)
pH: 5.5 (ref 5.0–8.0)

## 2011-04-26 LAB — BASIC METABOLIC PANEL
BUN: 16 mg/dL (ref 6–23)
Calcium: 8.7 mg/dL (ref 8.4–10.5)
Creatinine, Ser: 0.74 mg/dL (ref 0.50–1.10)
GFR calc Af Amer: >90 mL/min (ref >90)
GFR calc non Af Amer: >90 mL/min (ref >90)
Potassium: 3.8 mEq/L (ref 3.5–5.1)

## 2011-04-26 LAB — DIFFERENTIAL
Basophils Relative: 1 % (ref 0–1)
Eosinophils Absolute: 0.4 10*3/uL (ref 0.0–0.7)
Eosinophils Absolute: 0.4 10*3/uL (ref 0.0–0.7)
Eosinophils Relative: 6 % — ABNORMAL HIGH (ref 0–5)
Eosinophils Relative: 7 % — ABNORMAL HIGH (ref 0–5)
Lymphs Abs: 2.1 10*3/uL (ref 0.7–4.0)
Monocytes Absolute: 0.5 10*3/uL (ref 0.1–1.0)
Monocytes Relative: 8 % (ref 3–12)
Monocytes Relative: 9 % (ref 3–12)
Neutrophils Relative %: 57 % (ref 43–77)
Neutrophils Relative %: 50 % (ref 43–77)

## 2011-04-26 LAB — CBC
Hemoglobin: 10 g/dL — ABNORMAL LOW (ref 12.0–15.0)
Hemoglobin: 10.7 g/dL — ABNORMAL LOW (ref 12.0–15.0)
MCH: 28.1 pg (ref 26.0–34.0)
MCH: 28.5 pg (ref 26.0–34.0)
MCHC: 32.6 g/dL (ref 30.0–36.0)
MCV: 86.2 fL (ref 78.0–100.0)
MCV: 87.5 fL (ref 78.0–100.0)
RBC: 3.56 MIL/uL — ABNORMAL LOW (ref 3.87–5.11)

## 2011-04-26 LAB — POCT I-STAT, CHEM 8
Creatinine, Ser: 0.9 mg/dL (ref 0.50–1.10)
Glucose, Bld: 85 mg/dL (ref 70–99)
Hemoglobin: 10.9 g/dL — ABNORMAL LOW (ref 12.0–15.0)
Potassium: 3.7 mEq/L (ref 3.5–5.1)

## 2011-04-26 MED ORDER — ONDANSETRON 8 MG PO TBDP
8.0000 mg | ORAL_TABLET | Freq: Once | ORAL | Status: AC
  Administered 2011-04-26: 8 mg via ORAL
  Filled 2011-04-26: qty 1

## 2011-04-26 MED ORDER — NYSTATIN 100000 UNIT/ML MT SUSP: 500000.0000 [IU] | Freq: Four times a day (QID) | OROMUCOSAL | Status: DC

## 2011-04-26 MED ORDER — HYDROMORPHONE HCL PF 1 MG/ML IJ SOLN
1.0000 mg | Freq: Once | INTRAMUSCULAR | Status: AC
  Administered 2011-04-26: 1 mg via INTRAVENOUS
  Filled 2011-04-26: qty 1

## 2011-04-26 MED ORDER — ONDANSETRON HCL 4 MG/2ML IJ SOLN
4.0000 mg | Freq: Once | INTRAMUSCULAR | Status: AC
  Administered 2011-04-26: 4 mg via INTRAVENOUS
  Filled 2011-04-26: qty 2

## 2011-04-26 MED ORDER — HYDROMORPHONE HCL PF 2 MG/ML IJ SOLN: 2.0000 mg | Freq: Once | INTRAMUSCULAR | Status: DC

## 2011-04-26 MED ORDER — SODIUM CHLORIDE 0.9 % IV SOLN
Freq: Once | INTRAVENOUS | Status: AC
  Administered 2011-04-26: 02:00:00 via INTRAVENOUS

## 2011-04-26 MED ORDER — HYDROMORPHONE HCL PF 2 MG/ML IJ SOLN
2.0000 mg | Freq: Once | INTRAMUSCULAR | Status: AC
  Administered 2011-04-26: 2 mg via INTRAVENOUS
  Filled 2011-04-26: qty 1

## 2011-04-26 MED ORDER — LORAZEPAM 2 MG/ML IJ SOLN
2.0000 mg | Freq: Once | INTRAMUSCULAR | Status: AC
  Administered 2011-04-26: 2 mg via INTRAVENOUS
  Filled 2011-04-26: qty 1

## 2011-04-26 MED ORDER — OXYCODONE-ACETAMINOPHEN 5-325 MG PO TABS
2.0000 | ORAL_TABLET | Freq: Once | ORAL | Status: AC
  Administered 2011-04-26: 2 via ORAL
  Filled 2011-04-26: qty 2

## 2011-04-26 MED ORDER — HYDROMORPHONE HCL PF 2 MG/ML IJ SOLN
2.0000 mg | Freq: Once | INTRAMUSCULAR | Status: AC
  Administered 2011-04-26: 2 mg via INTRAMUSCULAR
  Filled 2011-04-26: qty 1

## 2011-04-26 NOTE — ED Provider Notes (Signed)
Medical screening examination/treatment/procedure(s) were performed by non-physician practitioner and as supervising physician I was immediately available for consultation/collaboration.  Jasmine Awe, MD 04/26/11 [phone]

## 2011-04-26 NOTE — ED Provider Notes (Signed)
History     CSN: [id number]  Arrival date & time 04/26/11  1601   First MD Initiated Contact with Patient 04/26/11 2041      Chief Complaint  Patient presents with  . Urinary Retention    (Consider location/radiation/quality/duration/timing/severity/associated sxs/prior treatment) The history is provided by the patient.   41 year old female comes in complaining of abdominal pain radiating around to both flanks. She had been in the emergency department last night and had a CT scan done and a catheter was placed because of urinary retention. She says she feels like she still can't empty her bladder even though the catheter is in place. There is no associated nausea, vomiting, diarrhea and no fever, chills, sweats. Nothing makes her pain better nothing makes it worse. She has had a long history of urinary problems and has an appointment to see the physician's assistant at her urologist's office in about one week. Symptoms are staying the same. Nothing makes it better nothing makes it worse. She also says that every time she has a catheter in or has it taken out she gets an infection and wants to be on some antibiotics.  Past Medical History  Diagnosis Date  . Renal insufficiency   . Asthma   . Coronary artery disease   . COPD (chronic obstructive pulmonary disease)   . Diabetes mellitus     Past Surgical History  Procedure Date  . Abdominal hysterectomy   . Abdominal surgery   . Colon surgery   . Eye surgery     No family history on file.  History  Substance Use Topics  . Smoking status: Current Everyday Smoker -- 0.0 packs/day  . Smokeless tobacco: Not on file  . Alcohol Use: Yes     rarely    OB History    Grav Para Term Preterm Abortions TAB SAB Ect Mult Living                  Review of Systems  All other systems reviewed and are negative.    Allergies  Benadryl allergy; Ciprofloxacin; Depakote; Ketorolac tromethamine; Sumatriptan; Trazodone and nefazodone;  and Valproic acid  Home Medications   Current Outpatient Rx  Name Route Sig Dispense Refill  . DIAZEPAM 2 MG PO TABS Oral Take 2 mg by mouth every 8 (eight) hours as needed.      Marland Kitchen FERROUS FUMARATE 325 (106 FE) MG PO TABS Oral Take 1 tablet by mouth.      . FLUOXETINE HCL 40 MG PO CAPS Oral Take 40 mg by mouth daily.      Marland Kitchen GABAPENTIN 300 MG PO CAPS Oral Take 300 mg by mouth 3 (three) times daily.      Marland Kitchen NAPROXEN SODIUM 220 MG PO CAPS Oral Take 220 mg by mouth.      . NYSTATIN 100000 UNIT/ML MT SUSP Oral Take 5 mLs (500,000 Units total) by mouth 4 (four) times daily. 60 mL 0  . OMEPRAZOLE 20 MG PO CPDR Oral Take 20 mg by mouth daily.      Marland Kitchen PIMOZIDE 1 MG PO TABS Oral Take 0.5 mg by mouth.     . ZOLPIDEM TARTRATE 5 MG PO TABS Oral Take 5 mg by mouth at bedtime as needed.        BP 131/92  Pulse 77  Temp(Src) 98.3 F (36.8 C) (Oral)  Resp 18  Wt 150 lb (68.04 kg)  SpO2 100%  LMP 05/29/2010  Physical Exam  Nursing note and vitals  reviewed.  41 year old female is resting comfortably and in no acute distress. Vital signs are normal. Oxygen saturation is 100% which is normal. Head is normocephalic and atraumatic. PERRLA, EOMI. Oropharynx is clear. Neck is supple without adenopathy. Lungs are clear without rales, wheezes, rhonchi. Back is nontender. Heart is regular rate rhythm without murmur. Abdomen is soft, flat, with mild tenderness across lower abdomen without any localized tenderness. No rebound or guarding. There is no hepatosplenomegaly. Extremities have no cyanosis, or edema, full range of motion present. Skin is warm and moist without rash. Neurologic: Mental status is normal, cranial nerves are intact, there no focal motor or sensory deficits.  ED Course  Procedures (including critical care time)   Labs Reviewed  CBC  DIFFERENTIAL  BASIC METABOLIC PANEL   Ct Abdomen Pelvis Wo Contrast  04/26/2011  *RADIOLOGY REPORT*  Clinical Data: Right flank pain and urinary retention.   History of multiple kidney stones.  CT ABDOMEN AND PELVIS WITHOUT CONTRAST  Technique:  Multidetector CT imaging of the abdomen and pelvis was performed following the standard protocol without intravenous contrast.  Comparison: 12/16/2010  Findings: Focal paraspinal infiltration in the right lung base posteriorly.  This is incompletely included on the study and may represent a small focus of pneumonia.  There is right renal atrophy and scarring.  2 mm nonobstructing intrarenal stone on the right is stable in location since the previous study.  There is no pyelocaliectasis or ureterectasis on either side.  No ureteral stones demonstrated.  No bladder stones. Foley catheter decompresses the bladder.  Unenhanced appearance of the liver, spleen, gallbladder, pancreas, adrenal glands, stomach, abdominal aorta, and retroperitoneal lymph nodes is unremarkable.  The small bowel is decompressed.  Gas and stool throughout the colon without significant distension. No free air or free fluid in the abdomen.  Pelvis:  No free or loculated pelvic fluid collections.  The uterus is surgically absent.  No abnormal adnexal masses.  The appendix is not visualized but no inflammatory changes demonstrated in the right lower quadrant.  No significant pelvic lymphadenopathy. Normal alignment of the lumbar vertebrae.  IMPRESSION: Stable appearance of nonobstructing intrarenal stone, scarring, and atrophy of the right kidney.  No ureteral stones or obstruction. Small focal area of infiltration in the right lung base medially adjacent to the spine.  This may represent inflammatory process such as pneumonia.  Original Report Authenticated By: Marlon Pel, M.D.   Dg Chest 2 View  04/26/2011  *RADIOLOGY REPORT*  Clinical Data: Cough, congestion, difficulty urinating.  CHEST - 2 VIEW  Comparison: 01/16/2011  Findings: The heart size and pulmonary vascularity are normal. The lungs appear clear and expanded without focal air space disease  or consolidation. No blunting of the costophrenic angles.  No pneumothorax.  IMPRESSION: No evidence of active pulmonary disease.  Original Report Authenticated By: Marlon Pel, M.D.     No diagnosis found.  Results for orders placed during the hospital encounter of 04/26/11  CBC      Component Value Range   WBC 6.3  4.0 - 10.5 (K/uL)   RBC 3.75 (*) 3.87 - 5.11 (MIL/uL)   Hemoglobin 10.7 (*) 12.0 - 15.0 (g/dL)   HCT 16.1 (*) 09.6 - 46.0 (%)   MCV 87.5  78.0 - 100.0 (fL)   MCH 28.5  26.0 - 34.0 (pg)   MCHC 32.6  30.0 - 36.0 (g/dL)   RDW 04.5 (*) 40.9 - 15.5 (%)   Platelets 265  150 - 400 (K/uL)  DIFFERENTIAL  Component Value Range   Neutrophils Relative 50  43 - 77 (%)   Neutro Abs 3.1  1.7 - 7.7 (K/uL)   Lymphocytes Relative 35  12 - 46 (%)   Lymphs Abs 2.2  0.7 - 4.0 (K/uL)   Monocytes Relative 9  3 - 12 (%)   Monocytes Absolute 0.5  0.1 - 1.0 (K/uL)   Eosinophils Relative 7 (*) 0 - 5 (%)   Eosinophils Absolute 0.4  0.0 - 0.7 (K/uL)   Basophils Relative 1  0 - 1 (%)   Basophils Absolute 0.0  0.0 - 0.1 (K/uL)  BASIC METABOLIC PANEL      Component Value Range   Sodium 138  135 - 145 (mEq/L)   Potassium 3.8  3.5 - 5.1 (mEq/L)   Chloride 106  96 - 112 (mEq/L)   CO2 24  19 - 32 (mEq/L)   Glucose, Bld 76  70 - 99 (mg/dL)   BUN 16  6 - 23 (mg/dL)   Creatinine, Ser 1.61  0.50 - 1.10 (mg/dL)   Calcium 8.7  8.4 - 09.6 (mg/dL)   GFR calc non Af Amer >90  >90 (mL/min)   GFR calc Af Amer >90  >90 (mL/min)   Ct Abdomen Pelvis Wo Contrast  04/26/2011  *RADIOLOGY REPORT*  Clinical Data: Right flank pain and urinary retention.  History of multiple kidney stones.  CT ABDOMEN AND PELVIS WITHOUT CONTRAST  Technique:  Multidetector CT imaging of the abdomen and pelvis was performed following the standard protocol without intravenous contrast.  Comparison: 12/16/2010  Findings: Focal paraspinal infiltration in the right lung base posteriorly.  This is incompletely included on the  study and may represent a small focus of pneumonia.  There is right renal atrophy and scarring.  2 mm nonobstructing intrarenal stone on the right is stable in location since the previous study.  There is no pyelocaliectasis or ureterectasis on either side.  No ureteral stones demonstrated.  No bladder stones. Foley catheter decompresses the bladder.  Unenhanced appearance of the liver, spleen, gallbladder, pancreas, adrenal glands, stomach, abdominal aorta, and retroperitoneal lymph nodes is unremarkable.  The small bowel is decompressed.  Gas and stool throughout the colon without significant distension. No free air or free fluid in the abdomen.  Pelvis:  No free or loculated pelvic fluid collections.  The uterus is surgically absent.  No abnormal adnexal masses.  The appendix is not visualized but no inflammatory changes demonstrated in the right lower quadrant.  No significant pelvic lymphadenopathy. Normal alignment of the lumbar vertebrae.  IMPRESSION: Stable appearance of nonobstructing intrarenal stone, scarring, and atrophy of the right kidney.  No ureteral stones or obstruction. Small focal area of infiltration in the right lung base medially adjacent to the spine.  This may represent inflammatory process such as pneumonia.  Original Report Authenticated By: Marlon Pel, M.D.   Dg Chest 2 View  04/26/2011  *RADIOLOGY REPORT*  Clinical Data: Cough, congestion, difficulty urinating.  CHEST - 2 VIEW  Comparison: 01/16/2011  Findings: The heart size and pulmonary vascularity are normal. The lungs appear clear and expanded without focal air space disease or consolidation. No blunting of the costophrenic angles.  No pneumothorax.  IMPRESSION: No evidence of active pulmonary disease.  Original Report Authenticated By: Marlon Pel, M.D.    Herlinda talk with the patient explaining that her laboratory workup was normal. At this point, she stated that she had the worst migraine and that she is  scheduled to see  her neurologist in 2 weeks to get Botox injections and once an injection for her migraine. She will be given an injection in the emergency department and sent home.  MDM  Abdominal pain which is unlikely to represent anything serious. I reviewed her ED record from earlier today and CT scan was significant only for diverticulosis without diverticulitis. I will check her and CBC and alert electrolytes and if there is no significant change from this morning, we'll send her home with analgesics for symptomatic treatment until she can see her urologist. She is advised that antibiotics to prevent a urinary infection when the catheter is in place only guarantees that infection will occur but will be resistant to that antibiotic. Therefore, in the absence of clinical infection, no antibiotic will be given today.        Dione Booze, MD 04/26/11 2214

## 2011-04-26 NOTE — ED Notes (Signed)
Pt states "I was here last night until this morning, I have emptied this leg bag x 2 but my stomach is hurting really bad and it hurts into my right kidney, I've had to wear a kidney bag when I was pregnant before, I have had numerous surgeries on my kidneys, reconstructive ureter surgery."

## 2011-04-26 NOTE — ED Notes (Signed)
Physician assistant alerted that no md has signed up for pt care.

## 2011-04-26 NOTE — ED Provider Notes (Signed)
History     CSN: [id number]  Arrival date & time 04/25/11  2244   First MD Initiated Contact with Patient 04/26/11 [phone]      Chief Complaint  Patient presents with  . Urinary Retention    all day.  had dribbled approx 9pm tonight.   . Flank Pain    RT >LT    (Consider location/radiation/quality/duration/timing/severity/associated sxs/prior treatment) HPI Comments: [name] is a Caucasian female with multiple medical problems comes in today with flank pain radiating from the right to a abdomen, stating she's being urinary retention has a history of kidney stands she's had stents placed to assist in urination in the past.  Also complaining of ulcers in her mouth,and  a cough.  Patient is a 41 y.o. female presenting with flank pain. The history is provided by the patient.  Flank Pain This is a recurrent problem. The current episode started yesterday. The problem has been gradually worsening. Associated symptoms include nausea and a sore throat. Pertinent negatives include no chest pain, chills, fever, myalgias or vomiting. The symptoms are aggravated by nothing. She has tried nothing for the symptoms. The treatment provided no relief.    Past Medical History  Diagnosis Date  . Renal insufficiency   . Asthma   . Coronary artery disease   . COPD (chronic obstructive pulmonary disease)   . Diabetes mellitus     Past Surgical History  Procedure Date  . Abdominal hysterectomy   . Abdominal surgery   . Colon surgery   . Eye surgery     No family history on file.  History  Substance Use Topics  . Smoking status: Not on file  . Smokeless tobacco: Not on file  . Alcohol Use:     OB History    Grav Para Term Preterm Abortions TAB SAB Ect Mult Living                  Review of Systems  Constitutional: Negative for fever and chills.  HENT: Positive for sore throat.   Eyes: Negative.   Respiratory: Negative for shortness of breath.   Cardiovascular: Negative for chest  pain and leg swelling.  Gastrointestinal: Positive for nausea. Negative for vomiting.  Genitourinary: Positive for flank pain, decreased urine volume and difficulty urinating. Negative for dysuria and frequency.  Musculoskeletal: Positive for back pain. Negative for myalgias.  Skin: Negative.   Neurological: Negative for dizziness.    Allergies  Benadryl allergy; Ciprofloxacin; Ketorolac tromethamine; Sumatriptan; Trazodone and nefazodone; and Valproic acid  Home Medications   Current Outpatient Rx  Name Route Sig Dispense Refill  . DIAZEPAM 2 MG PO TABS Oral Take 2 mg by mouth every 8 (eight) hours as needed.      Marland Kitchen FERROUS FUMARATE 325 (106 FE) MG PO TABS Oral Take 1 tablet by mouth.      . FLUOXETINE HCL 40 MG PO CAPS Oral Take 40 mg by mouth daily.      Marland Kitchen GABAPENTIN 300 MG PO CAPS Oral Take 300 mg by mouth 3 (three) times daily.      Marland Kitchen OMEPRAZOLE 20 MG PO CPDR Oral Take 20 mg by mouth daily.      Marland Kitchen PIMOZIDE 1 MG PO TABS Oral Take 1 mg by mouth.      . ZOLPIDEM TARTRATE 5 MG PO TABS Oral Take 5 mg by mouth at bedtime as needed.      . NYSTATIN 100000 UNIT/ML MT SUSP Oral Take 5 mLs (500,000 Units  total) by mouth 4 (four) times daily. 60 mL 0    BP 101/73  Pulse 70  Temp(Src) 98.5 F (36.9 C) (Oral)  Resp 18  Wt 150 lb (68.04 kg)  SpO2 100%  LMP 05/29/2010  Physical Exam  Constitutional: She is oriented to person, place, and time. She appears well-developed and well-nourished.  HENT:  Head: Normocephalic.  Eyes: Pupils are equal, round, and reactive to light.  Neck: Normal range of motion. Erythema present.  Cardiovascular: Normal rate.   Pulmonary/Chest: Effort normal.  Abdominal: Soft.  Musculoskeletal: Normal range of motion.  Neurological: She is oriented to person, place, and time.  Skin: Skin is warm and dry.  Psychiatric: She has a normal mood and affect.    ED Course  Procedures (including critical care time)  Labs Reviewed  CBC - Abnormal; Notable for  the following:    RBC 3.56 (*)    Hemoglobin 10.0 (*)    HCT 30.7 (*)    RDW 16.6 (*)    All other components within normal limits  DIFFERENTIAL - Abnormal; Notable for the following:    Eosinophils Relative 6 (*)    All other components within normal limits  POCT I-STAT, CHEM 8 - Abnormal; Notable for the following:    Hemoglobin 10.9 (*)    HCT 32.0 (*)    All other components within normal limits  URINALYSIS, ROUTINE W REFLEX MICROSCOPIC  I-STAT, CHEM 8   Ct Abdomen Pelvis Wo Contrast  04/26/2011  *RADIOLOGY REPORT*  Clinical Data: Right flank pain and urinary retention.  History of multiple kidney stones.  CT ABDOMEN AND PELVIS WITHOUT CONTRAST  Technique:  Multidetector CT imaging of the abdomen and pelvis was performed following the standard protocol without intravenous contrast.  Comparison: 12/16/2010  Findings: Focal paraspinal infiltration in the right lung base posteriorly.  This is incompletely included on the study and may represent a small focus of pneumonia.  There is right renal atrophy and scarring.  2 mm nonobstructing intrarenal stone on the right is stable in location since the previous study.  There is no pyelocaliectasis or ureterectasis on either side.  No ureteral stones demonstrated.  No bladder stones. Foley catheter decompresses the bladder.  Unenhanced appearance of the liver, spleen, gallbladder, pancreas, adrenal glands, stomach, abdominal aorta, and retroperitoneal lymph nodes is unremarkable.  The small bowel is decompressed.  Gas and stool throughout the colon without significant distension. No free air or free fluid in the abdomen.  Pelvis:  No free or loculated pelvic fluid collections.  The uterus is surgically absent.  No abnormal adnexal masses.  The appendix is not visualized but no inflammatory changes demonstrated in the right lower quadrant.  No significant pelvic lymphadenopathy. Normal alignment of the lumbar vertebrae.  IMPRESSION: Stable appearance of  nonobstructing intrarenal stone, scarring, and atrophy of the right kidney.  No ureteral stones or obstruction. Small focal area of infiltration in the right lung base medially adjacent to the spine.  This may represent inflammatory process such as pneumonia.  Original Report Authenticated By: Marlon Pel, M.D.   Dg Chest 2 View  04/26/2011  *RADIOLOGY REPORT*  Clinical Data: Cough, congestion, difficulty urinating.  CHEST - 2 VIEW  Comparison: 01/16/2011  Findings: The heart size and pulmonary vascularity are normal. The lungs appear clear and expanded without focal air space disease or consolidation. No blunting of the costophrenic angles.  No pneumothorax.  IMPRESSION: No evidence of active pulmonary disease.  Original Report Authenticated By: Chrissie Noa  R. STEVENS, M.D.     1. Urinary retention with incomplete bladder emptying       MDM  Retention        Arman Filter, NP 04/26/11 0416  Arman Filter, NP 04/26/11 1610  Arman Filter, NP 04/26/11 [phone]

## 2011-05-01 ENCOUNTER — Encounter (HOSPITAL_COMMUNITY): Payer: Self-pay | Admitting: *Deleted

## 2011-05-01 ENCOUNTER — Emergency Department (HOSPITAL_COMMUNITY)
Admission: EM | Admit: 2011-05-01 | Discharge: 2011-05-01 | Disposition: A | Discharge status: Home Health | Payer: Medicaid Other | Attending: Emergency Medicine | Admitting: Emergency Medicine

## 2011-05-01 DIAGNOSIS — B9689 Other specified bacterial agents as the cause of diseases classified elsewhere: Secondary | ICD-10-CM | POA: Insufficient documentation

## 2011-05-01 DIAGNOSIS — N760 Acute vaginitis: Secondary | ICD-10-CM | POA: Insufficient documentation

## 2011-05-01 DIAGNOSIS — J3489 Other specified disorders of nose and nasal sinuses: Secondary | ICD-10-CM | POA: Insufficient documentation

## 2011-05-01 DIAGNOSIS — J449 Chronic obstructive pulmonary disease, unspecified: Secondary | ICD-10-CM | POA: Insufficient documentation

## 2011-05-01 DIAGNOSIS — A499 Bacterial infection, unspecified: Secondary | ICD-10-CM | POA: Insufficient documentation

## 2011-05-01 DIAGNOSIS — R110 Nausea: Secondary | ICD-10-CM

## 2011-05-01 DIAGNOSIS — J4489 Other specified chronic obstructive pulmonary disease: Secondary | ICD-10-CM | POA: Insufficient documentation

## 2011-05-01 DIAGNOSIS — E119 Type 2 diabetes mellitus without complications: Secondary | ICD-10-CM | POA: Insufficient documentation

## 2011-05-01 DIAGNOSIS — F17200 Nicotine dependence, unspecified, uncomplicated: Secondary | ICD-10-CM | POA: Insufficient documentation

## 2011-05-01 DIAGNOSIS — I2510 Atherosclerotic heart disease of native coronary artery without angina pectoris: Secondary | ICD-10-CM | POA: Insufficient documentation

## 2011-05-01 DIAGNOSIS — R339 Retention of urine, unspecified: Secondary | ICD-10-CM | POA: Insufficient documentation

## 2011-05-01 DIAGNOSIS — Z79899 Other long term (current) drug therapy: Secondary | ICD-10-CM | POA: Insufficient documentation

## 2011-05-01 DIAGNOSIS — B373 Candidiasis of vulva and vagina: Secondary | ICD-10-CM

## 2011-05-01 LAB — URINALYSIS, ROUTINE W REFLEX MICROSCOPIC
Bilirubin Urine: NEGATIVE
Glucose, UA: NEGATIVE mg/dL
Hgb urine dipstick: NEGATIVE
Ketones, ur: NEGATIVE mg/dL
Protein, ur: NEGATIVE mg/dL
Urobilinogen, UA: 1 mg/dL (ref 0.0–1.0)

## 2011-05-01 LAB — URINE MICROSCOPIC-ADD ON

## 2011-05-01 LAB — WET PREP, GENITAL

## 2011-05-01 MED ORDER — ONDANSETRON HCL 4 MG PO TABS: 4.0000 mg | ORAL_TABLET | Freq: Four times a day (QID) | ORAL | Status: AC

## 2011-05-01 MED ORDER — FLUCONAZOLE 150 MG PO TABS
150.0000 mg | ORAL_TABLET | Freq: Once | ORAL | Status: AC
  Administered 2011-05-01: 150 mg via ORAL
  Filled 2011-05-01: qty 1

## 2011-05-01 MED ORDER — METRONIDAZOLE 500 MG PO TABS: 500.0000 mg | ORAL_TABLET | Freq: Two times a day (BID) | ORAL | Status: AC

## 2011-05-01 MED ORDER — OXYCODONE-ACETAMINOPHEN 5-325 MG PO TABS
1.0000 | ORAL_TABLET | Freq: Once | ORAL | Status: AC
  Administered 2011-05-01: 1 via ORAL
  Filled 2011-05-01: qty 1

## 2011-05-01 MED ORDER — ONDANSETRON 8 MG PO TBDP
8.0000 mg | ORAL_TABLET | Freq: Once | ORAL | Status: AC
  Administered 2011-05-01: 8 mg via ORAL
  Filled 2011-05-01: qty 1

## 2011-05-01 MED ORDER — METRONIDAZOLE 500 MG PO TABS
500.0000 mg | ORAL_TABLET | Freq: Once | ORAL | Status: AC
  Administered 2011-05-01: 500 mg via ORAL
  Filled 2011-05-01: qty 1

## 2011-05-01 MED ORDER — HYDROCODONE-ACETAMINOPHEN 5-325 MG PO TABS: 2.0000 | ORAL_TABLET | ORAL | Status: AC | PRN

## 2011-05-01 NOTE — ED Provider Notes (Signed)
History     CSN: [id number]  Arrival date & time 05/01/11  4098   First MD Initiated Contact with Patient 05/01/11 [phone]      Chief Complaint  Patient presents with  . Urinary Tract Infection  . Sinusitis    (Consider location/radiation/quality/duration/timing/severity/associated sxs/prior treatment) HPI  41 year old female with history of urinary retention and renal insufficiency presenting to the ED with chief complaints of lower abdominal pain and bilateral flank pain.  Patient states she was seen in the ED a week ago for complaints of urinary retention. At which time she had an indwelling Foley catheter placed. She has a normal urine specimen on previous visit. Patient thinks she is having urinary tract infection that may have tracted up to the kidney and right greater than left. Patient states pain felt similar to prior UTI. She also noticed vaginal discharge for the past 2 days. She denies vaginal bleeding. Patient has had hysterectomy. Patient is schedule for a followup with the urologist on January 15. However, patient is worried that she may have urinary tract infection that needs treatment. She complains of nausea without vomiting or diarrhea. Patient denies fever.  Patient states she has an upper respiratory infection 2 weeks ago which has resolved. However, she has been having prolonged sinus pain in irritation that is ongoing. She complains of nasal congestions and chest congestion. She denies chest pain shortness of breath.  Past Medical History  Diagnosis Date  . Renal insufficiency   . Asthma   . Coronary artery disease   . COPD (chronic obstructive pulmonary disease)   . Diabetes mellitus     Past Surgical History  Procedure Date  . Abdominal hysterectomy   . Abdominal surgery   . Colon surgery   . Eye surgery     No family history on file.  History  Substance Use Topics  . Smoking status: Current Everyday Smoker -- 0.0 packs/day  . Smokeless tobacco: Not on  file  . Alcohol Use: Yes     rarely    OB History    Grav Para Term Preterm Abortions TAB SAB Ect Mult Living                  Review of Systems  All other systems reviewed and are negative.    Allergies  Benadryl allergy; Ciprofloxacin; Depakote; Ketorolac tromethamine; Sumatriptan; Trazodone and nefazodone; and Valproic acid  Home Medications   Current Outpatient Rx  Name Route Sig Dispense Refill  . DIAZEPAM 2 MG PO TABS Oral Take 2 mg by mouth every 8 (eight) hours as needed.      Marland Kitchen FERROUS FUMARATE 325 (106 FE) MG PO TABS Oral Take 1 tablet by mouth.      . FLUOXETINE HCL 40 MG PO CAPS Oral Take 40 mg by mouth daily.      Marland Kitchen GABAPENTIN 300 MG PO CAPS Oral Take 300 mg by mouth 3 (three) times daily.      Marland Kitchen NAPROXEN SODIUM 220 MG PO CAPS Oral Take 220 mg by mouth.      . NYSTATIN 100000 UNIT/ML MT SUSP Oral Take 5 mLs (500,000 Units total) by mouth 4 (four) times daily. 60 mL 0  . OMEPRAZOLE 20 MG PO CPDR Oral Take 20 mg by mouth daily.      Marland Kitchen PIMOZIDE 1 MG PO TABS Oral Take 0.5 mg by mouth.     . ZOLPIDEM TARTRATE 5 MG PO TABS Oral Take 5 mg by mouth at bedtime as  needed.        BP 127/70  Pulse 86  Temp(Src) 98.1 F (36.7 C) (Oral)  Resp 18  SpO2 100%  LMP 05/29/2010  Physical Exam  Nursing note and vitals reviewed. Constitutional: She appears well-developed and well-nourished. No distress.  HENT:  Head: Normocephalic and atraumatic.  Eyes: Conjunctivae are normal.  Neck: Normal range of motion. Neck supple.  Cardiovascular: Normal rate and regular rhythm.   Pulmonary/Chest: Effort normal and breath sounds normal. She exhibits no tenderness.  Abdominal: Soft. There is tenderness in the suprapubic area. There is CVA tenderness. There is no rigidity, no rebound, no guarding, no tenderness at McBurney's point and negative Murphy's sign.  Genitourinary: There is no rash or lesion on the right labia. There is no rash or lesion on the left labia. Right adnexum  displays no mass and no tenderness. Left adnexum displays no mass and no tenderness. No erythema, tenderness or bleeding around the vagina. Vaginal discharge found.    Lymphadenopathy:       Right: No inguinal adenopathy present.       Left: No inguinal adenopathy present.    ED Course  Procedures (including critical care time)  Labs Reviewed - No data to display No results found.   No diagnosis found.  Results for orders placed during the hospital encounter of 05/01/11  URINALYSIS, ROUTINE W REFLEX MICROSCOPIC      Component Value Range   Color, Urine YELLOW  YELLOW    APPearance CLOUDY (*) CLEAR    Specific Gravity, Urine 1.029  1.005 - 1.030    pH 6.0  5.0 - 8.0    Glucose, UA NEGATIVE  NEGATIVE (mg/dL)   Hgb urine dipstick NEGATIVE  NEGATIVE    Bilirubin Urine NEGATIVE  NEGATIVE    Ketones, ur NEGATIVE  NEGATIVE (mg/dL)   Protein, ur NEGATIVE  NEGATIVE (mg/dL)   Urobilinogen, UA 1.0  0.0 - 1.0 (mg/dL)   Nitrite NEGATIVE  NEGATIVE    Leukocytes, UA TRACE (*) NEGATIVE   WET PREP, GENITAL      Component Value Range   Yeast, Wet Prep FEW (*) NONE SEEN    Trich, Wet Prep NONE SEEN  NONE SEEN    Clue Cells, Wet Prep MODERATE (*) NONE SEEN    WBC, Wet Prep HPF POC MODERATE (*) NONE SEEN   URINE MICROSCOPIC-ADD ON      Component Value Range   Squamous Epithelial / LPF FEW (*) RARE    WBC, UA 3-6  <3 (WBC/hpf)   Bacteria, UA FEW (*) RARE    Crystals CA OXALATE CRYSTALS (*) NEGATIVE    Urine-Other MUCOUS PRESENT     Ct Abdomen Pelvis Wo Contrast  04/26/2011  *RADIOLOGY REPORT*  Clinical Data: Right flank pain and urinary retention.  History of multiple kidney stones.  CT ABDOMEN AND PELVIS WITHOUT CONTRAST  Technique:  Multidetector CT imaging of the abdomen and pelvis was performed following the standard protocol without intravenous contrast.  Comparison: 12/16/2010  Findings: Focal paraspinal infiltration in the right lung base posteriorly.  This is incompletely included on  the study and may represent a small focus of pneumonia.  There is right renal atrophy and scarring.  2 mm nonobstructing intrarenal stone on the right is stable in location since the previous study.  There is no pyelocaliectasis or ureterectasis on either side.  No ureteral stones demonstrated.  No bladder stones. Foley catheter decompresses the bladder.  Unenhanced appearance of the liver, spleen, gallbladder, pancreas, adrenal  glands, stomach, abdominal aorta, and retroperitoneal lymph nodes is unremarkable.  The small bowel is decompressed.  Gas and stool throughout the colon without significant distension. No free air or free fluid in the abdomen.  Pelvis:  No free or loculated pelvic fluid collections.  The uterus is surgically absent.  No abnormal adnexal masses.  The appendix is not visualized but no inflammatory changes demonstrated in the right lower quadrant.  No significant pelvic lymphadenopathy. Normal alignment of the lumbar vertebrae.  IMPRESSION: Stable appearance of nonobstructing intrarenal stone, scarring, and atrophy of the right kidney.  No ureteral stones or obstruction. Small focal area of infiltration in the right lung base medially adjacent to the spine.  This may represent inflammatory process such as pneumonia.  Original Report Authenticated By: Marlon Pel, M.D.   Dg Chest 2 View  04/26/2011  *RADIOLOGY REPORT*  Clinical Data: Cough, congestion, difficulty urinating.  CHEST - 2 VIEW  Comparison: 01/16/2011  Findings: The heart size and pulmonary vascularity are normal. The lungs appear clear and expanded without focal air space disease or consolidation. No blunting of the costophrenic angles.  No pneumothorax.  IMPRESSION: No evidence of active pulmonary disease.  Original Report Authenticated By: Marlon Pel, M.D.       MDM  Pt c/o UTI sxs.  She has an indwelling catheter for urinary retention.  Pt request for catheter to be removed.  She is to f/u with her  urologist in the next few days.  On exam, her abdomen is benign.  Mild tenderness to suprapubic region.  CVA tenderness to R side.    Foley removed.  UA obtain using in and out procedure.  Pelvic exam is unremarkable.  No evidence of cervicitis.  Pt is in NAD, her VSS.  Pt able to urinate without foley today.  The remainder of her examination was unremarkable.  No significant sinus tenderness.  Pt has minimal rhinorrhea, and throat is benign.  Lung's CTAB.  Pt afebrile.    10:40 AM Wet prep shows moderate clue cells, with odor.  Will treat for bacterial vaginosis, and yeast infection.  UA shows no evidence consistent with UTI.  Pt does have calcium oxalate crystals in her urine.  She has hx of nonobstructive kidney stones from prior CT result. With pt following up with her urologist in the next few days, i felt it was appropriate to have pt continue further care with her urologist.  I will not repeat kidney imaging at this time.  I will d/c pain with pain medication and appropriate abx.  Pt voice understanding and agrees with plan.          Fayrene Helper, PA 05/01/11 1101

## 2011-05-01 NOTE — ED Provider Notes (Signed)
Medical screening examination/treatment/procedure(s) were performed by non-physician practitioner and as supervising physician I was immediately available for consultation/collaboration.  Doug Sou, MD 05/01/11 1710

## 2011-05-01 NOTE — ED Notes (Signed)
Pt reports having foley placed here last week for urinary retention. C/o drainage around catheter, burning and pain "from foley to kidney." Also reports sinus pain and pressure for a couple of weeks. C/o nausea as well.

## 2011-05-02 ENCOUNTER — Emergency Department (HOSPITAL_COMMUNITY)
Admission: EM | Admit: 2011-05-02 | Discharge: 2011-05-02 | Disposition: A | Discharge status: Home / Self Care | Payer: Medicaid Other | Attending: Emergency Medicine | Admitting: Emergency Medicine

## 2011-05-02 ENCOUNTER — Encounter (HOSPITAL_COMMUNITY): Payer: Self-pay | Admitting: Emergency Medicine

## 2011-05-02 ENCOUNTER — Emergency Department (HOSPITAL_COMMUNITY): Payer: Medicaid Other

## 2011-05-02 DIAGNOSIS — E119 Type 2 diabetes mellitus without complications: Secondary | ICD-10-CM | POA: Insufficient documentation

## 2011-05-02 DIAGNOSIS — R109 Unspecified abdominal pain: Secondary | ICD-10-CM | POA: Insufficient documentation

## 2011-05-02 DIAGNOSIS — R10819 Abdominal tenderness, unspecified site: Secondary | ICD-10-CM | POA: Insufficient documentation

## 2011-05-02 DIAGNOSIS — Z9889 Other specified postprocedural states: Secondary | ICD-10-CM | POA: Insufficient documentation

## 2011-05-02 DIAGNOSIS — N200 Calculus of kidney: Secondary | ICD-10-CM | POA: Insufficient documentation

## 2011-05-02 DIAGNOSIS — I2510 Atherosclerotic heart disease of native coronary artery without angina pectoris: Secondary | ICD-10-CM | POA: Insufficient documentation

## 2011-05-02 LAB — GC/CHLAMYDIA PROBE AMP, GENITAL
Chlamydia, DNA Probe: NEGATIVE
GC Probe Amp, Genital: NEGATIVE

## 2011-05-02 LAB — URINALYSIS, ROUTINE W REFLEX MICROSCOPIC
Glucose, UA: NEGATIVE mg/dL
Hgb urine dipstick: NEGATIVE
Ketones, ur: NEGATIVE mg/dL
Protein, ur: NEGATIVE mg/dL
pH: 6 (ref 5.0–8.0)

## 2011-05-02 LAB — URINE MICROSCOPIC-ADD ON

## 2011-05-02 LAB — POCT I-STAT, CHEM 8
BUN: 10 mg/dL (ref 6–23)
Chloride: 104 mEq/L (ref 96–112)
Creatinine, Ser: 0.9 mg/dL (ref 0.50–1.10)
Sodium: 140 mEq/L (ref 135–145)
TCO2: 26 mmol/L (ref 0–100)

## 2011-05-02 MED ORDER — HYDROMORPHONE HCL PF 1 MG/ML IJ SOLN
1.0000 mg | Freq: Once | INTRAMUSCULAR | Status: AC
  Administered 2011-05-02: 1 mg via INTRAVENOUS
  Filled 2011-05-02: qty 1

## 2011-05-02 MED ORDER — HYDROMORPHONE HCL PF 1 MG/ML IJ SOLN
1.0000 mg | INTRAMUSCULAR | Status: AC
  Administered 2011-05-02: 1 mg via INTRAVENOUS
  Filled 2011-05-02: qty 1

## 2011-05-02 MED ORDER — PROMETHAZINE HCL 25 MG/ML IJ SOLN
25.0000 mg | Freq: Once | INTRAMUSCULAR | Status: AC
  Administered 2011-05-02: 25 mg via INTRAVENOUS
  Filled 2011-05-02: qty 1

## 2011-05-02 NOTE — ED Provider Notes (Signed)
History     CSN: [id number]  Arrival date & time 05/02/11  0146   None     Chief Complaint  Patient presents with  . Flank Pain    (Consider location/radiation/quality/duration/timing/severity/associated sxs/prior treatment) HPI Comments: Patient is a 41 year old female with a history of obstructive stones status post nephrostomy tube placement, who has had a Foley catheter placed for the last several days due to urinary bladder obstruction. In that time frame she had a CT scan showing no obstructive uropathy, urinalysis without signs of infection or hematuria and was seen in the last 24 hours for bladder spasms and pain at which time she requested her Foley catheter be removed. Since that time she has had a increased pain in her suprapubic area, this radiates to the right flank, and is intermittent, associated with nausea and vomiting. She does have a followup appointment established for January 15 of this year. She has been taking pain medication and Zofran at home with minimal relief.  Patient is a 41 y.o. female presenting with flank pain. The history is provided by the patient and medical records.  Flank Pain    Past Medical History  Diagnosis Date  . Renal insufficiency   . Asthma   . Coronary artery disease   . COPD (chronic obstructive pulmonary disease)   . Diabetes mellitus     Past Surgical History  Procedure Date  . Abdominal hysterectomy   . Abdominal surgery   . Colon surgery   . Eye surgery     No family history on file.  History  Substance Use Topics  . Smoking status: Current Everyday Smoker -- 0.0 packs/day  . Smokeless tobacco: Not on file  . Alcohol Use: Yes     rarely    OB History    Grav Para Term Preterm Abortions TAB SAB Ect Mult Living                  Review of Systems  Genitourinary: Positive for flank pain.  All other systems reviewed and are negative.    Allergies  Benadryl allergy; Ciprofloxacin; Depakote; Ketorolac  tromethamine; Sumatriptan; Trazodone and nefazodone; and Valproic acid  Home Medications   Current Outpatient Rx  Name Route Sig Dispense Refill  . DIAZEPAM 2 MG PO TABS Oral Take 2 mg by mouth every 8 (eight) hours as needed.      Marland Kitchen FERROUS FUMARATE 325 (106 FE) MG PO TABS Oral Take 1 tablet by mouth.      . FLUOXETINE HCL 40 MG PO CAPS Oral Take 40 mg by mouth daily.      Marland Kitchen GABAPENTIN 300 MG PO CAPS Oral Take 300 mg by mouth 3 (three) times daily.      Marland Kitchen HYDROCODONE-ACETAMINOPHEN 5-325 MG PO TABS Oral Take 2 tablets by mouth every 4 (four) hours as needed for pain. 6 tablet 0  . METRONIDAZOLE 500 MG PO TABS Oral Take 1 tablet (500 mg total) by mouth 2 (two) times daily. 14 tablet 0  . NAPROXEN SODIUM 220 MG PO CAPS Oral Take 220 mg by mouth.      . OMEPRAZOLE 20 MG PO CPDR Oral Take 20 mg by mouth daily.      Marland Kitchen ONDANSETRON HCL 4 MG PO TABS Oral Take 1 tablet (4 mg total) by mouth every 6 (six) hours. 12 tablet 0  . PIMOZIDE 1 MG PO TABS Oral Take 0.5 mg by mouth. Pt takes 1/2 tab for dose of 0.5mg     .  ZOLPIDEM TARTRATE 5 MG PO TABS Oral Take 5 mg by mouth at bedtime as needed.        BP 119/70  Pulse 69  Temp(Src) 98.6 F (37 C) (Oral)  Resp 20  Wt 160 lb (72.576 kg)  SpO2 100%  LMP 05/29/2010  Physical Exam  Nursing note and vitals reviewed. Constitutional: She appears well-developed and well-nourished. No distress.  HENT:  Head: Normocephalic and atraumatic.  Mouth/Throat: Oropharynx is clear and moist. No oropharyngeal exudate.  Eyes: Conjunctivae and EOM are normal. Pupils are equal, round, and reactive to light. Right eye exhibits no discharge. Left eye exhibits no discharge. No scleral icterus.  Neck: Normal range of motion. Neck supple. No JVD present. No thyromegaly present.  Cardiovascular: Normal rate, regular rhythm, normal heart sounds and intact distal pulses.  Exam reveals no gallop and no friction rub.   No murmur heard. Pulmonary/Chest: Effort normal and  breath sounds normal. No respiratory distress. She has no wheezes. She has no rales.  Abdominal: Soft. Bowel sounds are normal. She exhibits no distension and no mass. There is tenderness.       Mild suprapubic tenderness, no guarding, no right lower quadrant tenderness, mild CVA tenderness on the right  Musculoskeletal: Normal range of motion. She exhibits no edema and no tenderness.  Lymphadenopathy:    She has no cervical adenopathy.  Neurological: She is alert. Coordination normal.  Skin: Skin is warm and dry. No rash noted. No erythema.  Psychiatric: She has a normal mood and affect. Her behavior is normal.    ED Course  Procedures (including critical care time)  Labs Reviewed  URINALYSIS, ROUTINE W REFLEX MICROSCOPIC - Abnormal; Notable for the following:    Leukocytes, UA TRACE (*)    All other components within normal limits  URINE MICROSCOPIC-ADD ON - Abnormal; Notable for the following:    Squamous Epithelial / LPF MANY (*)    All other components within normal limits  POCT I-STAT, CHEM 8  I-STAT, CHEM 8   US Renal  05/02/2011  *RADIOLOGY REPORT*  Clinical Data: 41 year old female with right flank pain.  Possible obstruction.  History of lithotripsy, nephrostomy and kidney stones.  RENAL/URINARY TRACT ULTRASOUND COMPLETE  Comparison:  CT abdomen pelvis 04/26/2011 and earlier.  Findings:  Right Kidney:  No hydronephrosis.  Renal length atrophy with renal length of 8.9 cm.  2-3 mm upper pole region calculus re-identified.  Left Kidney:  No hydronephrosis.  Renal length 11.3 cm.  Cortical thickness is preserved.  No focal left renal lesion.  Bladder:  Bilateral ureteral jets are documented with color Doppler.  Negative bladder.  IMPRESSION: No obstruction or acute findings.  Atrophied right kidney with 2-3 mm upper pole stone.  Original Report Authenticated By: Ulla Potash III, M.D.     1. Flank pain       MDM  Patient appears uncomfortable, has overall normal vital signs  without fever or hypotension. Will check ultrasound of the kidneys to rule out urinary obstruction, patient declines recurrent CT scan due to radiation. Check kidney function with i-STAT chemistry, urinalysis repeat, pain medication, Zofran.   Lab results show no signs of urinary tract infection, normal chemistry including renal function and an ultrasound that shows no obstruction or acute findings. Findings discussed with patient, she understood she'll be discharged and is to followup with urology.    Vida Roller, MD 05/02/11 [phone]

## 2011-05-02 NOTE — ED Notes (Signed)
Pt alert, presents to ED with cont flank pain, onset several weeks ago, seen several time recently with cont pain and discomfort

## 2011-06-02 ENCOUNTER — Emergency Department (HOSPITAL_COMMUNITY)
Admission: EM | Admit: 2011-06-02 | Discharge: 2011-06-02 | Disposition: A | Discharge status: Home / Self Care | Payer: Medicaid Other | Attending: Emergency Medicine | Admitting: Emergency Medicine

## 2011-06-02 ENCOUNTER — Encounter: Payer: Self-pay | Admitting: Cardiology

## 2011-06-02 ENCOUNTER — Encounter (HOSPITAL_COMMUNITY): Payer: Self-pay | Admitting: Emergency Medicine

## 2011-06-02 ENCOUNTER — Other Ambulatory Visit: Payer: Self-pay

## 2011-06-02 ENCOUNTER — Emergency Department (HOSPITAL_COMMUNITY): Payer: Medicaid Other

## 2011-06-02 DIAGNOSIS — J4489 Other specified chronic obstructive pulmonary disease: Secondary | ICD-10-CM | POA: Insufficient documentation

## 2011-06-02 DIAGNOSIS — Z79899 Other long term (current) drug therapy: Secondary | ICD-10-CM | POA: Insufficient documentation

## 2011-06-02 DIAGNOSIS — R110 Nausea: Secondary | ICD-10-CM | POA: Insufficient documentation

## 2011-06-02 DIAGNOSIS — R071 Chest pain on breathing: Secondary | ICD-10-CM | POA: Insufficient documentation

## 2011-06-02 DIAGNOSIS — I2510 Atherosclerotic heart disease of native coronary artery without angina pectoris: Secondary | ICD-10-CM | POA: Insufficient documentation

## 2011-06-02 DIAGNOSIS — R0602 Shortness of breath: Secondary | ICD-10-CM | POA: Insufficient documentation

## 2011-06-02 DIAGNOSIS — M542 Cervicalgia: Secondary | ICD-10-CM | POA: Insufficient documentation

## 2011-06-02 DIAGNOSIS — E119 Type 2 diabetes mellitus without complications: Secondary | ICD-10-CM | POA: Insufficient documentation

## 2011-06-02 DIAGNOSIS — F17200 Nicotine dependence, unspecified, uncomplicated: Secondary | ICD-10-CM | POA: Insufficient documentation

## 2011-06-02 DIAGNOSIS — J449 Chronic obstructive pulmonary disease, unspecified: Secondary | ICD-10-CM | POA: Insufficient documentation

## 2011-06-02 DIAGNOSIS — R0789 Other chest pain: Secondary | ICD-10-CM

## 2011-06-02 LAB — COMPREHENSIVE METABOLIC PANEL
Albumin: 3.5 g/dL (ref 3.5–5.2)
BUN: 18 mg/dL (ref 6–23)
Calcium: 9.3 mg/dL (ref 8.4–10.5)
Creatinine, Ser: 0.84 mg/dL (ref 0.50–1.10)
Potassium: 4 mEq/L (ref 3.5–5.1)
Total Protein: 6.6 g/dL (ref 6.0–8.3)

## 2011-06-02 LAB — D-DIMER, QUANTITATIVE: D-Dimer, Quant: 0.5 ug/mL-FEU — ABNORMAL HIGH (ref 0.00–0.48)

## 2011-06-02 LAB — URINALYSIS, ROUTINE W REFLEX MICROSCOPIC
Nitrite: NEGATIVE
Specific Gravity, Urine: 1.011 (ref 1.005–1.030)
Urobilinogen, UA: 0.2 mg/dL (ref 0.0–1.0)

## 2011-06-02 LAB — DIFFERENTIAL
Lymphocytes Relative: 29 % (ref 12–46)
Lymphs Abs: 2 10*3/uL (ref 0.7–4.0)
Monocytes Absolute: 0.4 10*3/uL (ref 0.1–1.0)
Monocytes Relative: 6 % (ref 3–12)
Neutro Abs: 4.2 10*3/uL (ref 1.7–7.7)
Neutrophils Relative %: 60 % (ref 43–77)

## 2011-06-02 LAB — RAPID URINE DRUG SCREEN, HOSP PERFORMED
Barbiturates: NOT DETECTED
Cocaine: NOT DETECTED
Opiates: NOT DETECTED

## 2011-06-02 LAB — TROPONIN I: Troponin I: <0.3 ng/mL (ref <0.30)

## 2011-06-02 LAB — CBC
HCT: 32 % — ABNORMAL LOW (ref 36.0–46.0)
Hemoglobin: 10.3 g/dL — ABNORMAL LOW (ref 12.0–15.0)
MCHC: 32.2 g/dL (ref 30.0–36.0)
RBC: 3.6 MIL/uL — ABNORMAL LOW (ref 3.87–5.11)

## 2011-06-02 MED ORDER — OXYCODONE-ACETAMINOPHEN 5-325 MG PO TABS
1.0000 | ORAL_TABLET | Freq: Once | ORAL | Status: AC
  Administered 2011-06-02: 1 via ORAL
  Filled 2011-06-02: qty 1

## 2011-06-02 MED ORDER — OXYCODONE-ACETAMINOPHEN 5-325 MG PO TABS: 1.0000 | ORAL_TABLET | ORAL | Status: AC | PRN

## 2011-06-02 MED ORDER — NITROGLYCERIN 0.4 MG SL SUBL
0.4000 mg | SUBLINGUAL_TABLET | SUBLINGUAL | Status: DC | PRN
  Filled 2011-06-02: qty 25

## 2011-06-02 MED ORDER — ASPIRIN 81 MG PO CHEW
324.0000 mg | CHEWABLE_TABLET | Freq: Once | ORAL | Status: AC
  Administered 2011-06-02: 324 mg via ORAL
  Filled 2011-06-02: qty 4

## 2011-06-02 MED ORDER — ONDANSETRON 8 MG PO TBDP
ORAL_TABLET | ORAL | Status: AC
  Administered 2011-06-02: 8 mg
  Filled 2011-06-02: qty 1

## 2011-06-02 MED ORDER — MORPHINE SULFATE 4 MG/ML IJ SOLN
4.0000 mg | Freq: Once | INTRAMUSCULAR | Status: AC
  Administered 2011-06-02: 4 mg via INTRAVENOUS
  Filled 2011-06-02: qty 1

## 2011-06-02 MED ORDER — IOHEXOL 300 MG/ML  SOLN
100.0000 mL | Freq: Once | INTRAMUSCULAR | Status: AC | PRN
  Administered 2011-06-02: 100 mL via INTRAVENOUS

## 2011-06-02 NOTE — ED Notes (Signed)
IV attempted x 3.  Not able to obtain access.  IV team notified.

## 2011-06-02 NOTE — ED Notes (Signed)
Dr. Preston Fleeting ordered to give patient 1 Nitro SL anyway.

## 2011-06-02 NOTE — ED Notes (Signed)
Nitro SL held for BP of 103/68.  Pt. Reports chest pain she rates as a 10.  MD notified.

## 2011-06-02 NOTE — ED Notes (Signed)
Pt. Continuing to ask for pain medication.  Dr. Preston Fleeting notified.  No orders at this time.

## 2011-06-02 NOTE — ED Provider Notes (Signed)
History     CSN: [id number]  Arrival date & time 06/02/11  1049   First MD Initiated Contact with Patient 06/02/11 1058      Chief Complaint  Patient presents with  . Chest Pain    (Consider location/radiation/quality/duration/timing/severity/associated sxs/prior treatment) The history is provided by the patient.   41 year old female comes in with complaints of left-sided chest pain radiating to her neck and through to her back. She has been having chest pains intermittently ever since she had a heart attack in September. Current pain started this morning and has been worsening. She currently rates the pain at 8/10. Pain is sharp at times and heavy at times. It is worse with certain movements but not with deep breathing. She's had a cold for less oral days with a cough which is mainly nonproductive, but occasionally productive of a small amount of green to brown sputum. She denies fever, but she has had some sweats. She is feeling short of breath and has had nausea without vomiting. She has not taken anything for pain. She has not had any aspirin today.  Past Medical History  Diagnosis Date  . Renal insufficiency   . Asthma   . Coronary artery disease   . COPD (chronic obstructive pulmonary disease)   . Diabetes mellitus     Past Surgical History  Procedure Date  . Abdominal hysterectomy   . Abdominal surgery   . Colon surgery   . Eye surgery     No family history on file.  History  Substance Use Topics  . Smoking status: Current Everyday Smoker -- 0.0 packs/day  . Smokeless tobacco: Not on file  . Alcohol Use: Yes     rarely    OB History    Grav Para Term Preterm Abortions TAB SAB Ect Mult Living                  Review of Systems  All other systems reviewed and are negative.    Allergies  Benadryl allergy; Ciprofloxacin; Depakote; Ketorolac tromethamine; Sumatriptan; Trazodone and nefazodone; and Valproic acid  Home Medications   Current Outpatient Rx    Name Route Sig Dispense Refill  . VITAMIN D 1000 UNITS PO TABS Oral Take 1,000 Units by mouth 2 (two) times daily.    Marland Kitchen DIAZEPAM 2 MG PO TABS Oral Take 2 mg by mouth every 8 (eight) hours as needed. anxiety    . FERROUS FUMARATE 325 (106 FE) MG PO TABS Oral Take 1 tablet by mouth.      . FLUOXETINE HCL 40 MG PO CAPS Oral Take 40 mg by mouth daily.      Marland Kitchen GABAPENTIN 300 MG PO CAPS Oral Take 300 mg by mouth 3 (three) times daily.      Marland Kitchen NAPROXEN SODIUM 220 MG PO CAPS Oral Take 220 mg by mouth.      . OMEPRAZOLE 20 MG PO CPDR Oral Take 20 mg by mouth daily.      Marland Kitchen VITAMIN B-12 100 MCG PO TABS Oral Take 250 mcg by mouth daily.    Marland Kitchen ZOLPIDEM TARTRATE 5 MG PO TABS Oral Take 5 mg by mouth at bedtime as needed.        BP 111/61  Pulse 60  Temp(Src) 98.5 F (36.9 C) (Oral)  Resp 18  SpO2 100%  LMP 05/29/2010  Physical Exam  Nursing note and vitals reviewed.  41 year old female is resting comfortably and in no acute distress. Vital signs are normal. Oxygen  saturation is 100% which is normal. Head is normocephalic and atraumatic. PERRLA, EOMI. Oropharynx is clear. Neck is nontender and supple without adenopathy, or JVD. Back is nontender. Lungs are clear without rales, wheezes, or rhonchi. Heart has regular rate and rhythm without murmur. There is moderate tenderness of the left anterior chest wall. Abdomen is soft, flat, nontender without masses or hepatosplenomegaly. Extremities have no cyanosis or edema, full range of motion is present. Skin is warm and moist without rash. Neurologic: Cranial nerves are intact, there no focal motor or sensory deficits. Psychiatric: Flat affect is evident and she tends to speak in a monotone. She does not appear overtly depressed.  ED Course  Procedures (including critical care time)  Results for orders placed during the hospital encounter of 06/02/11  CBC      Component Value Range   WBC 7.0  4.0 - 10.5 (K/uL)   RBC 3.60 (*) 3.87 - 5.11 (MIL/uL)    Hemoglobin 10.3 (*) 12.0 - 15.0 (g/dL)   HCT 16.1 (*) 09.6 - 46.0 (%)   MCV 88.9  78.0 - 100.0 (fL)   MCH 28.6  26.0 - 34.0 (pg)   MCHC 32.2  30.0 - 36.0 (g/dL)   RDW 04.5 (*) 40.9 - 15.5 (%)   Platelets 281  150 - 400 (K/uL)  DIFFERENTIAL      Component Value Range   Neutrophils Relative 60  43 - 77 (%)   Neutro Abs 4.2  1.7 - 7.7 (K/uL)   Lymphocytes Relative 29  12 - 46 (%)   Lymphs Abs 2.0  0.7 - 4.0 (K/uL)   Monocytes Relative 6  3 - 12 (%)   Monocytes Absolute 0.4  0.1 - 1.0 (K/uL)   Eosinophils Relative 5  0 - 5 (%)   Eosinophils Absolute 0.4  0.0 - 0.7 (K/uL)   Basophils Relative 0  0 - 1 (%)   Basophils Absolute 0.0  0.0 - 0.1 (K/uL)  COMPREHENSIVE METABOLIC PANEL      Component Value Range   Sodium 138  135 - 145 (mEq/L)   Potassium 4.0  3.5 - 5.1 (mEq/L)   Chloride 104  96 - 112 (mEq/L)   CO2 27  19 - 32 (mEq/L)   Glucose, Bld 74  70 - 99 (mg/dL)   BUN 18  6 - 23 (mg/dL)   Creatinine, Ser 8.11  0.50 - 1.10 (mg/dL)   Calcium 9.3  8.4 - 91.4 (mg/dL)   Total Protein 6.6  6.0 - 8.3 (g/dL)   Albumin 3.5  3.5 - 5.2 (g/dL)   AST 15  0 - 37 (U/L)   ALT 10  0 - 35 (U/L)   Alkaline Phosphatase 50  39 - 117 (U/L)   Total Bilirubin 0.1 (*) 0.3 - 1.2 (mg/dL)   GFR calc non Af Amer 86 (*) >90 (mL/min)   GFR calc Af Amer >90  >90 (mL/min)  TROPONIN I      Component Value Range   Troponin I <0.30  <0.30 (ng/mL)  D-DIMER, QUANTITATIVE      Component Value Range   D-Dimer, Quant 0.50 (*) 0.00 - 0.48 (ug/mL-FEU)  URINALYSIS, ROUTINE W REFLEX MICROSCOPIC      Component Value Range   Color, Urine YELLOW  YELLOW    APPearance CLEAR  CLEAR    Specific Gravity, Urine 1.011  1.005 - 1.030    pH 6.5  5.0 - 8.0    Glucose, UA NEGATIVE  NEGATIVE (mg/dL)   Hgb urine  dipstick NEGATIVE  NEGATIVE    Bilirubin Urine NEGATIVE  NEGATIVE    Ketones, ur NEGATIVE  NEGATIVE (mg/dL)   Protein, ur NEGATIVE  NEGATIVE (mg/dL)   Urobilinogen, UA 0.2  0.0 - 1.0 (mg/dL)   Nitrite NEGATIVE   NEGATIVE    Leukocytes, UA NEGATIVE  NEGATIVE   URINE RAPID DRUG SCREEN (HOSP PERFORMED)      Component Value Range   Opiates NONE DETECTED  NONE DETECTED    Cocaine NONE DETECTED  NONE DETECTED    Benzodiazepines POSITIVE (*) NONE DETECTED    Amphetamines NONE DETECTED  NONE DETECTED    Tetrahydrocannabinol NONE DETECTED  NONE DETECTED    Barbiturates NONE DETECTED  NONE DETECTED   POCT PREGNANCY, URINE      Component Value Range   Preg Test, Ur NEGATIVE  NEGATIVE    Dg Chest 2 View  06/02/2011  *RADIOLOGY REPORT*  Clinical Data: Chest pain  CHEST - 2 VIEW  Comparison: 04/26/2011  Findings: Cardiomediastinal silhouette is stable.  No acute infiltrate or pleural effusion.  No pulmonary edema.  Stable mild degenerative changes lower thoracic spine.  IMPRESSION: No active disease.  No significant change.  Original Report Authenticated By: Natasha Mead, M.D.   Ct Angio Chest W/cm &/or Wo Cm  06/02/2011  *RADIOLOGY REPORT*  Clinical Data: Intermittent chest pain, evaluate for PE  CT ANGIOGRAPHY CHEST  Technique:  Multidetector CT imaging of the chest using the standard protocol during bolus administration of intravenous contrast. Multiplanar reconstructed images including MIPs were obtained and reviewed to evaluate the vascular anatomy.  Contrast: OMNIPAQUE IOHEXOL 300 MG/ML IV SOLN  Comparison: Chest radiographs dated 06/02/2011.  CT chest dated 05/29/2010.  Findings: No evidence of pulmonary embolism.  Lungs are essentially clear.  Mild subpleural reticulation in the anterior right upper lobe.  Minimal patchy opacity in the right middle lobe (series 7/image 45). Mild subpleural reticulation/dependent atelectasis at the lung bases.  No suspicious pulmonary nodules.  No pleural effusion or pneumothorax.  Visualized thyroid is unremarkable.  The heart is normal in size.  No pericardial effusion.  No suspicious mediastinal, hilar, or axillary lymphadenopathy.  Visualized upper abdomen is unremarkable.   Mild degenerative changes of the visualized thoracolumbar spine.  IMPRESSION: No evidence of pulmonary embolism.  No evidence of acute cardiopulmonary disease.  Original Report Authenticated By: Charline Bills, M.D.   ECG shows normal sinus rhythm with a rate of 60, no ectopy. Normal axis. Normal P wave. Normal QRS. Normal intervals. Normal ST and T waves. Impression: normal ECG. When compared with ECG of 01/09/2011, nonspecific T wave changes have resolved.  I have reviewed her record of the [place] controlled substance reporting website, and she has had relatively few prescriptions for narcotics. She's given a dose of morphine with little relief of her pain. She's given a dose of Percocet after being reassured that her workup did not show any evidence of any serious pathology. 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  1. Chest wall pain       MDM  Chest pain which is not likely to be cardiac. 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 She also has a history of GI bleed, so cannot take NSAIDs.  Dione Booze, MD 06/02/11 [phone]

## 2011-06-02 NOTE — ED Notes (Signed)
Pt. Started with intermittent chest pain 2 days ago.  Worse today radiating down left arm and neck area.  Pt. Has history of ischemic heart disease and pneumonia which she was hospitalized for in ICU on ventilator.  Recent cough and cold symptoms reported as well.

## 2011-07-19 ENCOUNTER — Encounter: Payer: Self-pay | Admitting: *Deleted

## 2011-07-31 ENCOUNTER — Ambulatory Visit (INDEPENDENT_AMBULATORY_CARE_PROVIDER_SITE_OTHER): Payer: Medicaid Other | Admitting: Cardiology

## 2011-07-31 ENCOUNTER — Encounter: Payer: Self-pay | Admitting: Cardiology

## 2011-07-31 DIAGNOSIS — R079 Chest pain, unspecified: Secondary | ICD-10-CM | POA: Insufficient documentation

## 2011-07-31 NOTE — Patient Instructions (Signed)
The current medical regimen is effective;  continue present plan and medications.  Your physician has requested that you have a stress echocardiogram. For further information please visit [email or web address]. Please follow instruction sheet as given.  Follow up as needed after testing. 

## 2011-07-31 NOTE — Assessment & Plan Note (Signed)
Symptoms somewhat atypical. She does have a history of cocaine use. This can cause accelerated atherosclerosis. Plan stress echocardiogram to exclude ischemia. She also has some dyspnea on exertion and a stress echo will help quantify LV function.

## 2011-07-31 NOTE — Progress Notes (Signed)
HPI: 41 year-old female for evaluation of chest pain. Patient has had intermittent chest pain for quite some time. It is in the left upper chest. It lasts 5 minutes and resolves spontaneously. It is not pleuritic, positional, exertional or related to food. No associated symptoms. She does have some dyspnea on exertion but no orthopnea, PND, pedal edema, palpitations or syncope. Because of the above we will asked to evaluate. Note an echocardiogram in October of 2012 showed normal LV function, mild mitral regurgitation and severely reduced RV function. This occurred in the setting of cocaine use and respiratory failure/aspiration pneumonia.  Current Outpatient Prescriptions  Medication Sig Dispense Refill  . aspirin 81 MG tablet Take 81 mg by mouth daily.      . cholecalciferol (VITAMIN D) 1000 UNITS tablet Take 1,000 Units by mouth 2 (two) times daily.      . diazepam (VALIUM) 2 MG tablet Take 2 mg by mouth every 8 (eight) hours as needed. anxiety      . ferrous fumarate (HEMOCYTE - 106 MG FE) 325 (106 FE) MG TABS Take 1 tablet by mouth.        Marland Kitchen FLUoxetine (PROZAC) 40 MG capsule Take 40 mg by mouth daily.        Marland Kitchen gabapentin (NEURONTIN) 300 MG capsule Take 300 mg by mouth 3 (three) times daily.        . Ibuprofen (ADVIL) 200 MG CAPS Take by mouth as needed.      Marland Kitchen omeprazole (PRILOSEC) 20 MG capsule Take 20 mg by mouth daily.        Marland Kitchen oxyCODONE-acetaminophen (PERCOCET) 5-325 MG per tablet Take 1 tablet by mouth every 4 (four) hours as needed.      . Pseudoephedrine-Acetaminophen (SINUS PAIN/PRESSURE PO) Take 1 tablet by mouth daily.      Marland Kitchen tiZANidine (ZANAFLEX) 2 MG tablet Take 2 mg by mouth 3 (three) times daily.      . vitamin B-12 (CYANOCOBALAMIN) 100 MCG tablet Take 250 mcg by mouth daily.      Marland Kitchen zolpidem (AMBIEN) 5 MG tablet Take 5 mg by mouth at bedtime as needed.          Allergies  Allergen Reactions  . Benadryl Allergy Other (See Comments)  . Ciprofloxacin     REACTION: Hives  .  Depakote Swelling  . Ketorolac Tromethamine Other (See Comments)    Bladder retintion  . Sumatriptan     REACTION: Palpitations  . Trazodone And Nefazodone      Not agree with herTourette's   . Valproic Acid     Past Medical History  Diagnosis Date  . Renal insufficiency   . Asthma   . Coronary artery disease   . COPD (chronic obstructive pulmonary disease)   . Diabetes mellitus   . HEMORRHOIDS, INTERNAL   . ANEMIA OF CHRONIC DISEASE   . Tourette's disorder   . INSOMNIA, CHRONIC   . DEPRESSION   . 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 ACID REFLUX DISEASE   . Irritable bowel syndrome   . MENOPAUSE, SURGICAL   . DEGENERATIVE DISC DISEASE   . BACK PAIN   . Renal colic   . ALCOHOL ABUSE, HX OF   . PERSONAL HX OF METHICILLIN RESIST STAPH AUREUS   . DRUG ABUSE, HX OF   . BLADDER SUSPENSION, HX OF   . CANDIDIASIS   . PNEUMONIA, COMMUNITY ACQUIRED, PNEUMOCOCCAL   . Nephrolithiasis   . PUD (peptic ulcer disease)     Past  Surgical History  Procedure Date  . Abdominal hysterectomy   . Eye surgery   . Bladder tack     History   Social History  . Marital Status: Legally Separated    Spouse Name: N/A    Number of Children: 2  . Years of Education: N/A   Occupational History  .      Unemployed   Social History Main Topics  . Smoking status: Current Everyday Smoker -- 0.0 packs/day  . Smokeless tobacco: Not on file  . Alcohol Use: No     rarely  . Drug Use: No     Previous use of marijuana and cocaine  . Sexually Active: Not on file   Other Topics Concern  . Not on file   Social History Narrative  . No narrative on file    Family History  Problem Relation Age of Onset  . Heart disease      No family history    ROS:  no fevers or chills, productive cough, hemoptysis, dysphasia, odynophagia, melena, hematochezia, dysuria, hematuria, rash, seizure activity, orthopnea, PND, pedal edema, claudication. Remaining systems are negative.  Physical Exam:    Blood pressure 87/64, pulse 76, height 5' (1.524 m), weight 146 lb (66.225 kg), last menstrual period 05/29/2010.  General:  Well developed/well nourished in NAD Skin warm/dry Patient not depressed No peripheral clubbing Back-normal HEENT-normal/normal eyelids Neck supple/normal carotid upstroke bilaterally; no bruits; no JVD; no thyromegaly chest - CTA/ normal expansion CV - RRR/normal S1 and S2; no murmurs, rubs or gallops;  PMI nondisplaced Abdomen -NT/ND, no HSM, no mass, + bowel sounds, no bruit 2+ femoral pulses, no bruits Ext-no edema, chords, 2+ DP Neuro-grossly nonfocal  ECG 06/02/2011 sinus rhythm with no ST changes.

## 2011-08-10 ENCOUNTER — Ambulatory Visit (HOSPITAL_COMMUNITY): Payer: Medicaid Other

## 2011-08-13 ENCOUNTER — Encounter (HOSPITAL_COMMUNITY): Payer: Self-pay | Admitting: *Deleted

## 2011-08-13 ENCOUNTER — Ambulatory Visit (HOSPITAL_COMMUNITY): Payer: Medicaid Other | Attending: Internal Medicine

## 2011-08-13 ENCOUNTER — Encounter: Payer: Self-pay | Admitting: Cardiology

## 2011-08-13 ENCOUNTER — Ambulatory Visit (HOSPITAL_BASED_OUTPATIENT_CLINIC_OR_DEPARTMENT_OTHER): Payer: Medicaid Other

## 2011-08-13 DIAGNOSIS — R079 Chest pain, unspecified: Secondary | ICD-10-CM | POA: Insufficient documentation

## 2011-08-13 DIAGNOSIS — R0989 Other specified symptoms and signs involving the circulatory and respiratory systems: Secondary | ICD-10-CM

## 2011-08-13 DIAGNOSIS — R072 Precordial pain: Secondary | ICD-10-CM

## 2011-08-14 ENCOUNTER — Telehealth: Payer: Self-pay | Admitting: Cardiology

## 2011-08-14 NOTE — Telephone Encounter (Signed)
Spoke with pt, aware of normal stress test results. 

## 2011-08-14 NOTE — Telephone Encounter (Signed)
Fu call °Pt returning your call  °

## 2011-08-21 ENCOUNTER — Emergency Department (HOSPITAL_COMMUNITY)
Admission: EM | Admit: 2011-08-21 | Discharge: 2011-08-21 | Disposition: A | Discharge status: Home / Self Care | Payer: Medicaid Other | Attending: Emergency Medicine | Admitting: Emergency Medicine

## 2011-08-21 ENCOUNTER — Encounter (HOSPITAL_COMMUNITY): Payer: Self-pay | Admitting: Emergency Medicine

## 2011-08-21 DIAGNOSIS — Z79899 Other long term (current) drug therapy: Secondary | ICD-10-CM | POA: Insufficient documentation

## 2011-08-21 DIAGNOSIS — I2510 Atherosclerotic heart disease of native coronary artery without angina pectoris: Secondary | ICD-10-CM | POA: Insufficient documentation

## 2011-08-21 DIAGNOSIS — G4700 Insomnia, unspecified: Secondary | ICD-10-CM | POA: Insufficient documentation

## 2011-08-21 DIAGNOSIS — R51 Headache: Secondary | ICD-10-CM | POA: Insufficient documentation

## 2011-08-21 DIAGNOSIS — F17200 Nicotine dependence, unspecified, uncomplicated: Secondary | ICD-10-CM | POA: Insufficient documentation

## 2011-08-21 DIAGNOSIS — F952 Tourette's disorder: Secondary | ICD-10-CM | POA: Insufficient documentation

## 2011-08-21 DIAGNOSIS — J4489 Other specified chronic obstructive pulmonary disease: Secondary | ICD-10-CM | POA: Insufficient documentation

## 2011-08-21 DIAGNOSIS — K219 Gastro-esophageal reflux disease without esophagitis: Secondary | ICD-10-CM | POA: Insufficient documentation

## 2011-08-21 DIAGNOSIS — M542 Cervicalgia: Secondary | ICD-10-CM | POA: Insufficient documentation

## 2011-08-21 DIAGNOSIS — Z7982 Long term (current) use of aspirin: Secondary | ICD-10-CM | POA: Insufficient documentation

## 2011-08-21 DIAGNOSIS — IMO0002 Reserved for concepts with insufficient information to code with codable children: Secondary | ICD-10-CM | POA: Insufficient documentation

## 2011-08-21 DIAGNOSIS — J449 Chronic obstructive pulmonary disease, unspecified: Secondary | ICD-10-CM | POA: Insufficient documentation

## 2011-08-21 DIAGNOSIS — G8929 Other chronic pain: Secondary | ICD-10-CM | POA: Insufficient documentation

## 2011-08-21 DIAGNOSIS — E119 Type 2 diabetes mellitus without complications: Secondary | ICD-10-CM | POA: Insufficient documentation

## 2011-08-21 MED ORDER — SODIUM CHLORIDE 0.9 % IV BOLUS (SEPSIS)
1000.0000 mL | Freq: Once | INTRAVENOUS | Status: AC
  Administered 2011-08-21: 1000 mL via INTRAVENOUS

## 2011-08-21 MED ORDER — DIAZEPAM 5 MG PO TABS
5.0000 mg | ORAL_TABLET | Freq: Once | ORAL | Status: AC
  Administered 2011-08-21: 5 mg via ORAL
  Filled 2011-08-21: qty 1

## 2011-08-21 MED ORDER — ONDANSETRON 8 MG PO TBDP
8.0000 mg | ORAL_TABLET | Freq: Once | ORAL | Status: AC
  Administered 2011-08-21: 8 mg via ORAL
  Filled 2011-08-21: qty 1

## 2011-08-21 MED ORDER — PROMETHAZINE HCL 25 MG/ML IJ SOLN
25.0000 mg | Freq: Once | INTRAMUSCULAR | Status: AC
  Administered 2011-08-21: 25 mg via INTRAMUSCULAR
  Filled 2011-08-21: qty 1

## 2011-08-21 MED ORDER — OXYCODONE-ACETAMINOPHEN 5-325 MG PO TABS
1.0000 | ORAL_TABLET | Freq: Once | ORAL | Status: AC
  Administered 2011-08-21: 1 via ORAL
  Filled 2011-08-21: qty 1

## 2011-08-21 MED ORDER — HYDROMORPHONE HCL PF 1 MG/ML IJ SOLN
1.0000 mg | Freq: Once | INTRAMUSCULAR | Status: AC
  Administered 2011-08-21: 1 mg via INTRAVENOUS
  Filled 2011-08-21: qty 1

## 2011-08-21 MED ORDER — HYDROMORPHONE HCL PF 1 MG/ML IJ SOLN
1.0000 mg | Freq: Once | INTRAMUSCULAR | Status: AC
  Administered 2011-08-21: 1 mg via INTRAMUSCULAR
  Filled 2011-08-21: qty 1

## 2011-08-21 MED ORDER — METHYLPREDNISOLONE SODIUM SUCC 125 MG IJ SOLR
250.0000 mg | Freq: Once | INTRAMUSCULAR | Status: AC
  Administered 2011-08-21: 250 mg via INTRAVENOUS
  Filled 2011-08-21 (×2): qty 2

## 2011-08-21 MED ORDER — OXYCODONE-ACETAMINOPHEN 5-325 MG PO TABS: 1.0000 | ORAL_TABLET | ORAL | Status: AC | PRN

## 2011-08-21 NOTE — Discharge Instructions (Signed)

## 2011-08-21 NOTE — ED Provider Notes (Addendum)
History     CSN: [id number]  Arrival date & time 08/21/11  7829   First MD Initiated Contact with Patient 08/21/11 [phone]      No chief complaint on file.   (Consider location/radiation/quality/duration/timing/severity/associated sxs/prior treatment) HPI Comments: Patient reports a history of chronic neck pain due to herniated disc.  She states she is currently seeing a pain medicine specialist who has done some injections in the last week.  She notes over the last 4 days she's had worsening neck pain more on the left than the right.  No increased weakness or numbness from baseline.  No fevers.  She notes that she's tried her muscle relaxants at home without good relief.  She believes her neck pain is also causing exacerbations of her chronic migraines for which the patient takes Maxalt and receives Botox injections.  Patient states the Maxalt helps her headaches but then when the neck pain worsens her headaches come back.  Patient is scheduled for continued followup with the pain medicine specialist May 7 with plans for further procedures for her nerve pain.  Patient presents today because of her persistent pain that has kept her from sleeping and has associated symptoms of nausea due to the severity of the pain over the last 4 days.  Patient is a 41 y.o. female presenting with neck injury. The history is provided by the patient. No language interpreter was used.  Neck Injury This is a chronic problem. The current episode started more than 1 week ago. The problem occurs constantly. The problem has not changed since onset.Associated symptoms include headaches. Pertinent negatives include no chest pain, no abdominal pain and no shortness of breath. The symptoms are relieved by nothing. She has tried a cold compress and a warm compress for the symptoms. The treatment provided no relief.    Past Medical History  Diagnosis Date  . Renal insufficiency   . Asthma   . Coronary artery disease   . COPD  (chronic obstructive pulmonary disease)   . Diabetes mellitus   . HEMORRHOIDS, INTERNAL   . ANEMIA OF CHRONIC DISEASE   . Tourette's disorder   . INSOMNIA, CHRONIC   . DEPRESSION   . 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 ACID REFLUX DISEASE   . Irritable bowel syndrome   . MENOPAUSE, SURGICAL   . DEGENERATIVE DISC DISEASE   . BACK PAIN   . Renal colic   . ALCOHOL ABUSE, HX OF   . PERSONAL HX OF METHICILLIN RESIST STAPH AUREUS   . DRUG ABUSE, HX OF   . BLADDER SUSPENSION, HX OF   . CANDIDIASIS   . PNEUMONIA, COMMUNITY ACQUIRED, PNEUMOCOCCAL   . Nephrolithiasis   . PUD (peptic ulcer disease)   . Migraine     Past Surgical History  Procedure Date  . Abdominal hysterectomy   . Eye surgery   . Bladder tack     Family History  Problem Relation Age of Onset  . Heart disease      No family history    History  Substance Use Topics  . Smoking status: Current Everyday Smoker -- 0.0 packs/day  . Smokeless tobacco: Not on file  . Alcohol Use: No     rarely    OB History    Grav Para Term Preterm Abortions TAB SAB Ect Mult Living                  Review of Systems  Constitutional: Negative.  Negative for fever and  chills.  Eyes: Negative.  Negative for discharge and redness.  Respiratory: Negative.  Negative for cough and shortness of breath.   Cardiovascular: Negative.  Negative for chest pain.  Gastrointestinal: Negative.  Negative for nausea, vomiting, abdominal pain and diarrhea.  Genitourinary: Negative.  Negative for dysuria and vaginal discharge.  Musculoskeletal: Negative for back pain.  Skin: Negative.  Negative for color change and rash.  Neurological: Positive for headaches. Negative for syncope.  Hematological: Negative.  Negative for adenopathy.  Psychiatric/Behavioral: Negative.  Negative for confusion.  All other systems reviewed and are negative.    Allergies  Ciprofloxacin; Diphenhydramine hcl; Divalproex sodium; Ketorolac tromethamine;  Sumatriptan; Trazodone and nefazodone; and Valproic acid  Home Medications   Current Outpatient Rx  Name Route Sig Dispense Refill  . ASPIRIN 81 MG PO TABS Oral Take 81 mg by mouth daily.    Marland Kitchen VITAMIN D 1000 UNITS PO TABS Oral Take 1,000 Units by mouth 2 (two) times daily.    Marland Kitchen DIAZEPAM 2 MG PO TABS Oral Take 2 mg by mouth every 8 (eight) hours as needed. anxiety    . FERROUS FUMARATE 325 (106 FE) MG PO TABS Oral Take 1 tablet by mouth.      . FLUOXETINE HCL 40 MG PO CAPS Oral Take 40 mg by mouth daily.      Marland Kitchen GABAPENTIN 300 MG PO CAPS Oral Take 300 mg by mouth 3 (three) times daily.      . IBUPROFEN 200 MG PO CAPS Oral Take by mouth as needed.    Marland Kitchen OMEPRAZOLE 20 MG PO CPDR Oral Take 20 mg by mouth daily.      . OXYCODONE-ACETAMINOPHEN 5-325 MG PO TABS Oral Take 1 tablet by mouth every 4 (four) hours as needed.    Marland Kitchen SINUS PAIN/PRESSURE PO Oral Take 1 tablet by mouth daily.    Marland Kitchen TIZANIDINE HCL 2 MG PO TABS Oral Take 2 mg by mouth 3 (three) times daily.    Marland Kitchen VITAMIN B-12 100 MCG PO TABS Oral Take 250 mcg by mouth daily.    Marland Kitchen ZOLPIDEM TARTRATE 5 MG PO TABS Oral Take 5 mg by mouth at bedtime as needed.        LMP 05/29/2010  Physical Exam  Nursing note and vitals reviewed. Constitutional: She is oriented to person, place, and time. She appears well-developed and well-nourished.  Non-toxic appearance. She does not have a sickly appearance.  HENT:  Head: Normocephalic and atraumatic.  Eyes: Conjunctivae, EOM and lids are normal. Pupils are equal, round, and reactive to light. No scleral icterus.  Neck: Trachea normal and normal range of motion. Neck supple.  Cardiovascular: Normal rate, regular rhythm and normal heart sounds.   Pulmonary/Chest: Effort normal and breath sounds normal. No respiratory distress. She has no wheezes. She has no rales.  Abdominal: Soft. Normal appearance. There is no tenderness. There is no rebound, no guarding and no CVA tenderness.  Musculoskeletal: Normal  range of motion.       Full range of motion motion of patient's neck.  She has 5 out of 5 strength in her deltoids, biceps and triceps.  Palpable radial pulses bilaterally.  Capillary refill is < 2 seconds in her fingertips  Neurological: She is alert and oriented to person, place, and time. She has normal strength.  Skin: Skin is warm, dry and intact. No rash noted.  Psychiatric: She has a normal mood and affect. Her behavior is normal. Judgment and thought content normal.    ED  Course  Procedures (including critical care time)  Labs Reviewed - No data to display No results found.   No diagnosis found.    MDM  Patient with an exacerbation of her chronic neck pain.  It is likely that her chronic neck pain is causing exacerbations of her migraines as well.  Her migraines are her typical migraines surgeon I suspect that this is meningitis or subarachnoid hemorrhage at this time.  I will treat the patient's pain and nausea here and anticipate being able to discharge the patient home with continued followup with the pain management specialists and her primary care physician.  Nat Christen, MD 08/21/11 [phone]  Patient with some improvement in her neck pain but her headache has remained persistent.  Patient has requested Maxalt which we do not have.  I will add on site Medrol and fluids for her headache.  She also complains of persistent nausea so I will give her a dose of Zofran as well.  Nat Christen, MD 08/21/11 1000  Patient is much improved after the repeat doses of pain medications and is now ready for discharge.  Nat Christen, MD 08/21/11 1247

## 2011-08-29 ENCOUNTER — Other Ambulatory Visit (HOSPITAL_COMMUNITY): Payer: Self-pay | Admitting: Urology

## 2011-08-29 DIAGNOSIS — N135 Crossing vessel and stricture of ureter without hydronephrosis: Secondary | ICD-10-CM

## 2011-09-04 ENCOUNTER — Emergency Department (HOSPITAL_COMMUNITY)
Admission: EM | Admit: 2011-09-04 | Discharge: 2011-09-04 | Disposition: A | Discharge status: Home / Self Care | Payer: Medicaid Other | Attending: Emergency Medicine | Admitting: Emergency Medicine

## 2011-09-04 ENCOUNTER — Encounter (HOSPITAL_COMMUNITY): Payer: Self-pay | Admitting: *Deleted

## 2011-09-04 DIAGNOSIS — H02409 Unspecified ptosis of unspecified eyelid: Secondary | ICD-10-CM | POA: Insufficient documentation

## 2011-09-04 DIAGNOSIS — J449 Chronic obstructive pulmonary disease, unspecified: Secondary | ICD-10-CM | POA: Insufficient documentation

## 2011-09-04 DIAGNOSIS — H548 Legal blindness, as defined in USA: Secondary | ICD-10-CM | POA: Insufficient documentation

## 2011-09-04 DIAGNOSIS — F952 Tourette's disorder: Secondary | ICD-10-CM | POA: Insufficient documentation

## 2011-09-04 DIAGNOSIS — F17200 Nicotine dependence, unspecified, uncomplicated: Secondary | ICD-10-CM | POA: Insufficient documentation

## 2011-09-04 DIAGNOSIS — Z79899 Other long term (current) drug therapy: Secondary | ICD-10-CM | POA: Insufficient documentation

## 2011-09-04 DIAGNOSIS — D638 Anemia in other chronic diseases classified elsewhere: Secondary | ICD-10-CM | POA: Insufficient documentation

## 2011-09-04 DIAGNOSIS — R51 Headache: Secondary | ICD-10-CM | POA: Insufficient documentation

## 2011-09-04 DIAGNOSIS — E119 Type 2 diabetes mellitus without complications: Secondary | ICD-10-CM | POA: Insufficient documentation

## 2011-09-04 DIAGNOSIS — J4489 Other specified chronic obstructive pulmonary disease: Secondary | ICD-10-CM | POA: Insufficient documentation

## 2011-09-04 DIAGNOSIS — I2510 Atherosclerotic heart disease of native coronary artery without angina pectoris: Secondary | ICD-10-CM | POA: Insufficient documentation

## 2011-09-04 DIAGNOSIS — K219 Gastro-esophageal reflux disease without esophagitis: Secondary | ICD-10-CM | POA: Insufficient documentation

## 2011-09-04 MED ORDER — DIPHENHYDRAMINE HCL 50 MG/ML IJ SOLN
25.0000 mg | Freq: Once | INTRAMUSCULAR | Status: DC
  Filled 2011-09-04: qty 1

## 2011-09-04 MED ORDER — METOCLOPRAMIDE HCL 5 MG/ML IJ SOLN
10.0000 mg | Freq: Once | INTRAMUSCULAR | Status: AC
  Administered 2011-09-04: 10 mg via INTRAVENOUS
  Filled 2011-09-04: qty 2

## 2011-09-04 MED ORDER — HYDROMORPHONE HCL PF 1 MG/ML IJ SOLN
1.0000 mg | Freq: Once | INTRAMUSCULAR | Status: AC
Start: 2011-09-04 — End: 2011-09-04
  Administered 2011-09-04: 1 mg via INTRAVENOUS
  Filled 2011-09-04: qty 1

## 2011-09-04 MED ORDER — HYDROMORPHONE HCL PF 1 MG/ML IJ SOLN
1.0000 mg | Freq: Once | INTRAMUSCULAR | Status: AC
  Administered 2011-09-04: 1 mg via INTRAVENOUS
  Filled 2011-09-04: qty 1

## 2011-09-04 NOTE — ED Notes (Signed)
Pt has multiple c/o. Pt states she is having constant migraines. Pt states she is also out of pain medication. Pt states she went to the pain clinic and because she has a urinary problems she was unable to give a urine sample they will not fill her pain medications.

## 2011-09-04 NOTE — ED Notes (Signed)
[facility] called for bladder scanner.

## 2011-09-04 NOTE — Discharge Instructions (Signed)

## 2011-09-04 NOTE — ED Provider Notes (Signed)
History     CSN: [id number] Arrival date & time 09/04/11  1719 First MD Initiated Contact with Patient 09/04/11 1833     Chief Complaint  Patient presents with  . Migraine  . Neck Pain   HPI Pt states she had a procedure done at her pain management doctor's office.  She had a procedure done.  Since that time today she started having more pain in her neck and her head.  Pt went to her doctor's office but she was not able to give a urine specimen so they would not adjust her medications.  Pt was told to continue her current medications.  Pt was supposed to follow up with her doctor tomorrow to get her refills.  Pt states her head and neck are hurting her like her migraines.  She needs some medications for pain relief.  Pt has not had any fevers, no falls.   SHe has tried her medications but it is not getting any better.  Pt mentions that she is seeing a specialist for urinary problems.  She is currently on antibiotics for that. Past Medical History  Diagnosis Date  . Renal insufficiency   . Asthma   . Coronary artery disease   . COPD (chronic obstructive pulmonary disease)   . Diabetes mellitus   . HEMORRHOIDS, INTERNAL   . ANEMIA OF CHRONIC DISEASE   . Tourette's disorder   . INSOMNIA, CHRONIC   . DEPRESSION   . 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 ACID REFLUX DISEASE   . Irritable bowel syndrome   . MENOPAUSE, SURGICAL   . DEGENERATIVE DISC DISEASE   . BACK PAIN   . Renal colic   . ALCOHOL ABUSE, HX OF   . PERSONAL HX OF METHICILLIN RESIST STAPH AUREUS   . DRUG ABUSE, HX OF   . BLADDER SUSPENSION, HX OF   . CANDIDIASIS   . PNEUMONIA, COMMUNITY ACQUIRED, PNEUMOCOCCAL   . Nephrolithiasis   . PUD (peptic ulcer disease)   . Migraine     Past Surgical History  Procedure Date  . Abdominal hysterectomy   . Eye surgery   . Bladder tack     Family History  Problem Relation Age of Onset  . Heart disease      No family history    History  Substance Use Topics  .  Smoking status: Current Everyday Smoker -- 0.0 packs/day  . Smokeless tobacco: Not on file  . Alcohol Use: No     rarely    OB History    Grav Para Term Preterm Abortions TAB SAB Ect Mult Living                  Review of Systems  All other systems reviewed and are negative.    Allergies  Ciprofloxacin; Diphenhydramine hcl; Divalproex sodium; Ketorolac tromethamine; Sumatriptan; Trazodone and nefazodone; and Valproic acid  Home Medications   Current Outpatient Rx  Name Route Sig Dispense Refill  . ALBUTEROL SULFATE HFA 108 (90 BASE) MCG/ACT IN AERS Inhalation Inhale 2 puffs into the lungs every 6 (six) hours as needed. For shortness breath    . ASPIRIN-ACETAMINOPHEN-CAFFEINE 520-260-32.5 MG PO PACK Oral Take 1 packet by mouth every 6 (six) hours as needed. For pain    . BACLOFEN 10 MG PO TABS Oral Take 10 mg by mouth every 8 (eight) hours as needed. For muscle spasms    . VITAMIN D 1000 UNITS PO TABS Oral Take 1,000 Units by mouth 2 (two)  times daily.    Marland Kitchen DIAZEPAM 5 MG PO TABS Oral Take 5-10 mg by mouth 2 (two) times daily. Take one tablet in the morning and 2 tablets at bedtime    . FERROUS FUMARATE 325 (106 FE) MG PO TABS Oral Take 1 tablet by mouth.      Marland Kitchen FLUCONAZOLE 150 MG PO TABS Oral Take 150 mg by mouth See admin instructions. Patient was given 2 doses and was told to take at the beginning of the flagyl dose and end of flagyl dose.    Marland Kitchen FLUOXETINE HCL 40 MG PO CAPS Oral Take 40 mg by mouth daily.      Marland Kitchen GABAPENTIN 300 MG PO CAPS Oral Take 300 mg by mouth 3 (three) times daily.      Marland Kitchen LIDOCAINE 5 % EX OINT Topical Apply 1 application topically 2 (two) times daily. Apply to affected area(s) on neck    . METRONIDAZOLE 500 MG PO TABS Oral Take 500 mg by mouth 2 (two) times daily. Take for 7 days    . OXYCODONE-ACETAMINOPHEN 5-325 MG PO TABS Oral Take 1 tablet by mouth every 4 (four) hours as needed. For pain    . PIMOZIDE 2 MG PO TABS Oral Take 1 mg by mouth See admin  instructions. Every other night    . SINUS PAIN/PRESSURE PO Oral Take 1 tablet by mouth every 6 (six) hours as needed. For congestion    . RANITIDINE HCL 150 MG PO TABS Oral Take 150 mg by mouth at bedtime.    Marland Kitchen RIZATRIPTAN BENZOATE 10 MG PO TABS Oral Take 10 mg by mouth as needed. May repeat in 2 hours if needed for migraine    . SULFAMETHOXAZOLE-TMP DS 800-160 MG PO TABS Oral Take 1 tablet by mouth 2 (two) times daily. Take for 7 days.    Marland Kitchen ZOLPIDEM TARTRATE 5 MG PO TABS Oral Take 5 mg by mouth at bedtime as needed. For insomnia      BP 123/90  Pulse 98  Temp 98.4 F (36.9 C)  Resp 20  SpO2 96%  LMP 05/29/2010  Physical Exam  Nursing note and vitals reviewed. Constitutional: She appears well-developed and well-nourished. No distress.  HENT:  Head: Normocephalic and atraumatic.  Right Ear: External ear normal.  Left Ear: External ear normal.  Eyes: Conjunctivae are normal. Right eye exhibits no discharge. Left eye exhibits no discharge. No scleral icterus.       Ptosis left eye  Neck: Neck supple. No tracheal deviation present.  Cardiovascular: Normal rate, regular rhythm and intact distal pulses.   Pulmonary/Chest: Effort normal and breath sounds normal. No stridor. No respiratory distress. She has no wheezes. She has no rales.  Abdominal: Soft. Bowel sounds are normal. She exhibits no distension. There is no tenderness. There is no rebound and no guarding.  Musculoskeletal: She exhibits no edema and no tenderness.  Neurological: She is alert. She has normal strength. No sensory deficit. Cranial nerve deficit:  no gross defecits noted. She exhibits normal muscle tone. She displays no seizure activity. Coordination normal.  Skin: Skin is warm and dry. No rash noted.  Psychiatric: She has a normal mood and affect.    ED Course  Procedures (including critical care time)  Labs Reviewed - No data to display    1. Headache       MDM  Pt improved with treatment in the ED.   Recommended she follow up with her pain management doctor and PCP regarding her  persistent, recurrent headaches.        Celene Kras, MD 09/05/11 [phone]

## 2011-09-04 NOTE — ED Notes (Signed)
Pt has DJD.  Pt reports migraine began after "sauntering of nerves in spine" that was completed today/   Pt reports she has history of migraines.  Pt has tried taking biaxin for pain without relief, but office would not give additional pain medication unless pt would provide a urine sample.  Pt also had a urogram scheduled tomorrow and has yeast, bacterial and UTI,.  Pt was seen by MD Drue Second.  Pt says she feels like her urine won't release and her MD refused to up her pain medications as she could not give a urine sample.  Pt has been using old percocet for pain that she received from the ER.  Pt has migraines treated with Botox and her next appointment is Monday.  Pt reports migraine pain is at front and back of head and associated with nausea. Pt denies vomiting.   Pt also reports being at [facility] last week for an endoscopy.

## 2011-09-05 ENCOUNTER — Encounter (HOSPITAL_COMMUNITY)
Admission: RE | Admit: 2011-09-05 | Discharge: 2011-09-05 | Disposition: A | Discharge status: Home / Self Care | Payer: Medicaid Other | Source: Ambulatory Visit | Attending: Urology | Admitting: Urology

## 2011-09-05 DIAGNOSIS — N135 Crossing vessel and stricture of ureter without hydronephrosis: Secondary | ICD-10-CM

## 2011-09-05 DIAGNOSIS — N270 Small kidney, unilateral: Secondary | ICD-10-CM | POA: Insufficient documentation

## 2011-09-05 MED ORDER — FUROSEMIDE 10 MG/ML IJ SOLN
40.0000 mg | Freq: Once | INTRAMUSCULAR | Status: DC
  Filled 2011-09-05: qty 4

## 2011-09-05 MED ORDER — TECHNETIUM TC 99M MERTIATIDE
15.0000 | Freq: Once | INTRAVENOUS | Status: AC | PRN
  Administered 2011-09-05: 15 via INTRAVENOUS

## 2011-09-25 ENCOUNTER — Emergency Department (HOSPITAL_COMMUNITY)
Admission: EM | Admit: 2011-09-25 | Discharge: 2011-09-25 | Disposition: A | Discharge status: Home / Self Care | Payer: Medicaid Other | Attending: Emergency Medicine | Admitting: Emergency Medicine

## 2011-09-25 ENCOUNTER — Encounter (HOSPITAL_COMMUNITY): Payer: Self-pay | Admitting: *Deleted

## 2011-09-25 DIAGNOSIS — Z79899 Other long term (current) drug therapy: Secondary | ICD-10-CM | POA: Insufficient documentation

## 2011-09-25 DIAGNOSIS — I2510 Atherosclerotic heart disease of native coronary artery without angina pectoris: Secondary | ICD-10-CM | POA: Insufficient documentation

## 2011-09-25 DIAGNOSIS — K589 Irritable bowel syndrome without diarrhea: Secondary | ICD-10-CM | POA: Insufficient documentation

## 2011-09-25 DIAGNOSIS — E119 Type 2 diabetes mellitus without complications: Secondary | ICD-10-CM | POA: Insufficient documentation

## 2011-09-25 DIAGNOSIS — M542 Cervicalgia: Secondary | ICD-10-CM | POA: Insufficient documentation

## 2011-09-25 DIAGNOSIS — K219 Gastro-esophageal reflux disease without esophagitis: Secondary | ICD-10-CM | POA: Insufficient documentation

## 2011-09-25 DIAGNOSIS — G4700 Insomnia, unspecified: Secondary | ICD-10-CM | POA: Insufficient documentation

## 2011-09-25 DIAGNOSIS — IMO0002 Reserved for concepts with insufficient information to code with codable children: Secondary | ICD-10-CM | POA: Insufficient documentation

## 2011-09-25 DIAGNOSIS — J4489 Other specified chronic obstructive pulmonary disease: Secondary | ICD-10-CM | POA: Insufficient documentation

## 2011-09-25 DIAGNOSIS — F17200 Nicotine dependence, unspecified, uncomplicated: Secondary | ICD-10-CM | POA: Insufficient documentation

## 2011-09-25 DIAGNOSIS — G43909 Migraine, unspecified, not intractable, without status migrainosus: Secondary | ICD-10-CM | POA: Insufficient documentation

## 2011-09-25 DIAGNOSIS — J449 Chronic obstructive pulmonary disease, unspecified: Secondary | ICD-10-CM | POA: Insufficient documentation

## 2011-09-25 DIAGNOSIS — H548 Legal blindness, as defined in USA: Secondary | ICD-10-CM | POA: Insufficient documentation

## 2011-09-25 DIAGNOSIS — F952 Tourette's disorder: Secondary | ICD-10-CM | POA: Insufficient documentation

## 2011-09-25 MED ORDER — ONDANSETRON HCL 4 MG/2ML IJ SOLN
4.0000 mg | Freq: Once | INTRAMUSCULAR | Status: AC
  Administered 2011-09-25: 4 mg via INTRAVENOUS
  Filled 2011-09-25: qty 2

## 2011-09-25 MED ORDER — DIAZEPAM 5 MG/ML IJ SOLN
5.0000 mg | Freq: Once | INTRAMUSCULAR | Status: AC
  Administered 2011-09-25: 5 mg via INTRAVENOUS
  Filled 2011-09-25: qty 2

## 2011-09-25 MED ORDER — SODIUM CHLORIDE 0.9 % IV SOLN
Freq: Once | INTRAVENOUS | Status: AC
  Administered 2011-09-25: 14:00:00 via INTRAVENOUS

## 2011-09-25 MED ORDER — HYDROMORPHONE HCL PF 1 MG/ML IJ SOLN
1.0000 mg | Freq: Once | INTRAMUSCULAR | Status: AC
  Administered 2011-09-25: 1 mg via INTRAVENOUS
  Filled 2011-09-25: qty 1

## 2011-09-25 NOTE — ED Provider Notes (Signed)
History     CSN: [id number]  Arrival date & time 09/25/11  1111   First MD Initiated Contact with Patient 09/25/11 1316      Chief Complaint  Patient presents with  . Neck Pain    (Consider location/radiation/quality/duration/timing/severity/associated sxs/prior treatment) Patient is a 41 y.o. female presenting with neck pain.  Neck Pain  This is a chronic problem. Pertinent negatives include no numbness and no weakness.  Pt states she has chronic pain. Has been followed by pain management and has her "nerves burned" in the last 2 wks. States she gets this done every 6 months. States since she had it done, left side of her neck continues to have pain. She has seen the physician that did the procedure, and was told it was spasms. Pt states she has appointment with them again tomorrow, however, pain was not managed at home with her current medications. Pt denies new injuries. Denies weakness in left hand or arm, but states it is "tingling." Denies fever, chills, malaise.   Past Medical History  Diagnosis Date  . Renal insufficiency   . Asthma   . Coronary artery disease   . COPD (chronic obstructive pulmonary disease)   . Diabetes mellitus   . HEMORRHOIDS, INTERNAL   . ANEMIA OF CHRONIC DISEASE   . Tourette's disorder   . INSOMNIA, CHRONIC   . DEPRESSION   . 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 ACID REFLUX DISEASE   . Irritable bowel syndrome   . MENOPAUSE, SURGICAL   . DEGENERATIVE DISC DISEASE   . BACK PAIN   . Renal colic   . ALCOHOL ABUSE, HX OF   . PERSONAL HX OF METHICILLIN RESIST STAPH AUREUS   . DRUG ABUSE, HX OF   . BLADDER SUSPENSION, HX OF   . CANDIDIASIS   . PNEUMONIA, COMMUNITY ACQUIRED, PNEUMOCOCCAL   . Nephrolithiasis   . PUD (peptic ulcer disease)   . Migraine     Past Surgical History  Procedure Date  . Abdominal hysterectomy   . Eye surgery   . Bladder tack     Family History  Problem Relation Age of Onset  . Heart disease      No family  history    History  Substance Use Topics  . Smoking status: Current Everyday Smoker -- 0.0 packs/day  . Smokeless tobacco: Not on file  . Alcohol Use: Yes     occasionally    OB History    Grav Para Term Preterm Abortions TAB SAB Ect Mult Living                  Review of Systems  Constitutional: Negative for fever and chills.  HENT: Positive for neck pain and neck stiffness.   Respiratory: Negative.   Cardiovascular: Negative.   Gastrointestinal: Negative.   Musculoskeletal: Positive for myalgias and arthralgias.  Skin: Negative.   Neurological: Negative for weakness and numbness.    Allergies  Ciprofloxacin; Diphenhydramine hcl; Divalproex sodium; Ketorolac tromethamine; Sumatriptan; Trazodone and nefazodone; and Valproic acid  Home Medications   Current Outpatient Rx  Name Route Sig Dispense Refill  . ALBUTEROL SULFATE HFA 108 (90 BASE) MCG/ACT IN AERS Inhalation Inhale 2 puffs into the lungs every 6 (six) hours as needed. For shortness breath    . ASPIRIN-ACETAMINOPHEN-CAFFEINE 520-260-32.5 MG PO PACK Oral Take 1 packet by mouth every 6 (six) hours as needed. For pain    . BACLOFEN 10 MG PO TABS Oral Take 10 mg by mouth  every 8 (eight) hours as needed. For muscle spasms    . VITAMIN D 1000 UNITS PO TABS Oral Take 1,000 Units by mouth 2 (two) times daily.    Marland Kitchen CLONIDINE HCL 0.1 MG/24HR TD PTWK Transdermal Place 1 patch onto the skin once a week. Apply patch on Sunday    . DIAZEPAM 5 MG PO TABS Oral Take 5-10 mg by mouth 2 (two) times daily. Take one tablet in the morning and 2 tablets at bedtime    . FERROUS FUMARATE 325 (106 FE) MG PO TABS Oral Take 1 tablet by mouth.      . FLUOXETINE HCL 40 MG PO CAPS Oral Take 40 mg by mouth daily.      Marland Kitchen GABAPENTIN 300 MG PO CAPS Oral Take 300 mg by mouth 2 (two) times daily.     . IBUPROFEN 200 MG PO TABS Oral Take 200-600 mg by mouth every 6 (six) hours as needed.    Marland Kitchen LIDOCAINE 5 % EX OINT Topical Apply 1 application topically  2 (two) times daily. Apply to affected area(s) on neck    . LOPERAMIDE HCL 2 MG PO CAPS Oral Take 2 mg by mouth 4 (four) times daily as needed. For diarrhea    . OXYCODONE-ACETAMINOPHEN 5-325 MG PO TABS Oral Take 1 tablet by mouth every 4 (four) hours as needed. For pain    . PIMOZIDE 2 MG PO TABS Oral Take 1 mg by mouth See admin instructions. Every other night    . PROMETHAZINE HCL 25 MG RE SUPP Rectal Place 12.5-25 mg rectally every 8 (eight) hours as needed. For nausea and vomiting    . SINUS PAIN/PRESSURE PO Oral Take 1 tablet by mouth every 6 (six) hours as needed. For congestion    . RANITIDINE HCL 150 MG PO TABS Oral Take 150 mg by mouth at bedtime.    Marland Kitchen RIZATRIPTAN BENZOATE 10 MG PO TABS Oral Take 10 mg by mouth as needed. May repeat in 2 hours if needed for migraine    . ZOLPIDEM TARTRATE 5 MG PO TABS Oral Take 5 mg by mouth at bedtime as needed. For insomnia      BP 97/60  Pulse 59  Temp(Src) 97.9 F (36.6 C) (Oral)  Resp 16  SpO2 100%  LMP 05/29/2010  Physical Exam  Nursing note and vitals reviewed. Constitutional: She appears well-developed and well-nourished. No distress.  HENT:  Head: Normocephalic and atraumatic.  Eyes: Conjunctivae are normal.  Neck: Neck supple.       Tender over left posterior neck. No midline tenderness. Pain with any ROM of the neck. No redness, bruising, swelling. No signs of infection or massess  Cardiovascular: Normal rate, regular rhythm and normal heart sounds.   Pulmonary/Chest: Effort normal and breath sounds normal. No respiratory distress. She has no wheezes.  Neurological: She is alert.       Equal grip strength bilaterally  Skin: Skin is warm and dry.  Psychiatric: She has a normal mood and affect.    ED Course  Procedures (including critical care time)  Pt with chronic neck pain, no new injuries. No signs of infection or masses. She has appointment with her doctor that has done procedure tomorrow. Pt states she just needs some  pain meds to get her pain under contort.   Pt received two doses of dilaudid and valium for her pain. Pain improved. She is neurologically intact.  Pt to follow up tomorrow with her doctor.    1.  Neck pain on left side       MDM         Lottie Mussel, PA 09/25/11 1729

## 2011-09-25 NOTE — ED Notes (Signed)
Attempt x2 by this RN to start IV, unsuccessfully. Diane, RN will attempt 3rd attempt.

## 2011-09-25 NOTE — Discharge Instructions (Signed)
Continue your pain medications. Apply heating pads to the neck several times a day. Follow up with your doctor tomorrow as scheduled.

## 2011-09-25 NOTE — ED Notes (Signed)
Pt reports having nerves sautered in neck a couple of weeks ago for DDD. And botox injections in head for migraines. C/o unbearable pain in neck, radiating down back. +nausea due to pain. Followed by [facility]. Was taking percocet, baclofen, and phenergan at home without relief.

## 2011-09-25 NOTE — ED Notes (Signed)
Pt states that she had some nerves on the left side of neck sautered on 09/11/11 because of DDD. C/o extreme neck pain a 10/10 that radiates down into left arm down to left leg.Reports that neck is stiff and hurts to move. Also reports receiving Botox injections every 3 months for migraines. Reports that she having some nausea.

## 2011-09-26 NOTE — ED Provider Notes (Signed)
Medical screening examination/treatment/procedure(s) were performed by non-physician practitioner and as supervising physician I was immediately available for consultation/collaboration.   Gerhard Munch, MD 09/26/11 [phone]

## 2011-12-10 ENCOUNTER — Emergency Department (HOSPITAL_COMMUNITY)
Admission: EM | Admit: 2011-12-10 | Discharge: 2011-12-10 | Disposition: A | Discharge status: Home / Self Care | Payer: Medicaid Other

## 2011-12-19 ENCOUNTER — Emergency Department (HOSPITAL_COMMUNITY)
Admission: EM | Admit: 2011-12-19 | Discharge: 2011-12-19 | Disposition: A | Discharge status: Home / Self Care | Payer: Medicaid Other | Attending: Emergency Medicine | Admitting: Emergency Medicine

## 2011-12-19 ENCOUNTER — Encounter (HOSPITAL_COMMUNITY): Payer: Self-pay | Admitting: *Deleted

## 2011-12-19 DIAGNOSIS — F17200 Nicotine dependence, unspecified, uncomplicated: Secondary | ICD-10-CM | POA: Insufficient documentation

## 2011-12-19 DIAGNOSIS — B9689 Other specified bacterial agents as the cause of diseases classified elsewhere: Secondary | ICD-10-CM

## 2011-12-19 DIAGNOSIS — IMO0002 Reserved for concepts with insufficient information to code with codable children: Secondary | ICD-10-CM | POA: Insufficient documentation

## 2011-12-19 DIAGNOSIS — A499 Bacterial infection, unspecified: Secondary | ICD-10-CM | POA: Insufficient documentation

## 2011-12-19 DIAGNOSIS — G4700 Insomnia, unspecified: Secondary | ICD-10-CM | POA: Insufficient documentation

## 2011-12-19 DIAGNOSIS — J449 Chronic obstructive pulmonary disease, unspecified: Secondary | ICD-10-CM | POA: Insufficient documentation

## 2011-12-19 DIAGNOSIS — D638 Anemia in other chronic diseases classified elsewhere: Secondary | ICD-10-CM | POA: Insufficient documentation

## 2011-12-19 DIAGNOSIS — G43909 Migraine, unspecified, not intractable, without status migrainosus: Secondary | ICD-10-CM

## 2011-12-19 DIAGNOSIS — E119 Type 2 diabetes mellitus without complications: Secondary | ICD-10-CM | POA: Insufficient documentation

## 2011-12-19 DIAGNOSIS — K219 Gastro-esophageal reflux disease without esophagitis: Secondary | ICD-10-CM | POA: Insufficient documentation

## 2011-12-19 DIAGNOSIS — N760 Acute vaginitis: Secondary | ICD-10-CM | POA: Insufficient documentation

## 2011-12-19 DIAGNOSIS — F952 Tourette's disorder: Secondary | ICD-10-CM | POA: Insufficient documentation

## 2011-12-19 DIAGNOSIS — H548 Legal blindness, as defined in USA: Secondary | ICD-10-CM | POA: Insufficient documentation

## 2011-12-19 DIAGNOSIS — J4489 Other specified chronic obstructive pulmonary disease: Secondary | ICD-10-CM | POA: Insufficient documentation

## 2011-12-19 LAB — URINALYSIS, ROUTINE W REFLEX MICROSCOPIC
Bilirubin Urine: NEGATIVE
Glucose, UA: NEGATIVE mg/dL
Hgb urine dipstick: NEGATIVE
Specific Gravity, Urine: 1.028 (ref 1.005–1.030)
Urobilinogen, UA: 0.2 mg/dL (ref 0.0–1.0)

## 2011-12-19 LAB — WET PREP, GENITAL
Trich, Wet Prep: NONE SEEN
Yeast Wet Prep HPF POC: NONE SEEN

## 2011-12-19 MED ORDER — HYDROMORPHONE HCL PF 2 MG/ML IJ SOLN
2.0000 mg | Freq: Once | INTRAMUSCULAR | Status: AC
  Administered 2011-12-19: 2 mg via INTRAMUSCULAR
  Filled 2011-12-19: qty 1

## 2011-12-19 MED ORDER — PROMETHAZINE HCL 25 MG/ML IJ SOLN
25.0000 mg | INTRAMUSCULAR | Status: AC
  Administered 2011-12-19: 25 mg via INTRAMUSCULAR
  Filled 2011-12-19: qty 1

## 2011-12-19 MED ORDER — METRONIDAZOLE 500 MG PO TABS: 2000.0000 mg | ORAL_TABLET | Freq: Once | ORAL | Status: AC

## 2011-12-19 NOTE — ED Provider Notes (Signed)
History     CSN: [id number] Arrival date & time 12/19/11  4098 First MD Initiated Contact with Patient 12/19/11 [phone]     Chief Complaint  Patient presents with  . Migraine   HPI Pt has had a migraine for 4 days.  She had a botox injection right before this started.  Pt gets these monthly.  She called her neurologist and was prescribed sumatriptan but her insurance has not authorized this medication.  Pt also has chronic neck pain and receives treatment for this.  Pt states the pain has been constant and severe this last 4 days.  Pt came to the ED for pain relief.  Pt is also having vaginal discharge.  She does not have a PCP now she came to the ED.  She has had this off an on for years.    Past Medical History  Diagnosis Date  . Renal insufficiency   . Asthma   . Coronary artery disease   . COPD (chronic obstructive pulmonary disease)   . Diabetes mellitus   . HEMORRHOIDS, INTERNAL   . ANEMIA OF CHRONIC DISEASE   . Tourette's disorder   . INSOMNIA, CHRONIC   . DEPRESSION   . 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 ACID REFLUX DISEASE   . Irritable bowel syndrome   . MENOPAUSE, SURGICAL   . DEGENERATIVE DISC DISEASE   . BACK PAIN   . Renal colic   . ALCOHOL ABUSE, HX OF   . PERSONAL HX OF METHICILLIN RESIST STAPH AUREUS   . DRUG ABUSE, HX OF   . BLADDER SUSPENSION, HX OF   . CANDIDIASIS   . PNEUMONIA, COMMUNITY ACQUIRED, PNEUMOCOCCAL   . Nephrolithiasis   . PUD (peptic ulcer disease)   . Migraine     Past Surgical History  Procedure Date  . Abdominal hysterectomy   . Eye surgery   . Bladder tack     Family History  Problem Relation Age of Onset  . Heart disease      No family history    History  Substance Use Topics  . Smoking status: Current Everyday Smoker -- 0.0 packs/day  . Smokeless tobacco: Not on file  . Alcohol Use: Yes     occasionally    OB History    Grav Para Term Preterm Abortions TAB SAB Ect Mult Living                  Review of  Systems  Allergies  Ciprofloxacin; Diphenhydramine hcl; Divalproex sodium; Ketorolac tromethamine; Trazodone and nefazodone; Valproic acid; and Sumatriptan  Home Medications   Current Outpatient Rx  Name Route Sig Dispense Refill  . ALBUTEROL SULFATE HFA 108 (90 BASE) MCG/ACT IN AERS Inhalation Inhale 2 puffs into the lungs every 6 (six) hours as needed. For shortness breath    . BACLOFEN 10 MG PO TABS Oral Take 10 mg by mouth every 8 (eight) hours as needed. For muscle spasms    . VITAMIN D 1000 UNITS PO TABS Oral Take 1,000 Units by mouth 2 (two) times daily.    Marland Kitchen DIAZEPAM 5 MG PO TABS Oral Take 5-10 mg by mouth 3 (three) times daily. Take one tablet in the morning and 1 tablet at lunch and 2 tablets at bedtime    . ESOMEPRAZOLE MAGNESIUM 40 MG PO CPDR Oral Take 40 mg by mouth 2 (two) times daily.    Marland Kitchen FERROUS FUMARATE 325 (106 FE) MG PO TABS Oral Take 2 tablets by mouth daily.     Marland Kitchen  FLUOXETINE HCL 40 MG PO CAPS Oral Take 40 mg by mouth daily.      Marland Kitchen GABAPENTIN 300 MG PO CAPS Oral Take 300 mg by mouth 2 (two) times daily.     Marland Kitchen LIDOCAINE 5 % EX OINT Topical Apply 1 application topically 2 (two) times daily. Apply to affected area(s) on neck    . LOPERAMIDE HCL 2 MG PO CAPS Oral Take 2 mg by mouth 4 (four) times daily as needed. For diarrhea    . OXYCODONE-ACETAMINOPHEN 5-325 MG PO TABS Oral Take 1 tablet by mouth every 4 (four) hours as needed. For pain    . PIMOZIDE 1 MG PO TABS Oral Take 0.5 mg by mouth at bedtime.    Marland Kitchen PSEUDOEPHEDRINE-ACETAMINOPHEN 30-500 MG PO TABS Oral Take 2 tablets by mouth every 4 (four) hours as needed. Sinus pressure    . RANITIDINE HCL 150 MG PO TABS Oral Take 150 mg by mouth at bedtime.    Marland Kitchen TIZANIDINE HCL 2 MG PO TABS Oral Take 2 mg by mouth every 6 (six) hours as needed. Muscle spasm    . ZOLPIDEM TARTRATE 5 MG PO TABS Oral Take 5 mg by mouth at bedtime as needed. For insomnia      BP 108/71  Pulse 75  Temp 98.5 F (36.9 C) (Oral)  Resp 25  SpO2 98%   LMP 05/29/2010  Physical Exam  Nursing note and vitals reviewed. Constitutional: She appears well-developed and well-nourished. No distress.  HENT:  Head: Normocephalic and atraumatic.  Right Ear: External ear normal.  Left Ear: External ear normal.  Eyes: Conjunctivae are normal. Right eye exhibits no discharge. Left eye exhibits no discharge. No scleral icterus.  Neck: Neck supple. No tracheal deviation present.       Paraspinal tenderness in the cervical spine, no meningismus  Cardiovascular: Normal rate, regular rhythm and intact distal pulses.   Pulmonary/Chest: Effort normal and breath sounds normal. No stridor. No respiratory distress. She has no wheezes. She has no rales.  Abdominal: Soft. Bowel sounds are normal. She exhibits no distension. There is no tenderness. There is no rebound and no guarding.  Genitourinary: Uterus normal. Pelvic exam was performed with patient supine. There is no rash, tenderness or lesion on the right labia. There is no rash, tenderness or lesion on the left labia. Cervix exhibits no motion tenderness and no discharge. Right adnexum displays no mass, no tenderness and no fullness. Left adnexum displays no mass, no tenderness and no fullness. Vaginal discharge found.  Musculoskeletal: She exhibits no edema and no tenderness.  Neurological: She is alert. She has normal strength. No sensory deficit. Cranial nerve deficit:  no gross defecits noted. She exhibits normal muscle tone. She displays no seizure activity. Coordination normal.  Skin: Skin is warm and dry. No rash noted.  Psychiatric: She has a normal mood and affect.    ED Course  Procedures (including critical care time)  Labs Reviewed  WET PREP, GENITAL - Abnormal; Notable for the following:    Clue Cells Wet Prep HPF POC FEW (*)     WBC, Wet Prep HPF POC FEW (*)     All other components within normal limits  URINALYSIS, ROUTINE W REFLEX MICROSCOPIC  GC/CHLAMYDIA PROBE AMP, GENITAL  RPR   No  results found.   1. Migraine headache   2. Bacterial vaginosis       MDM  Patient has a history of recurrent headaches that have brought her to the emergency room for  treatment. She has a headache specialist that she sees. This headache appears to be consistent with that. I doubt subarachnoid hemorrhage, meningitis or other severe etiology for headache. Regarding her recurrent vaginal discharge, she does appear to have bacterial vaginosis. I'll prescribe her Flagyl and have her followup with her primary care Dr.        Celene Kras, MD 12/19/11 1034

## 2011-12-19 NOTE — ED Notes (Signed)
Pt reports migraine h/a x 4 days-has hx of this.  Pt reports having botox done 4 days prior to migraine.  Pt also reports having vaginal discharge "that smells funny and itchy."

## 2011-12-19 NOTE — ED Notes (Signed)
Pt alert and oriented x4, respiration even and unlabored. Skin warm and dry. In no acute distress. Denies needs.

## 2011-12-19 NOTE — ED Notes (Signed)
Pt alert and oriented x4. Respirations even and unlabored, bilateral symmetrical rise and fall of chest. Skin warm and dry. In no acute distress. Denies needs.   

## 2011-12-19 NOTE — ED Notes (Signed)
rn checked with md. No IV required at this time

## 2011-12-19 NOTE — ED Notes (Signed)
Patient unable to provide urine sample at this time

## 2011-12-21 ENCOUNTER — Ambulatory Visit: Payer: Medicaid Other | Admitting: Physical Therapy

## 2011-12-21 LAB — GC/CHLAMYDIA PROBE AMP, GENITAL
Chlamydia, DNA Probe: NEGATIVE
GC Probe Amp, Genital: NEGATIVE

## 2012-02-01 ENCOUNTER — Ambulatory Visit: Payer: Medicaid Other | Admitting: Physical Therapy

## 2012-02-04 ENCOUNTER — Ambulatory Visit: Payer: Medicaid Other | Admitting: Physical Therapy

## 2012-02-11 ENCOUNTER — Ambulatory Visit: Payer: Medicaid Other | Admitting: Physical Therapy

## 2012-04-07 ENCOUNTER — Other Ambulatory Visit: Payer: Self-pay | Admitting: Radiology

## 2012-04-07 NOTE — Telephone Encounter (Signed)
Please pull chart.

## 2012-04-07 NOTE — Telephone Encounter (Signed)
Have gotten fax from [facility]ium / please advise. Pended rx

## 2012-04-08 NOTE — Telephone Encounter (Signed)
No paper chart °

## 2012-12-10 ENCOUNTER — Other Ambulatory Visit: Payer: Self-pay | Admitting: Neurology

## 2012-12-21 ENCOUNTER — Telehealth: Payer: Self-pay | Admitting: Neurology

## 2012-12-21 MED ORDER — TRAMADOL HCL 50 MG PO TABS
50.0000 mg | ORAL_TABLET | Freq: Four times a day (QID) | ORAL | Status: DC | PRN
Start: 2012-12-21 — End: 2013-01-23

## 2012-12-21 MED ORDER — METHOCARBAMOL 500 MG PO TABS: 500.0000 mg | ORAL_TABLET | Freq: Three times a day (TID) | ORAL | Status: DC

## 2012-12-21 NOTE — Telephone Encounter (Signed)
The patient called. She has had a headache for 5 days. She has heme positive stools, and she cannot get steroids. I will call in robaxin for cervocgenic headache, and  I will call in ultram. She has not been seen in over one year and she will need a RV.

## 2012-12-22 ENCOUNTER — Other Ambulatory Visit: Payer: Self-pay | Admitting: Family Medicine

## 2012-12-25 ENCOUNTER — Other Ambulatory Visit: Payer: Self-pay | Admitting: Radiology

## 2012-12-25 NOTE — Telephone Encounter (Signed)
Patient requesting meds from Dr Clelia Croft, she will need to establish here first or she will need to establish with the new clinic which took the place of [facility] ? [facility]

## 2013-01-09 ENCOUNTER — Other Ambulatory Visit: Payer: Self-pay | Admitting: Neurology

## 2013-01-20 ENCOUNTER — Encounter: Payer: Self-pay | Admitting: Neurology

## 2013-01-23 ENCOUNTER — Ambulatory Visit (INDEPENDENT_AMBULATORY_CARE_PROVIDER_SITE_OTHER): Payer: Medicaid Other | Admitting: Neurology

## 2013-01-23 ENCOUNTER — Encounter: Payer: Self-pay | Admitting: Neurology

## 2013-01-23 VITALS — BP 115/77 | HR 63 | Ht 61.0 in | Wt 153.0 lb

## 2013-01-23 DIAGNOSIS — G43909 Migraine, unspecified, not intractable, without status migrainosus: Secondary | ICD-10-CM

## 2013-01-23 DIAGNOSIS — G4730 Sleep apnea, unspecified: Secondary | ICD-10-CM

## 2013-01-23 DIAGNOSIS — F329 Major depressive disorder, single episode, unspecified: Secondary | ICD-10-CM

## 2013-01-23 DIAGNOSIS — J13 Pneumonia due to Streptococcus pneumoniae: Secondary | ICD-10-CM

## 2013-01-23 DIAGNOSIS — F1021 Alcohol dependence, in remission: Secondary | ICD-10-CM

## 2013-01-23 MED ORDER — NORTRIPTYLINE HCL 25 MG PO CAPS: 50.0000 mg | ORAL_CAPSULE | Freq: Every day | ORAL | Status: DC

## 2013-01-23 MED ORDER — RIZATRIPTAN BENZOATE 10 MG PO TABS: 10.0000 mg | ORAL_TABLET | ORAL | Status: DC | PRN

## 2013-01-23 MED ORDER — SUMATRIPTAN SUCCINATE REFILL 6 MG/0.5ML ~~LOC~~ SOCT: 6.0000 mg | SUBCUTANEOUS | Status: DC | PRN

## 2013-01-23 NOTE — Progress Notes (Signed)
History of Present Illness: [name] is a 42 year old right-handed Caucasian female, accompanied by her husband to follow up for migraine.  She was patient of Dr. Sandria Manly, I saw her previously for Botox injection as migraine prevention, last office visit was August 2013  She has past medical history of peptic ulcer, kidney stone, bipolar, history of polysubstance abuse,  She presented with chronic migraine headaches since age 32, progressively worse, her typical migraine are lateralized retro-orbital area severe pounding headache, with  associated photo/phono/osmophobia, nausea vomitting, can last hours to days,  Over the years, she has tried and failed different preventive medication such as Topamax, Depakote, Lyrica, she is currently taking gabapentin,  Over the past few years, she complains of daily headaches, has been taking over-the-counter Aleve migraine, Excedrin Migraine, ice packs, Goody Powder on a daily basis, for a while she was taking Maxalt, Stadol nasal spray, but is no longer on those medications, Maxalt does help her headache, sumatriptan caused heart palpitations  She reported history of Botox injection in 2004 once, which has been very helpful, lasting for 2 and half years, for insurance reasons, also worry about the injection pain, she only received once.  She had a few sets of Botox injection in 2013, most recent from August 2013, which only help her mild to moderately, she was also under the care of pain management, is receiving epidural injection for neck pain, she used to  stadol as rescue therapy for her headaches  She was admitted to the hospital in January 2014 for severe pneumonia, she also developed kidney failure, requiring hemodialysis, she has bilateral fingertips, toes gangrene, require right index finger amputation, has prolonged ICU stay, tracheostomy, She was discharged to nursing home initially, she is now back home, she complains of constant  daily headaches, starting  from upper and nuchal area, spreading forward, bilateral temporal, frontal region, pressure, moderate, with associated light noise sensitivity, Maxalt provided moderate help, Imitrex SQ was very helpful.   she is also taking gabapentin 300 mg 4 times a day,    Review of Systems  Out of a complete 14 system review, the patient complains of only the following symptoms, and all other reviewed systems are negative.   Social History Unemployed.Graduate equivalency degree. Divorced. Has 2 children.Smokes one cigarette daily. Formerly consumed alcohol Inhaled Tobacco Use: Current every day smoker  Family History  father and mother are both alive.  Past Medical History  She is positive for kidney stones, Tourette's syndrome, polysubstance abuse, cocaine use with cardiac ischemia,aspiration pneumonia,gastroesophageal reflux disease, peptic ulcer disease, elevated liver function tests, bipolar disease.Obstructive sleep apnea  Surgical History  1. Endotracheal tube placement 01/10/2011. 2. Right internal jugular triple lumen catheter placement 12/2010 3. Right femoral line.  Neurologic Exam  Mental Status: depressed looking, awake, alert, cooperative to history, talking, and casual conversation. Cranial Nerves: CN II-XII pupils were equal round reactive to light.  Extraocular movements were full.  Visual fields were full on confrontational test.  Facial sensation and strength were normal.  Hearing was intact to finger rubbing bilaterally.  Uvula tongue were midline.  Head turning and shoulder shrugging were normal and symmetric.  Tongue protrusion into the cheeks strength were normal.  Motor: move all 4 extremities without difficulty Sensory: Normal to light touch  Coordination:  There was no dysmetria noticed. Gait and Station: Narrow based and steady    Assessment  and plan  42 years old Caucasian female, with chronic migraine headaches, normal neurological examinations,  1 Nortriptyline  titrating  to 25 mg 2 tablets every night as preventive medications 2, Maxalt as needed, Imitrex subcutaneous injection as needed for abortive treatment 3. return to clinic in 6 months with [name]

## 2013-02-06 ENCOUNTER — Other Ambulatory Visit: Payer: Self-pay | Admitting: Neurology

## 2013-02-20 DIAGNOSIS — Z0289 Encounter for other administrative examinations: Secondary | ICD-10-CM

## 2013-03-08 ENCOUNTER — Other Ambulatory Visit: Payer: Self-pay | Admitting: Neurology

## 2013-03-08 ENCOUNTER — Telehealth: Payer: Self-pay | Admitting: Neurology

## 2013-03-08 MED ORDER — METHYLPREDNISOLONE 4 MG PO KIT: PACK | ORAL | Status: DC

## 2013-03-08 NOTE — Telephone Encounter (Signed)
Patient called with migraine and cervical neck pain refractory to all current medications. Will call in medrol dose pack. Patient instructed to follow up with Dr Terrace Arabia

## 2013-03-17 ENCOUNTER — Telehealth: Payer: Self-pay | Admitting: Neurology

## 2013-03-17 MED ORDER — BUTORPHANOL TARTRATE 10 MG/ML NA SOLN: 1.0000 | NASAL | Status: DC | PRN

## 2013-03-17 NOTE — Telephone Encounter (Signed)
She had migraine for 2 weeks now, she has tried imitrex injection multiple times, without helping.  I have advised her to come by office for iv infusion, depacon 500mg  iv may repeat once, also Toradol 30mg  iv, compazine 10mg  iv once.  I wrote rx of stadol 2.5mg  one spray as needed for migraine, 0 refill, she has history of polysubstance abuse per history.  she will come in to pick up prescription, after iv infusion of above.

## 2013-03-18 ENCOUNTER — Ambulatory Visit (INDEPENDENT_AMBULATORY_CARE_PROVIDER_SITE_OTHER): Payer: Medicaid Other | Admitting: Neurology

## 2013-03-18 ENCOUNTER — Other Ambulatory Visit: Payer: Self-pay

## 2013-03-18 VITALS — BP 114/75 | HR 77

## 2013-03-18 DIAGNOSIS — G43909 Migraine, unspecified, not intractable, without status migrainosus: Secondary | ICD-10-CM

## 2013-03-18 MED ORDER — VALPROATE SODIUM 500 MG/5ML IV SOLN
1000.0000 mg | INTRAVENOUS | Status: AC
  Administered 2013-03-18: 1000 mg via INTRAVENOUS

## 2013-03-18 MED ORDER — BUTORPHANOL TARTRATE 10 MG/ML NA SOLN: 1.0000 | NASAL | Status: DC | PRN

## 2013-03-18 MED ORDER — PROCHLORPERAZINE EDISYLATE 5 MG/ML IJ SOLN
10.0000 mg | Freq: Once | INTRAMUSCULAR | Status: AC
  Administered 2013-03-18: 10 mg via INTRAVENOUS

## 2013-03-18 NOTE — Patient Instructions (Signed)
Patient left with driver and going to beach.

## 2013-03-18 NOTE — Progress Notes (Signed)
Patient here for Depacon IV.  Order for Depacon 500mg  may repeat x1, Toradol 30mg  IV, Compazine 10mg  IV.  Patient to treatment room, very talkative.  Headache pain level 7.  IV started in left wrist, 24g angiocath.  Depacon 500mg /100cc NS started at 0825, level 5.  When completed 100cc NS started and Compazine 10mg  diluted in 5cc NS pushed IV.   Then another Depacon 500mg  given IV.  Patient stated headache gone just a feeling of fuzziness.  Toradol not given because of past allergic reaction.  Another 100cc NS given IV to flush.  Upon completion IV stopped and removed.  Patient to checkout in NAD.

## 2013-04-20 ENCOUNTER — Ambulatory Visit: Payer: Medicaid Other | Admitting: Nurse Practitioner

## 2013-05-18 ENCOUNTER — Ambulatory Visit (INDEPENDENT_AMBULATORY_CARE_PROVIDER_SITE_OTHER): Payer: Medicaid Other | Admitting: Nurse Practitioner

## 2013-05-18 ENCOUNTER — Encounter: Payer: Self-pay | Admitting: Nurse Practitioner

## 2013-05-18 ENCOUNTER — Encounter (INDEPENDENT_AMBULATORY_CARE_PROVIDER_SITE_OTHER): Payer: Self-pay

## 2013-05-18 VITALS — BP 103/66 | HR 74 | Ht 61.0 in | Wt 182.0 lb

## 2013-05-18 DIAGNOSIS — G43909 Migraine, unspecified, not intractable, without status migrainosus: Secondary | ICD-10-CM

## 2013-05-18 DIAGNOSIS — G4700 Insomnia, unspecified: Secondary | ICD-10-CM

## 2013-05-18 DIAGNOSIS — G4730 Sleep apnea, unspecified: Secondary | ICD-10-CM

## 2013-05-18 MED ORDER — RIZATRIPTAN BENZOATE 10 MG PO TABS: 10.0000 mg | ORAL_TABLET | ORAL | Status: DC | PRN

## 2013-05-18 NOTE — Progress Notes (Signed)
[facility]  PATIENT: [name] DOB: October 29, 1970   REASON FOR VISIT: follow up for Migraine  HISTORY OF PRESENT ILLNESS: [name], 43 year old female returns for followup. She was last seen by Dr. Terrace Arabia 01/23/2013. At that time she was placed on nortriptyline as a preventive but she only took the medication for a couple of days and says she had side effects. She has been on Topamax, Depakote, and Lyrica in the past with side effects. She is currently also on gabapentin. She takes sumatriptan injections, and has Maxalt acutely as well however the patient does not understand that she can't get both prescriptions at the same time. She has a very mild headache today. She had a prednisone Dosepak over  the holidays. She returns for reevaluation. She has multiple polypharmacy.  HISTORY:. She was patient of Dr. Sandria Manly, I saw her previously for Botox injection as migraine prevention, last office visit was August 2013 She has past medical history of peptic ulcer, kidney stone, bipolar, history of polysubstance abuse,  She presented with chronic migraine headaches since age 57, progressively worse, her typical migraine are lateralized retro-orbital area severe pounding headache, with associated photo/phono/osmophobia, nausea vomitting, can last hours to days,  Over the years, she has tried and failed different preventive medication such as Topamax, Depakote, Lyrica, she is currently taking gabapentin,  Over the past few years, she complains of daily headaches, has been taking over-the-counter Aleve migraine, Excedrin Migraine, ice packs, Goody Powder on a daily basis, for a while she was taking Maxalt, Stadol nasal spray, but is no longer on those medications, Maxalt does help her headache, sumatriptan caused heart palpitations  She reported history of Botox injection in 2004 once, which has been very helpful, lasting for 2 and half years, for insurance reasons, also worry about the  injection pain, she only received once.  She had a few sets of Botox injection in 2013, most recent from August 2013, which only help her mild to moderately, she was also under the care of pain management, is receiving epidural injection for neck pain, she used to stadol as rescue therapy for her headaches  She was admitted to the hospital in January 2014 for severe pneumonia, she also developed kidney failure, requiring hemodialysis, she has bilateral fingertips, toes gangrene, require right index finger amputation, has prolonged ICU stay, tracheostomy, She was discharged to nursing home initially, she is now back home, she complains of constant daily headaches, starting from upper and nuchal area, spreading forward, bilateral temporal, frontal region, pressure, moderate, with associated light noise sensitivity, Maxalt provided moderate help, Imitrex SQ was very helpful. she is also taking gabapentin 300 mg 4 times a day,      REVIEW OF SYSTEMS: Full 14 system review of systems performed and notable only for those listed, all others are neg:  Constitutional: Fatigue  Cardiovascular: Leg swelling  Ear/Nose/Throat: Neck pain neck stiffness Skin: N/A  Eyes: N/A  Respiratory: N/A  Gastroitestinal: N/A  Hematology/Lymphatic: N/A  Endocrine: Heat and cold intolerance  Musculoskeletal:N/A  Allergy/Immunology: N/A  Neurological: N/A Psychiatric: N/A   ALLERGIES: Allergies  Allergen Reactions  . Ciprofloxacin     REACTION: Hives  . Diphenhydramine Hcl Other (See Comments)  . Divalproex Sodium Swelling  . Ketorolac Tromethamine Other (See Comments)    Bladder retintion  . Trazodone And Nefazodone      Not agree with herTourette's   . Valproic Acid Swelling  . Sumatriptan Palpitations    REACTION: Palpitations  HOME MEDICATIONS: Outpatient Prescriptions Prior to Visit  Medication Sig Dispense Refill  . albuterol (PROVENTIL HFA;VENTOLIN HFA) 108 (90 BASE) MCG/ACT inhaler Inhale 2  puffs into the lungs every 6 (six) hours as needed. For shortness breath      . baclofen (LIORESAL) 10 MG tablet Take 10 mg by mouth every 8 (eight) hours as needed. For muscle spasms      . esomeprazole (NEXIUM) 40 MG capsule Take 40 mg by mouth 2 (two) times daily.      . ferrous fumarate (HEMOCYTE - 106 MG FE) 325 (106 FE) MG TABS Take 2 tablets by mouth daily.       Marland Kitchen FLUoxetine (PROZAC) 40 MG capsule Take 20 mg by mouth daily.       Marland Kitchen gabapentin (NEURONTIN) 300 MG capsule Take 300 mg by mouth 4 (four) times daily.       Marland Kitchen loperamide (IMODIUM) 2 MG capsule Take 2 mg by mouth 4 (four) times daily as needed. For diarrhea      . pseudoephedrine-acetaminophen (TYLENOL SINUS) 30-500 MG TABS Take 2 tablets by mouth every 4 (four) hours as needed. Sinus pressure      . ranitidine (ZANTAC) 150 MG tablet Take 150 mg by mouth at bedtime.      . SUMAtriptan Succinate Refill 6 MG/0.5ML SOCT Inject 6 mg into the skin as needed.  12 cartridge  12  . tiZANidine (ZANAFLEX) 2 MG tablet TAKE 1 TABLET BY MOUTH 3 TIMES A DAY  90 tablet  5  . zolpidem (AMBIEN) 5 MG tablet Take 5 mg by mouth at bedtime as needed. For insomnia      . butorphanol (STADOL) 10 MG/ML nasal spray Place 1 spray into the nose every 4 (four) hours as needed for headache.  2.5 mL  0  . cholecalciferol (VITAMIN D) 1000 UNITS tablet Take 1,000 Units by mouth 2 (two) times daily.      Marland Kitchen lidocaine (XYLOCAINE) 5 % ointment Apply 1 application topically 2 (two) times daily. Apply to affected area(s) on neck      . methocarbamol (ROBAXIN) 500 MG tablet Take 1 tablet (500 mg total) by mouth 3 (three) times daily.  30 tablet  0  . methylPREDNISolone (MEDROL DOSEPAK) 4 MG tablet follow package directions  21 tablet  0  . nortriptyline (PAMELOR) 25 MG capsule Take 2 capsules (50 mg total) by mouth at bedtime. One capsule po qhs xone week, then  60 capsule  12  . Pimozide 1 MG TABS Take 0.5 mg by mouth at bedtime.      . rizatriptan (MAXALT) 10 MG  tablet Take 1 tablet (10 mg total) by mouth as needed for migraine. May repeat in 2 hours if needed  15 tablet  12   Facility-Administered Medications Prior to Visit  Medication Dose Route Frequency Provider Last Rate Last Dose  . valproate (DEPACON) 1,000 mg in sodium chloride 0.9 % 100 mL IVPB  1,000 mg Intravenous Continuous Levert Feinstein, MD   1,000 mg at 03/18/13 4098    PAST MEDICAL HISTORY: Past Medical History  Diagnosis Date  . Renal insufficiency   . Asthma   . Coronary artery disease   . COPD (chronic obstructive pulmonary disease)   . Diabetes mellitus   . HEMORRHOIDS, INTERNAL   . ANEMIA OF CHRONIC DISEASE   . Tourette's disorder   . INSOMNIA, CHRONIC   . DEPRESSION   . 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 ACID REFLUX DISEASE   .  Irritable bowel syndrome   . MENOPAUSE, SURGICAL   . DEGENERATIVE DISC DISEASE   . BACK PAIN   . Renal colic   . ALCOHOL ABUSE, HX OF   . PERSONAL HX OF METHICILLIN RESIST STAPH AUREUS   . DRUG ABUSE, HX OF   . BLADDER SUSPENSION, HX OF   . CANDIDIASIS   . PNEUMONIA, COMMUNITY ACQUIRED, PNEUMOCOCCAL   . Nephrolithiasis   . PUD (peptic ulcer disease)   . Migraine     PAST SURGICAL HISTORY: Past Surgical History  Procedure Laterality Date  . Abdominal hysterectomy    . Eye surgery    . Bladder tack      FAMILY HISTORY: Family History  Problem Relation Age of Onset  . Heart disease      No family history    SOCIAL HISTORY: History   Social History  . Marital Status: Legally Separated    Spouse Name: N/A    Number of Children: 2  . Years of Education: N/A   Occupational History  .      Unemployed   Social History Main Topics  . Smoking status: Current Every Day Smoker -- 0.01 packs/day    Types: Cigarettes  . Smokeless tobacco: Never Used  . Alcohol Use: Yes     Comment: occasionally  . Drug Use: No     Comment: Previous use of marijuana and cocaine  . Sexual Activity: Not on file   Other Topics Concern  . Not  on file   Social History Narrative   Patient is unemployed. Graduated from equivalency degree.   Divorced.     PHYSICAL EXAM  Filed Vitals:   05/18/13 0909  BP: 103/66  Pulse: 74  Height: 5\' 1"  (1.549 m)  Weight: 182 lb (82.555 kg)   Body mass index is 34.41 kg/(m^2).  Generalized: Well developed, in no acute distress  Head: normocephalic and atraumatic,. Oropharynx benign  Neck: Supple Cardiac: Regular rate rhythm Musculoskeletal: No deformity   Neurological examination   Mentation: Alert oriented to time, place, history taking. Follows all commands speech and language fluent  Cranial nerve II-XII: .Pupils were equal round reactive to light extraocular movements were full, visual field were full on confrontational test. Facial sensation and strength were normal. hearing was intact to finger rubbing bilaterally. Uvula tongue midline. head turning and shoulder shrug were normal and symmetric.Tongue protrusion into cheek strength was normal. Motor: normal bulk and tone, full strength in the BUE, BLE,  No focal weakness Sensory: normal and symmetric to light touch, pinprick, and  vibration  Coordination: finger-nose-finger, heel-to-shin bilaterally, no dysmetria Reflexes: Brachioradialis 2/2, biceps 2/2, triceps 2/2, patellar 2/2, Achilles 2/2, plantar responses were flexor bilaterally. Gait and Station: Rising up from seated position without assistance, normal stance,  moderate stride, good arm swing, smooth turning, able to perform tiptoe, and heel walking without difficulty. Tandem gait is steady  DIAGNOSTIC DATA (LABS, IMAGING, TESTING) -None to review     ASSESSMENT AND PLAN  43 y.o. year old female  has a past medical history ofAsthma; Coronary artery disease; COPD (chronic obstructive pulmonary disease); Diabetes mellitus;ANEMIA OF CHRONIC DISEASE; Tourette's disorder; INSOMNIA, CHRONIC; DEPRESSION; BLINDNESS, LEGAL,  Irritable bowel syndrome;  DEGENERATIVE DISC  DISEASE; BACK PAIN; Renal colic; ALCOHOL ABUSE, HX OF;  DRUG ABUSE, HX OF;  and Migraine. here to followup with migraine. Patient has a normal neurologic exam. She is on polypharmacy and was unable to tolerate nortriptyline due to side effects.  Continue Maxalt and Sumatriptan as  ordered Given neck exercises to perform Continue Gabapentin which is a migraine preventive Will not add another medication due to her  polypharmacy F/U in 6 months  Nilda RiggsNancy Carolyn Daishawn Lauf, [facility] LLCGNP, [facility] CenterBC, APRN  [facility] [address], [address] [place], [place] [id number] [phone]

## 2013-05-18 NOTE — Patient Instructions (Signed)
Continue Maxalt and Sumatriptan as ordered Given neck exercises to perform Continue Gabapentin which is a migraine preventive F/U in 6 months

## 2013-05-29 ENCOUNTER — Other Ambulatory Visit (HOSPITAL_COMMUNITY): Payer: Self-pay | Admitting: Urology

## 2013-05-29 DIAGNOSIS — N1330 Unspecified hydronephrosis: Secondary | ICD-10-CM

## 2013-06-11 ENCOUNTER — Ambulatory Visit (HOSPITAL_COMMUNITY): Payer: Medicaid Other

## 2013-06-15 ENCOUNTER — Ambulatory Visit (HOSPITAL_COMMUNITY): Payer: Medicaid Other

## 2013-06-18 ENCOUNTER — Encounter (HOSPITAL_COMMUNITY): Payer: Medicaid Other

## 2013-06-23 ENCOUNTER — Ambulatory Visit (HOSPITAL_COMMUNITY)
Admission: RE | Admit: 2013-06-23 | Discharge: 2013-06-23 | Disposition: A | Discharge status: Home / Self Care | Payer: Medicaid Other | Source: Ambulatory Visit | Attending: Urology | Admitting: Urology

## 2013-06-23 DIAGNOSIS — M549 Dorsalgia, unspecified: Secondary | ICD-10-CM | POA: Insufficient documentation

## 2013-06-23 DIAGNOSIS — N1330 Unspecified hydronephrosis: Secondary | ICD-10-CM

## 2013-06-23 MED ORDER — TECHNETIUM TC 99M MERTIATIDE
16.1000 | Freq: Once | INTRAVENOUS | Status: AC | PRN
  Administered 2013-06-23: 16.1 via INTRAVENOUS

## 2013-06-23 MED ORDER — FUROSEMIDE 10 MG/ML IJ SOLN
40.0000 mg | Freq: Once | INTRAMUSCULAR | Status: DC
  Filled 2013-06-23: qty 4

## 2013-08-05 ENCOUNTER — Other Ambulatory Visit: Payer: Self-pay | Admitting: Neurology

## 2013-09-10 ENCOUNTER — Other Ambulatory Visit: Payer: Self-pay | Admitting: Nurse Practitioner

## 2013-11-10 ENCOUNTER — Ambulatory Visit: Payer: Medicaid Other | Admitting: Nurse Practitioner

## 2013-11-17 ENCOUNTER — Telehealth: Payer: Self-pay | Admitting: *Deleted

## 2013-11-17 NOTE — Telephone Encounter (Signed)
Patient calling back, has a conflict with appointment for 7/29 at 3:30, appointment has been cancelled patient states that she will call back later and r/s appointment.

## 2013-11-17 NOTE — Telephone Encounter (Signed)
Called patient to r/s appointment with NP CM, from 11/30/13, patient was r/s to 11/18/13 at 3:30 pm.

## 2013-11-18 ENCOUNTER — Ambulatory Visit: Payer: Self-pay | Admitting: Nurse Practitioner

## 2013-11-30 ENCOUNTER — Ambulatory Visit: Payer: Medicaid Other | Admitting: Nurse Practitioner

## 2013-12-03 ENCOUNTER — Telehealth: Payer: Self-pay | Admitting: Nurse Practitioner

## 2013-12-03 NOTE — Telephone Encounter (Signed)
FYI, called patient and scheduled her for a follow up tomorrow at 230 pm. She has not been seen since Jan and has had a migraine for 4 days now. Cm/yan

## 2013-12-03 NOTE — Telephone Encounter (Signed)
noted 

## 2013-12-03 NOTE — Telephone Encounter (Signed)
Patent calling to state that she's had a migraine for 4 days now and she says nothing is helping, please return call and advise.

## 2013-12-04 ENCOUNTER — Ambulatory Visit (INDEPENDENT_AMBULATORY_CARE_PROVIDER_SITE_OTHER): Payer: Medicaid Other | Admitting: Nurse Practitioner

## 2013-12-04 ENCOUNTER — Encounter: Payer: Self-pay | Admitting: Nurse Practitioner

## 2013-12-04 VITALS — BP 108/72 | HR 76 | Ht 61.5 in | Wt 191.0 lb

## 2013-12-04 DIAGNOSIS — G43909 Migraine, unspecified, not intractable, without status migrainosus: Secondary | ICD-10-CM

## 2013-12-04 MED ORDER — METHYLPREDNISOLONE 4 MG PO KIT: PACK | ORAL | Status: DC

## 2013-12-04 MED ORDER — BUTORPHANOL TARTRATE 10 MG/ML NA SOLN: 1.0000 | NASAL | Status: DC | PRN

## 2013-12-04 NOTE — Progress Notes (Signed)
[facility]  PATIENT: [name] DOB: 12/01/1970   REASON FOR VISIT: Followup for headache   HISTORY OF PRESENT ILLNESS:[name], 43 year old female returns for followup. She was last seen 05/18/2013. She has had multiple preventive meds to include  nortriptyline Topamax, Depakote, and Lyrica in the past with side effects. She is currently also on gabapentin. She takes sumatriptan injections, and has Maxalt acutely as well however the patient does not understand that she can't get both prescriptions at the same time. She has a  headache today that has lasted for 4 days. Her last prednisone Dosepak was November of last year she has also had a prescription for Stadol in the past that was helpful to break her headache cycle She has multiple polypharmacy.  She has a history of polysubstance abuse. She returns for reevaluation  HISTORY:. She was patient of [name], I saw her previously for Botox injection as migraine prevention, last office visit was August 2013 She has past medical history of peptic ulcer, kidney stone, bipolar, history of polysubstance abuse,  She presented with chronic migraine headaches since age 41, progressively worse, her typical migraine are lateralized retro-orbital area severe pounding headache, with associated photo/phono/osmophobia, nausea vomitting, can last hours to days,  Over the years, she has tried and failed different preventive medication such as Topamax, Depakote, Lyrica, she is currently taking gabapentin,  Over the past few years, she complains of daily headaches, has been taking over-the-counter Aleve migraine, Excedrin Migraine, ice packs, Goody Powder on a daily basis, for a while she was taking Maxalt, Stadol nasal spray, but is no longer on those medications, Maxalt does help her headache, sumatriptan caused heart palpitations  She reported history of Botox injection in 2004 once, which has been very helpful, lasting for 2 and half  years, for insurance reasons, also worry about the injection pain, she only received once.  She had a few sets of Botox injection in 2013, most recent from August 2013, which only help her mild to moderately, she was also under the care of pain management, is receiving epidural injection for neck pain, she used to stadol as rescue therapy for her headaches  She was admitted to the hospital in January 2014 for severe pneumonia, she also developed kidney failure, requiring hemodialysis, she has bilateral fingertips, toes gangrene, require right index finger amputation, has prolonged ICU stay, tracheostomy, She was discharged to nursing home initially, she is now back home, she complains of constant daily headaches, starting from upper and nuchal area, spreading forward, bilateral temporal, frontal region, pressure, moderate, with associated light noise sensitivity, Maxalt provided moderate help, Imitrex SQ was very helpful. she is also taking gabapentin 300 mg 4 times a day,      REVIEW OF SYSTEMS: Full 14 system review of systems performed and notable only for those listed, all others are neg:  Constitutional:  HeCardiovascular: N/A  Ear/Nose/Throat: N/A  Skin: N/A  Eyes: N/A  Respiratory: N/A  Gastroitestinal: N/A  Hematology/Lymphatic: N/A  Endocrine:  Flushing Musculoskeletal: Back pain, neck pain Allergy/Immunology: N/A  Neurological:  Headache  Psychiatric:  Depression anxiety, Tourette's  Sleep : NA   ALLERGIES: Allergies  Allergen Reactions  . Ciprofloxacin Hives    REACTION: Hives  . Diphenhydramine     Other reaction(s): Hallucinations  . Diphenhydramine Hcl Other (See Comments)  . Diphenhydramine Hcl     Other reaction(s): DELIRIUM  . Divalproex Sodium Swelling    Other reaction(s): Other swelling  .  Heparin Other (See Comments)    Other reaction(s): HYPERTENSION,SWELLING Raises BP and swells  . Ketorolac     Other reaction(s): URINARY RETENTION  . Ketorolac  Tromethamine Other (See Comments)    Bladder Retention Bladder retention Bladder retintion  . Nefazodone   . Other     Other reaction(s): Other (See Comments) Per Lastword Other reaction(s): Agitation  . Sertraline   . Tramadol Other (See Comments)    Bladder retention  . Trazodone   . Trazodone And Nefazodone     Other reaction(s): Other hyperactive  Not agree with herTourette's   . Valproic Acid Swelling    Other reaction(s): SWELLING Other reaction(s): Facial Edema (intolerance)  . Sumatriptan Palpitations    REACTION: Palpitations    HOME MEDICATIONS: Outpatient Prescriptions Prior to Visit  Medication Sig Dispense Refill  . albuterol (PROVENTIL HFA;VENTOLIN HFA) 108 (90 BASE) MCG/ACT inhaler Inhale 2 puffs into the lungs every 6 (six) hours as needed. For shortness breath      . baclofen (LIORESAL) 10 MG tablet Take 10 mg by mouth every 8 (eight) hours as needed. For muscle spasms      . diazepam (VALIUM) 5 MG tablet Take 5 mg by mouth 3 (three) times daily.      Marland Kitchen esomeprazole (NEXIUM) 40 MG capsule Take 40 mg by mouth 2 (two) times daily.      . ferrous fumarate (HEMOCYTE - 106 MG FE) 325 (106 FE) MG TABS Take 2 tablets by mouth daily.       Marland Kitchen FLUoxetine (PROZAC) 40 MG capsule Take 40 mg by mouth daily.       Marland Kitchen gabapentin (NEURONTIN) 300 MG capsule Take 300 mg by mouth 4 (four) times daily.       Marland Kitchen loperamide (IMODIUM) 2 MG capsule Take 2 mg by mouth 4 (four) times daily as needed. For diarrhea      . pseudoephedrine-acetaminophen (TYLENOL SINUS) 30-500 MG TABS Take 2 tablets by mouth every 4 (four) hours as needed. Sinus pressure      . ranitidine (ZANTAC) 150 MG tablet Take 150 mg by mouth at bedtime.      . rivaroxaban (XARELTO) 10 MG TABS tablet Take 20 mg by mouth daily.      . rizatriptan (MAXALT) 10 MG tablet TAKE 1 TABLET (10 MG TOTAL) BY MOUTH AS NEEDED FOR MIGRAINE. MAY REPEAT IN 2 HOURS IF NEEDED  10 tablet  3  . SUMAtriptan Succinate Refill 6 MG/0.5ML SOCT  Inject 6 mg into the skin as needed.  12 cartridge  12  . tiZANidine (ZANAFLEX) 2 MG tablet TAKE 1 TABLET BY MOUTH 3 TIMES A DAY  90 tablet  5  . zolpidem (AMBIEN) 5 MG tablet Take 5 mg by mouth at bedtime as needed. For insomnia       Facility-Administered Medications Prior to Visit  Medication Dose Route Frequency Provider Last Rate Last Dose  . valproate (DEPACON) 1,000 mg in sodium chloride 0.9 % 100 mL IVPB  1,000 mg Intravenous Continuous Levert Feinstein, MD   1,000 mg at 03/18/13 1610    PAST MEDICAL HISTORY: Past Medical History  Diagnosis Date  . Renal insufficiency   . Asthma   . Coronary artery disease   . COPD (chronic obstructive pulmonary disease)   . Diabetes mellitus   . HEMORRHOIDS, INTERNAL   . ANEMIA OF CHRONIC DISEASE   . Tourette's disorder   . INSOMNIA, CHRONIC   . DEPRESSION   . 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  ACID REFLUX DISEASE   . Irritable bowel syndrome   . MENOPAUSE, SURGICAL   . DEGENERATIVE DISC DISEASE   . BACK PAIN   . Renal colic   . ALCOHOL ABUSE, HX OF   . PERSONAL HX OF METHICILLIN RESIST STAPH AUREUS   . DRUG ABUSE, HX OF   . BLADDER SUSPENSION, HX OF   . CANDIDIASIS   . PNEUMONIA, COMMUNITY ACQUIRED, PNEUMOCOCCAL   . Nephrolithiasis   . PUD (peptic ulcer disease)   . Migraine     PAST SURGICAL HISTORY: Past Surgical History  Procedure Laterality Date  . Abdominal hysterectomy    . Eye surgery    . Bladder tack      FAMILY HISTORY: Family History  Problem Relation Age of Onset  . Heart disease      No family history    SOCIAL HISTORY: History   Social History  . Marital Status: Legally Separated    Spouse Name: N/A    Number of Children: 2  . Years of Education: N/A   Occupational History  .      Unemployed   Social History Main Topics  . Smoking status: Current Every Day Smoker -- 0.01 packs/day    Types: Cigarettes  . Smokeless tobacco: Never Used  . Alcohol Use: Yes     Comment: occasionally  . Drug Use:  No     Comment: Previous use of marijuana and cocaine  . Sexual Activity: Not on file   Other Topics Concern  . Not on file   Social History Narrative   Patient is unemployed. Graduated from equivalency degree.   Divorced.     PHYSICAL EXAM  Filed Vitals:   12/04/13 1451  BP: 108/72  Pulse: 76  Height: 5' 1.5" (1.562 m)  Weight: 191 lb (86.637 kg)   Body mass index is 35.51 kg/(m^2). Generalized: Well developed, in no acute distress  Head: normocephalic and atraumatic,. Oropharynx benign  Neck: Supple  Cardiac: Regular rate rhythm  Musculoskeletal: No deformity  Neurological examination  Mentation: Alert oriented to time, place, history taking. Follows all commands speech and language fluent  Cranial nerve II-XII: .Pupils were equal round reactive to light extraocular movements were full, visual field were full on confrontational test. Facial sensation and strength were normal. hearing was intact to finger rubbing bilaterally. Uvula tongue midline. head turning and shoulder shrug were normal and symmetric.Tongue protrusion into cheek strength was normal.  Motor: normal bulk and tone, full strength in the BUE, BLE, No focal weakness  Sensory: normal and symmetric to light touch, pinprick, and vibration  Coordination: finger-nose-finger, heel-to-shin bilaterally, no dysmetria  Reflexes: Brachioradialis 2/2, biceps 2/2, triceps 2/2, patellar 2/2, Achilles 2/2, plantar responses were flexor bilaterally.  Gait and Station: Rising up from seated position without assistance, normal stance, moderate stride, good arm swing, smooth turning, able to perform tiptoe, and heel walking without difficulty. Tandem gait is steady  DIAGNOSTIC DATA (LABS, IMAGING, TESTING) -ASSESSMENT AND PLAN  43 y.o. year old female  has a past medical history of Renal insufficiency; Asthma; Coronary artery disease; COPD (chronic obstructive pulmonary disease); Diabetes mellitus;  ANEMIA OF CHRONIC DISEASE;  Tourette's disorder; INSOMNIA, CHRONIC; DEPRESSION; BLINDNESS, LEGAL, Irritable bowel syndrome; MENOPAUSE, SURGICAL; DEGENERATIVE DISC DISEASE; BACK PAIN; Renal colic; ALCOHOL ABUSE, DRUG ABUSE,  Nephrolithiasis; PUD (peptic ulcer disease); and Migraine. here  to followup. She had a migraine for 4 days. She is a patient of Dr. Terrace Arabia who is out of the office  Stadol nasal  spray take as directed, no refills, RX given Medrol dose pack take as directed RX given Continue other meds, Gabapentin and Tizanidine, Maxalt and Sumatriptan injections.  F/U 6 months Nilda RiggsNancy Carolyn Martin, [facility] HospitalGNP, [facility], APRN  [facility] [address], [address] [place], [place] [address] [phone]

## 2013-12-04 NOTE — Patient Instructions (Signed)
Stadol nasal spray take as directed Medrol dose pack take as directed Continue other meds F/U 6 months

## 2013-12-07 NOTE — Progress Notes (Signed)
I agree with the assessment and plan as directed by NP .The patient is known to me .   Carrieanne Kleen, MD  

## 2014-01-19 ENCOUNTER — Telehealth: Payer: Self-pay | Admitting: Neurology

## 2014-01-19 MED ORDER — RIZATRIPTAN BENZOATE 10 MG PO TABS: ORAL_TABLET | ORAL | Status: DC

## 2014-01-19 NOTE — Telephone Encounter (Signed)
Sushama, [facility] Pharmacist @ [phone], checking status of refill request for rizatriptan (MAXALT) 10 MG tablet.  Please call and advise.

## 2014-01-19 NOTE — Telephone Encounter (Signed)
Rx has been sent  

## 2014-01-29 ENCOUNTER — Other Ambulatory Visit: Payer: Self-pay | Admitting: Neurology

## 2014-02-04 ENCOUNTER — Other Ambulatory Visit: Payer: Self-pay | Admitting: Neurology

## 2014-02-08 ENCOUNTER — Telehealth: Payer: Self-pay | Admitting: Nurse Practitioner

## 2014-02-08 NOTE — Telephone Encounter (Signed)
Morrie Sheldonshley from [facility] [place] calling to request that her quantity of sumatriptan injections be changed from 12 injections to 12 boxes. Please return call and advise.

## 2014-02-08 NOTE — Telephone Encounter (Signed)
I called back.  Spoke with Morrie SheldonAshley.  She said the patient wants us to change her Rx so that she can get 24 syringes of Sumatriptan per 30 days instead of 12 syringes per 30 days.  Would like a message sent to the provider asking for an increase in quantity.  (Her ins will likely not pay for this amount)  Please advise.  Thank you.

## 2014-02-09 MED ORDER — SUMATRIPTAN SUCCINATE 6 MG/0.5ML ~~LOC~~ SOAJ: 6.0000 mg | SUBCUTANEOUS | Status: DC | PRN

## 2014-02-09 NOTE — Telephone Encounter (Signed)
[name], please tell patient that I have changed the imitrex injection to 24, but she should not use more than one injection/each day, not more than 3 injections/each week.  Also let her know, there are limitations from her insurance company, not from prescriber

## 2014-02-09 NOTE — Telephone Encounter (Signed)
I called the patient back.  Got no answer.  Left message. 

## 2014-02-18 ENCOUNTER — Ambulatory Visit: Payer: Self-pay | Admitting: Nurse Practitioner

## 2014-03-04 ENCOUNTER — Telehealth: Payer: Self-pay | Admitting: Neurology

## 2014-03-04 ENCOUNTER — Telehealth: Payer: Self-pay | Admitting: Nurse Practitioner

## 2014-03-04 MED ORDER — METHYLPREDNISOLONE 4 MG PO KIT: PACK | ORAL | Status: DC

## 2014-03-04 MED ORDER — CHLORPROMAZINE HCL 100 MG PO TABS: 100.0000 mg | ORAL_TABLET | Freq: Two times a day (BID) | ORAL | Status: DC

## 2014-03-04 MED ORDER — PREDNISONE 5 MG PO TABS: ORAL_TABLET | ORAL | Status: DC

## 2014-03-04 NOTE — Telephone Encounter (Signed)
I called the patient back, got no answer.  Left message.  

## 2014-03-04 NOTE — Telephone Encounter (Signed)
I called back.  Patient says she has had a migraine for approx 5 days.  Says she has used 1 Imitrex injection every day for the past 5 days, with the exception of yesterday, when she used 2.  Feels as though nothing alleviates symptoms.  (FYI: Says she is taking an NSAID and also uses Nucynta narcotic pain med from other doctor for other pain she has.)  She would like a [facility] asking for her recommendation.  Please advise.  Thank you.   (Patient was last seen in August, has follow up appt scheduled in Feb.)

## 2014-03-04 NOTE — Telephone Encounter (Signed)
I called the patient. I left a message. She called tonight indicating that she had a 5 day headache, but just now called after hours. I will call back later.

## 2014-03-04 NOTE — Telephone Encounter (Signed)
I called the patient. She continues to have a bad headache. I will call in a Rx for prednisone and thorazine for the headache.

## 2014-03-04 NOTE — Telephone Encounter (Signed)
Patient's experiencing a Migraine for 5 days and Rx SUMAtriptan Succinate Refill 6 MG/0.5ML SOCT last for about 30 minutes and migraines rebounds.  Please call and advise.

## 2014-03-04 NOTE — Telephone Encounter (Signed)
Stop the NSAID as it causes rebound. Also narcotic medications  cause rebound. She can try a medrol dose pack for 6 days.

## 2014-03-16 ENCOUNTER — Encounter: Payer: Self-pay | Admitting: Neurology

## 2014-03-17 ENCOUNTER — Ambulatory Visit: Payer: Medicaid Other | Admitting: Podiatry

## 2014-03-23 ENCOUNTER — Encounter: Payer: Self-pay | Admitting: Podiatry

## 2014-03-23 ENCOUNTER — Ambulatory Visit (INDEPENDENT_AMBULATORY_CARE_PROVIDER_SITE_OTHER): Payer: Medicaid Other | Admitting: Podiatry

## 2014-03-23 VITALS — Ht 60.0 in | Wt 200.0 lb

## 2014-03-23 DIAGNOSIS — M79606 Pain in leg, unspecified: Secondary | ICD-10-CM | POA: Insufficient documentation

## 2014-03-23 DIAGNOSIS — I739 Peripheral vascular disease, unspecified: Secondary | ICD-10-CM

## 2014-03-23 MED ORDER — PENTOXIFYLLINE ER 400 MG PO TBCR: 400.0000 mg | EXTENDED_RELEASE_TABLET | Freq: Three times a day (TID) | ORAL | Status: DC

## 2014-03-23 NOTE — Progress Notes (Signed)
Subjective: 43 year old female presents complaining of bilateral foot pain. Stated that she also has nerve pain and takes 1200 mg Neurontin.  Been on Neurontin for years. Increased from 600 to 1200 for the last two ears.  Feet are aching inside x 6 months and can not walk due to pain. Problem is getting worse.  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   Review of Systems - General ROS: positive for  - chills, hot flashes, sleep disturbance and Being cold for the last few days. 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 Poor vision on left.  ENT ROS: negative Allergy and Immunology ROS: negative Endocrine ROS: negative Breast ROS: negative for breast lumps Respiratory ROS: Just getting pneumonia.  Cardiovascular ROS: Has over loading lower valve and takes medication. Gastrointestinal ROS: Takes medication for Acid reflux. Genito-Urinary ROS: no dysuria, trouble voiding, or hematuria Musculoskeletal ROS: Pain on left hip joint for a month, off and on.  Neurological ROS: Burning fingers if misses her Neurontin.  Dermatological ROS: Has lost multiple nails from previous digital gangrene.    Objective: Vascular: Pedal pulses are palpable on both feet. Temperature of digits are cold.  Purple discolored skin at distal end of 2 - 4 bilateral.  Capillary filling times has increased.  Orthopedic: High arched cavus foot bilateral. No gross deformity bilateral. Dermatologic: Missing digital nails on 1st and 4th right, 2nd through 4th left.  Neurologic: Decreased sensory response to Monofilament sensory testing. Normal vibratory sensations.   Assessment: Possible Micro angiopathy. Peripheral arterial disease.   Plan: Reviewed findings and available options. Trental prescribed.  Return in one month for evaluation.

## 2014-03-23 NOTE — Patient Instructions (Signed)
Seen for pain on both feet. Diagnosis made as possible Micro angiopathy, Peripheral vascular disease.  Trental prescribed. Take three times a day. Return in one month.

## 2014-04-09 ENCOUNTER — Other Ambulatory Visit: Payer: Self-pay | Admitting: Neurology

## 2014-04-27 ENCOUNTER — Encounter: Payer: Self-pay | Admitting: Podiatry

## 2014-04-27 ENCOUNTER — Ambulatory Visit (INDEPENDENT_AMBULATORY_CARE_PROVIDER_SITE_OTHER): Payer: Medicaid Other | Admitting: Podiatry

## 2014-04-27 DIAGNOSIS — M7742 Metatarsalgia, left foot: Secondary | ICD-10-CM

## 2014-04-27 DIAGNOSIS — M722 Plantar fascial fibromatosis: Secondary | ICD-10-CM

## 2014-04-27 DIAGNOSIS — M21969 Unspecified acquired deformity of unspecified lower leg: Secondary | ICD-10-CM

## 2014-04-27 NOTE — Progress Notes (Signed)
Subjective: She was having two kidney stones and they have passed, followed with blood in urine. She stopped taking Trental a week ago. Stated that she had no blood in urine this morning. Still taking Aspirin and Xeralto.  Today patient complaining of pain in arch and heel right, lateral ankle and ball of left foot.  Right foot been hurting off and on for at least over a year.  Neurontin helps on toe pain but not on arch and heel pain.  She is having difficulty wearing shoes due to pain. Used to walk for 20-25 minutes without problem till this pain started.  Objective: Pedal pulses are palpable on both feet. Mild purple colored distal end of lesser digits R>L.  Orthopedic: High arched cavus foot bilateral. No gross deformity bilateral. Limited first MPJ dorsiflexion L>R. Dermatologic: Missing digital nails on 1st and 4th right, 2nd through 4th left.  Neurologic: Decreased sensory response to Monofilament sensory testing. Normal vibratory sensations.  Radiographic examination reveal high arch cavus foot with elevated first ray bilateral L>R. Positive varus deformity of the left foot.  Assessment: Metatarsal deformity bilateral, elevated first ray bilateral. Plantar fasciitis right. Lesser metatarsalgia left. Lateral ankle pain left. STJ compensatory hyperpronation bilateral. Hallux limitus left > right.   Plan: Reviewed findings and available options. Reviewed benefits of orthotics, custom or OTC. May require Cotton osteotomy with graft.  Patient will return and pick up OTC orthotics.

## 2014-04-27 NOTE — Patient Instructions (Signed)
Seen for bilateral foot pain.  Noted in X-ray displaced first Metatarsal bone bilateral. May benefit from orthotics, custom or OTC.

## 2014-05-11 ENCOUNTER — Ambulatory Visit: Payer: Medicaid Other | Admitting: Podiatry

## 2014-05-12 ENCOUNTER — Ambulatory Visit (INDEPENDENT_AMBULATORY_CARE_PROVIDER_SITE_OTHER): Payer: Medicaid Other | Admitting: Podiatry

## 2014-05-12 ENCOUNTER — Encounter: Payer: Self-pay | Admitting: Podiatry

## 2014-05-12 VITALS — BP 113/71 | HR 64

## 2014-05-12 DIAGNOSIS — M21969 Unspecified acquired deformity of unspecified lower leg: Secondary | ICD-10-CM

## 2014-05-12 DIAGNOSIS — M722 Plantar fascial fibromatosis: Secondary | ICD-10-CM

## 2014-05-12 MED ORDER — OXYCODONE-ACETAMINOPHEN 7.5-325 MG PO TABS: 1.0000 | ORAL_TABLET | Freq: Four times a day (QID) | ORAL | Status: DC | PRN

## 2014-05-12 MED ORDER — NAPROXEN-ESOMEPRAZOLE 500-20 MG PO TBEC: 1.0000 | DELAYED_RELEASE_TABLET | Freq: Two times a day (BID) | ORAL | Status: DC

## 2014-05-12 NOTE — Patient Instructions (Signed)
Seen for pain in both feet. May take Naproxen for moderate pain. May take percocet for severe pain one a day.

## 2014-05-12 NOTE — Progress Notes (Signed)
Subjective: Orthotics are not helping.  Left foot pain is on lateral aspect of left heel and mid foot, and radiates to hip.  On right side, pain is at instep, ball and lateral column area. On feet a lot taking care of 44 year old boy and 44 year old son.  She had recent history of passing two kidney stones and they have passed, followed with blood in urine.  She is not on Trental. She was on Percocet and Dilaudid for pain in past.   Right foot been hurting off and on for at least over a year.  Neurontin helps on toe pain but not on arch and heel pain.  She is having difficulty wearing shoes due to pain. Used to walk for 20-25 minutes without problem till this pain started.  Objective: Pedal pulses are palpable on both feet. Mild purple colored distal end of lesser digits R>L.  Orthopedic: High arched cavus foot bilateral. No gross deformity bilateral. Limited first MPJ dorsiflexion L>R. Dermatologic: Missing digital nails on 1st and 4th right, 2nd through 4th left.  Neurologic: Decreased sensory response to Monofilament sensory testing. Normal vibratory sensations.  Radiographic examination reveal high arch cavus foot with elevated first ray bilateral L>R. Positive varus deformity of the left foot.  Assessment: Metatarsal deformity bilateral, elevated first ray bilateral. Plantar fasciitis right. Lesser metatarsalgia left. Lateral ankle pain left. STJ compensatory hyperpronation bilateral. Hallux limitus left > right.   Plan: Reviewed findings and available options. We will manage with NSAIA and pain medication for the next 2 months and may do surgery in March when weather gets warm. She has cold toes in winter.

## 2014-06-04 ENCOUNTER — Ambulatory Visit: Payer: Medicaid Other | Admitting: Neurology

## 2014-06-07 ENCOUNTER — Ambulatory Visit: Payer: Medicaid Other | Admitting: Nurse Practitioner

## 2014-06-08 ENCOUNTER — Ambulatory Visit: Payer: Medicaid Other | Admitting: Neurology

## 2014-06-08 ENCOUNTER — Telehealth: Payer: Self-pay | Admitting: *Deleted

## 2014-06-08 NOTE — Telephone Encounter (Signed)
No show

## 2014-06-09 ENCOUNTER — Encounter: Payer: Self-pay | Admitting: Neurology

## 2014-06-11 ENCOUNTER — Encounter: Payer: Self-pay | Admitting: Neurology

## 2014-06-11 ENCOUNTER — Ambulatory Visit (INDEPENDENT_AMBULATORY_CARE_PROVIDER_SITE_OTHER): Payer: Medicaid Other | Admitting: Neurology

## 2014-06-11 VITALS — BP 112/79 | HR 75 | Ht 60.0 in | Wt 204.0 lb

## 2014-06-11 DIAGNOSIS — G43009 Migraine without aura, not intractable, without status migrainosus: Secondary | ICD-10-CM | POA: Insufficient documentation

## 2014-06-11 MED ORDER — RIZATRIPTAN BENZOATE 10 MG PO TABS: ORAL_TABLET | ORAL | Status: DC

## 2014-06-11 NOTE — Progress Notes (Signed)
[facility]  PATIENT: [name] DOB: 01-13-1971   REASON FOR VISIT: Followup for headache   HISTORY OF PRESENT ILLNESS:[name], 44 year old female returns for follow up of her chronic migraine. She was last seen in August 2015 by [name].   HISTORY:. She was patient of Dr. Sandria Manly, I saw her previously for Botox injection as migraine prevention.  She has past medical history of peptic ulcer, kidney stone, bipolar, history of polysubstance abuse,  She presented with chronic migraine headaches since age 31, progressively worse, her typical migraine are lateralized retro-orbital area severe pounding headache, with associated photo/phono/osmophobia, nausea vomitting, can last hours to days,  Over the years, she has tried and failed different preventive medication such as Topamax, Depakote, Lyrica, she is currently taking gabapentin,  Over the past few years, she complains of daily headaches, has been taking over-the-counter Aleve migraine, Excedrin Migraine, ice packs, Goody Powder on a daily basis, for a while she was taking Maxalt, Stadol nasal spray, but is no longer on those medications, Maxalt does help her headache, sumatriptan caused heart palpitations  She reported history of Botox injection in 2004 once, which has been very helpful, lasting for 2 and half years, for insurance reasons, also worry about the injection pain, she only received once.  She had a few sets of Botox injection in 2013, most recent from August 2013, which only help her mild to moderately, she was also under the care of pain management, is receiving epidural injection for neck pain, she used  stadol as rescue therapy for her headaches  She was admitted to the hospital in January 2014 for severe pneumonia, she also developed kidney failure, requiring hemodialysis, she has bilateral fingertips, toes gangrene, require right index finger amputation, has prolonged ICU stay, tracheostomy, She was  discharged to nursing home initially, she is now back home, she complains of constant daily headaches, starting from upper and nuchal area, spreading forward, bilateral temporal, frontal region, pressure, moderate, with associated light noise sensitivity, Maxalt provided moderate help, Imitrex SQ was very helpful. she is also taking gabapentin 300 mg 4 times a day,   UPDATE Feb 19th 2016: Last visit was with [name] in August 2015, she is taking Imitrex Sq prn, about 8/month, she has is also taking Maxalt prn, used 10 tabs each month, Zofran as needed She still has migraines, 2-3/week, sometimes she woke up with headaches, headache can last half day, 2 couple days, with associated light noise sensitivity, nauseous,  REVIEW OF SYSTEMS: Full 14 system review of systems performed and notable only for those listed, all others are neg: As above    ALLERGIES: Allergies  Allergen Reactions  . Ciprofloxacin Hives    REACTION: Hives  . Diphenhydramine     Other reaction(s): Hallucinations  . Diphenhydramine Hcl Other (See Comments)  . Diphenhydramine Hcl     Other reaction(s): DELIRIUM  . Divalproex Sodium Swelling    Other reaction(s): Other swelling  . Heparin Other (See Comments)    Other reaction(s): HYPERTENSION,SWELLING Raises BP and swells  . Ketorolac     Other reaction(s): URINARY RETENTION  . Ketorolac Tromethamine Other (See Comments)    Bladder Retention Bladder retention Bladder retintion  . Nefazodone   . Other     Other reaction(s): Other (See Comments) Per Lastword Other reaction(s): Agitation  . Sertraline   . Tramadol Other (See Comments)    Bladder retention  . Trazodone   . Trazodone And Nefazodone  Other reaction(s): Other hyperactive  Not agree with herTourette's   . Valproic Acid Swelling    Other reaction(s): SWELLING Other reaction(s): Facial Edema (intolerance)  . Sumatriptan Palpitations    REACTION: Palpitations    HOME  MEDICATIONS: Outpatient Prescriptions Prior to Visit  Medication Sig Dispense Refill  . albuterol (PROVENTIL HFA;VENTOLIN HFA) 108 (90 BASE) MCG/ACT inhaler Inhale 2 puffs into the lungs every 6 (six) hours as needed. For shortness breath    . albuterol (PROVENTIL) (2.5 MG/3ML) 0.083% nebulizer solution 2.5 mg.    . amLODipine (NORVASC) 10 MG tablet Take 10 mg by mouth.    . Biotin 1 MG CAPS Take 10,000 mg by mouth.    . chlorproMAZINE (THORAZINE) 50 MG tablet     . cholecalciferol (VITAMIN D) 1000 UNITS tablet Take 400 Units by mouth.    . cyclobenzaprine (FLEXERIL) 10 MG tablet Take 10 mg by mouth.    . diazepam (VALIUM) 10 MG tablet Take 5 mg by mouth.    . diazepam (VALIUM) 5 MG tablet Take 5 mg by mouth 3 (three) times daily.    Marland Kitchen docusate sodium (COLACE) 100 MG capsule Take 200 mg by mouth.    . esomeprazole (NEXIUM) 40 MG capsule Take 40 mg by mouth 2 (two) times daily.    . ferrous fumarate (HEMOCYTE - 106 MG FE) 325 (106 FE) MG TABS Take 2 tablets by mouth daily.     . ferrous sulfate 325 (65 FE) MG tablet Take 1 tablet by mouth.    Marland Kitchen FLUoxetine (PROZAC) 40 MG capsule Take 40 mg by mouth daily.     . fluPHENAZine (PROLIXIN) 2.5 MG tablet     . gabapentin (NEURONTIN) 300 MG capsule Take 300 mg by mouth 4 (four) times daily.     . hydrochlorothiazide (HYDRODIURIL) 25 MG tablet 1 mg.    . HYDROcodone-homatropine (HYCODAN) 5-1.5 MG/5ML syrup Take by mouth.    . iron polysaccharides (NU-IRON) 150 MG capsule Frequency:   Dosage:0   MG  Instructions:  Note:TAKE 1 CAPSULE BY MOUTH TWICE DAILY    . levothyroxine (SYNTHROID, LEVOTHROID) 25 MCG tablet Take 25 mcg by mouth.    . lithium carbonate 300 MG capsule     . loperamide (IMODIUM) 2 MG capsule Take 2 mg by mouth 4 (four) times daily as needed. For diarrhea    . Lorcaserin HCl (BELVIQ) 10 MG TABS     . mometasone (NASONEX) 50 MCG/ACT nasal spray Place 2 sprays into the nose.    . Multiple Vitamins-Minerals (THERA-M) TABS Take 1 tablet  by mouth.    Marland Kitchen omeprazole (PRILOSEC) 20 MG capsule     . oxyCODONE-acetaminophen (PERCOCET) 7.5-325 MG per tablet Take 1 tablet by mouth every 6 (six) hours as needed for pain. 60 tablet 0  . pentoxifylline (TRENTAL) 400 MG CR tablet Take 1 tablet (400 mg total) by mouth 3 (three) times daily with meals. 90 tablet 3  . polyethylene glycol (MIRALAX / GLYCOLAX) packet Take 17 g by mouth as needed.    . potassium chloride (K-DUR) 10 MEQ tablet Take 10 mEq by mouth.    . promethazine (PHENERGAN) 25 MG/ML injection Inject 25 mg into the muscle.    . pseudoephedrine-acetaminophen (TYLENOL SINUS) 30-500 MG TABS Take 2 tablets by mouth every 4 (four) hours as needed. Sinus pressure    . ranitidine (ZANTAC) 150 MG tablet Take 150 mg by mouth at bedtime.    . rivaroxaban (XARELTO) 20 MG TABS tablet Take 20  mg by mouth.    . rizatriptan (MAXALT) 10 MG tablet TAKE 1 TABLET (10 MG TOTAL) BY MOUTH AS NEEDED FOR MIGRAINE. MAY REPEAT IN 2 HOURS IF NEEDED 10 tablet 6  . SUMAtriptan (IMITREX) 6 MG/0.5ML SOLN injection 6 mg.    . SUMAtriptan 6 MG/0.5ML SOAJ INJECT 6 MG INTO THE SKIN AS NEEDED. 6 Syringe 3  . Tapentadol HCl (NUCYNTA ER) 100 MG TB12     . tiZANidine (ZANAFLEX) 2 MG tablet TAKE 1 TABLET BY MOUTH 3 TIMES A DAY 90 tablet 5  . traZODone (DESYREL) 50 MG tablet Take 50 mg by mouth.    Marland Kitchen. alprazolam (XANAX) 2 MG tablet Take 2 mg by mouth.    Marland Kitchen. amLODipine (NORVASC) 2.5 MG tablet 1 mg.    . aspirin 81 MG EC tablet Take 81 mg by mouth.    . baclofen (LIORESAL) 10 MG tablet Take 10 mg by mouth every 8 (eight) hours as needed. For muscle spasms    . butorphanol (STADOL) 10 MG/ML nasal spray     . chlorproMAZINE (THORAZINE) 100 MG tablet Take 1 tablet (100 mg total) by mouth 2 (two) times daily. 6 tablet 0  . ciprofloxacin (CIPRO) 500 MG tablet Take 500 mg by mouth.    . clindamycin-benzoyl peroxide (BENZACLIN) gel     . fluconazole (DIFLUCAN) 100 MG tablet One by mouth once may repeat one po once  after 3  days if still symptomatic    . FLUoxetine (PROZAC) 20 MG tablet Take 20 mg by mouth.    . methylPREDNISolone (MEDROL DOSEPAK) 4 MG tablet follow package directions 21 tablet 0  . Naproxen-Esomeprazole 500-20 MG TBEC Take 1 tablet by mouth 2 (two) times daily. TAKE 1 TABLET BY MOUTH TWICE DAILY WITH MEAL AS NEEDED FOR PAIN 60 tablet 1  . nitroGLYCERIN (NITROSTAT) 0.3 MG SL tablet Place 0.3 mg under the tongue as needed.    . Pimozide 1 MG TABS Take 0.5 mg by mouth.    . predniSONE (DELTASONE) 5 MG tablet Begin taking 6 tablets daily, taper by one tablet daily until off the medication. 21 tablet 0  . zolpidem (AMBIEN) 5 MG tablet Take 5 mg by mouth at bedtime as needed. For insomnia     Facility-Administered Medications Prior to Visit  Medication Dose Route Frequency Provider Last Rate Last Dose  . valproate (DEPACON) 1,000 mg in sodium chloride 0.9 % 100 mL IVPB  1,000 mg Intravenous Continuous Levert FeinsteinYijun Baby Stairs, MD   Stopped at 03/18/13 0850    PAST MEDICAL HISTORY: Past Medical History  Diagnosis Date  . Renal insufficiency   . Asthma   . Coronary artery disease   . COPD (chronic obstructive pulmonary disease)   . Diabetes mellitus   . HEMORRHOIDS, INTERNAL   . ANEMIA OF CHRONIC DISEASE   . Tourette's disorder   . INSOMNIA, CHRONIC   . DEPRESSION   . 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 ACID REFLUX DISEASE   . Irritable bowel syndrome   . MENOPAUSE, SURGICAL   . DEGENERATIVE DISC DISEASE   . BACK PAIN   . Renal colic   . ALCOHOL ABUSE, HX OF   . PERSONAL HX OF METHICILLIN RESIST STAPH AUREUS   . DRUG ABUSE, HX OF   . BLADDER SUSPENSION, HX OF   . CANDIDIASIS   . PNEUMONIA, COMMUNITY ACQUIRED, PNEUMOCOCCAL   . Nephrolithiasis   . PUD (peptic ulcer disease)   . Migraine     PAST SURGICAL HISTORY:  Past Surgical History  Procedure Laterality Date  . Abdominal hysterectomy    . Eye surgery    . Bladder tack      FAMILY HISTORY: Family History  Problem Relation Age of Onset   . Heart disease      No family history    SOCIAL HISTORY: History   Social History  . Marital Status: Legally Separated    Spouse Name: N/A  . Number of Children: 2  . Years of Education: N/A   Occupational History  .      Unemployed   Social History Main Topics  . Smoking status: Current Every Day Smoker -- 0.01 packs/day    Types: Cigarettes  . Smokeless tobacco: Never Used  . Alcohol Use: 0.0 oz/week    0 Standard drinks or equivalent per week     Comment: occasionally  . Drug Use: No     Comment: Previous use of marijuana and cocaine  . Sexual Activity: Not on file   Other Topics Concern  . Not on file   Social History Narrative   Patient is unemployed. Graduated from equivalency degree.   Divorced.     PHYSICAL EXAM  Filed Vitals:   06/11/14 1341  BP: 112/79  Pulse: 75  Height: 5' (1.524 m)  Weight: 204 lb (92.534 kg)   Body mass index is 39.84 kg/(m^2). Generalized: Well developed, in no acute distress  Head: normocephalic and atraumatic,. Oropharynx benign  Neck: Supple  Cardiac: Regular rate rhythm  Musculoskeletal: No deformity  Neurological examination  Mentation: Alert oriented to time, place, history taking. Follows all commands speech and language fluent  Cranial nerve II-XII: .Pupils were equal round reactive to light extraocular movements were full, visual field were full on confrontational test. Facial sensation and strength were normal. hearing was intact to finger rubbing bilaterally. Uvula tongue midline. head turning and shoulder shrug were normal and symmetric.Tongue protrusion into cheek strength was normal.  Motor: normal bulk and tone, full strength in the BUE, BLE, No focal weakness  Sensory: normal and symmetric to light touch, pinprick, and vibration  Coordination: finger-nose-finger, heel-to-shin bilaterally, no dysmetria  Reflexes: Brachioradialis 2/2, biceps 2/2, triceps 2/2, patellar 2/2, Achilles 2/2, plantar responses were  flexor bilaterally.  Gait and Station: Rising up from seated position without assistance, normal stance, moderate stride, good arm swing, smooth turning, able to perform tiptoe, and heel walking without difficulty. Tandem gait is steady  DIAGNOSTIC DATA (LABS, IMAGING, TESTING) -ASSESSMENT AND PLAN  44 y.o. year old female  has a past medical history of chronic migraines, failed multiple preventative medications in the past, including Topamax, Depakote, Lyrica, Neurontin, continue has 2-3 headaches each week, lasting few hours to days, taking multiple triptan treatment, including Maxalt, Imitrex as rescue treatment,  1, continue current medication, 2, advised her carefully document her headache frequency, and triptan use, 3, if she continue have frequent headaches may consider Botox injection as migraine prevention   Return in about 3 months (around 09/09/2014).  Levert Feinstein, M.D. Ph.D.  [facility] [address] [place], [place] [address] Phone: [phone] Fax:      [phone]

## 2014-07-12 ENCOUNTER — Encounter: Payer: Self-pay | Admitting: Podiatry

## 2014-07-12 ENCOUNTER — Ambulatory Visit (INDEPENDENT_AMBULATORY_CARE_PROVIDER_SITE_OTHER): Payer: Medicaid Other | Admitting: Podiatry

## 2014-07-12 VITALS — BP 121/83 | HR 84

## 2014-07-12 DIAGNOSIS — M21961 Unspecified acquired deformity of right lower leg: Secondary | ICD-10-CM

## 2014-07-12 DIAGNOSIS — M722 Plantar fascial fibromatosis: Secondary | ICD-10-CM

## 2014-07-12 MED ORDER — OXYCODONE-ACETAMINOPHEN 7.5-325 MG PO TABS: 1.0000 | ORAL_TABLET | Freq: Four times a day (QID) | ORAL | Status: DC | PRN

## 2014-07-12 NOTE — Patient Instructions (Signed)
Seen for pain in both feet. No change since the last visit. Pain pills help. Need one a day. Prescription renewed. Return after [place] trip to discuss surgery if needed. May do Cotton osteotomy with bone graft.

## 2014-07-12 NOTE — Progress Notes (Signed)
Subjective: Stated that orthotics are not helping. Patient wants to discuss surgical options. [facility] [place]   Objective: Pedal pulses are palpable on both feet. Mild purple colored distal end of lesser digits R>L.  Still having poor blood flow on digits. They blanch when pressed with slow capillary filling time.  Orthopedic: High arched cavus foot bilateral. No gross deformity bilateral. Limited first MPJ dorsiflexion L>R. Dermatologic: Missing digital nails on 1st and 4th right, 2nd through 4th left.  Neurologic: Decreased sensory response to Monofilament sensory testing. Normal vibratory sensations.   Assessment: Metatarsal deformity bilateral, elevated first ray bilateral. Plantar fasciitis right. Lesser metatarsalgia left. Lateral ankle pain left. STJ compensatory hyperpronation bilateral. Hallux limitus left > right.   Plan: Reviewed findings and available options. We will continue with NSAIA and pain medication. Still taking one pain pill a day for pain.  Patient will return after her trip to discuss surgery in April.

## 2014-08-03 ENCOUNTER — Ambulatory Visit: Payer: Medicaid Other | Admitting: Podiatry

## 2014-08-10 ENCOUNTER — Ambulatory Visit: Payer: Medicaid Other | Admitting: Podiatry

## 2014-08-11 ENCOUNTER — Encounter: Payer: Self-pay | Admitting: Gastroenterology

## 2014-09-02 ENCOUNTER — Telehealth: Payer: Self-pay | Admitting: *Deleted

## 2014-09-02 NOTE — Telephone Encounter (Signed)
Pt called and stated she could not make pre-op appt due to the illness. Her pain med is running out on 09/11/14 and requested a refill.

## 2014-09-07 MED ORDER — OXYCODONE-ACETAMINOPHEN 7.5-325 MG PO TABS: 1.0000 | ORAL_TABLET | Freq: Four times a day (QID) | ORAL | Status: DC | PRN

## 2014-09-21 ENCOUNTER — Ambulatory Visit: Payer: Medicaid Other | Admitting: Neurology

## 2014-09-21 ENCOUNTER — Telehealth: Payer: Self-pay | Admitting: *Deleted

## 2014-09-21 NOTE — Telephone Encounter (Signed)
No showed appt today. 

## 2014-10-21 ENCOUNTER — Encounter: Payer: Self-pay | Admitting: Neurology

## 2014-10-21 ENCOUNTER — Ambulatory Visit (INDEPENDENT_AMBULATORY_CARE_PROVIDER_SITE_OTHER): Payer: Medicaid Other | Admitting: Neurology

## 2014-10-21 VITALS — BP 142/100 | HR 95 | Ht 60.0 in | Wt 210.0 lb

## 2014-10-21 DIAGNOSIS — G43009 Migraine without aura, not intractable, without status migrainosus: Secondary | ICD-10-CM

## 2014-10-21 MED ORDER — PROPRANOLOL HCL ER 60 MG PO CP24: 60.0000 mg | ORAL_CAPSULE | Freq: Every day | ORAL | Status: DC

## 2014-10-21 NOTE — Progress Notes (Signed)
Chief Complaint  Patient presents with  . Migraine    Reports improvement in her migraines.  She was not able to keep a log because she has been suffering with pneumonia.  Says she has been using less triptans each month.     [facility]  PATIENT: [name] DOB: October 28, 1970   REASON FOR VISIT: Followup for headache   HISTORY OF PRESENT ILLNESS:[name], 44 year old female returns for follow up of her chronic migraine. She was last seen in August 2015 by [name].   HISTORY:. She was patient of Dr. Sandria Manly, I saw her previously for Botox injection as migraine prevention.  She has past medical history of peptic ulcer, kidney stone, bipolar, history of polysubstance abuse,  She presented with chronic migraine headaches since age 57, progressively worse, her typical migraine are lateralized retro-orbital area severe pounding headache, with associated photo/phono/osmophobia, nausea vomitting, can last hours to days,  Over the years, she has tried and failed different preventive medication such as Topamax, Depakote, Lyrica, Nortriptyline, worsening tourette, she is currently taking gabapentin,  Over the past few years, she complains of daily headaches, has been taking over-the-counter Aleve migraine, Excedrin Migraine, ice packs, Goody Powder on a daily basis, for a while she was taking Maxalt, Stadol nasal spray, but is no longer on those medications, Maxalt does help her headache, sumatriptan caused heart palpitations  She reported history of Botox injection in 2004 once, which has been very helpful, lasting for 2 and half years, for insurance reasons, also worry about the injection pain, she only received once.  She had a few sets of Botox injection in 2013, most recent from August 2013, which only help her mild to moderately, she was also under the care of pain management, is receiving epidural injection for neck pain, she used  stadol as rescue therapy for her headaches    She was admitted to the hospital in January 2014 for severe pneumonia, she also developed kidney failure, requiring hemodialysis, she has bilateral fingertips, toes gangrene, require right index finger amputation, has prolonged ICU stay, tracheostomy, She was discharged to nursing home initially, she is now back home, she complains of constant daily headaches, starting from upper and nuchal area, spreading forward, bilateral temporal, frontal region, pressure, moderate, with associated light noise sensitivity, Maxalt provided moderate help, Imitrex SQ was very helpful. she is also taking gabapentin 300 mg 4 times a day,   UPDATE Feb 19th 2016: Last visit was with [name] in August 2015, she is taking Imitrex Sq prn, about 8/month, she has is also taking Maxalt prn, used 10 tabs each month, Zofran as needed She still has migraines, 2-3/week, sometimes she woke up with headaches, headache can last half day, 2 couple days, with associated light noise sensitivity, nauseous,  UPDATE October 21 2014: She had one episode of severe headaches, lasted for 7 days, eventually responded to repeated Imitrex injection , overall she has less headaches, but continue have mild/moderate daily early-morning pressure headaches,   She was admitted to hospital in April 2016 for pneumonia, recovered well, was put on home oxygen  She also had sleep study recently was diagnosed with obstructive sleep apnea, is going to get CPAP machine soon. For headache prevention, she has tried nortriptyline, worsening Tourette's syndrome, also tried and failed Depakote, Lyrica, Topamax.   REVIEW OF SYSTEMS: Full 14 system review of systems performed and notable only for those listed, all others are neg: As above  ALLERGIES: Allergies  Allergen Reactions  .  Ciprofloxacin Hives    REACTION: Hives  . Diphenhydramine     Other reaction(s): Hallucinations  . Diphenhydramine Hcl Other (See Comments)  . Diphenhydramine Hcl     Other  reaction(s): DELIRIUM  . Divalproex Sodium Swelling    Other reaction(s): Other swelling  . Heparin Other (See Comments)    Other reaction(s): HYPERTENSION,SWELLING Raises BP and swells  . Ketorolac     Other reaction(s): URINARY RETENTION  . Ketorolac Tromethamine Other (See Comments)    Bladder Retention Bladder retention Bladder retintion  . Nefazodone   . Other     Other reaction(s): Other (See Comments) Per Lastword Other reaction(s): Agitation  . Sertraline   . Tramadol Other (See Comments)    Bladder retention  . Trazodone   . Trazodone And Nefazodone     Other reaction(s): Other hyperactive  Not agree with herTourette's   . Valproic Acid Swelling    Other reaction(s): SWELLING Other reaction(s): Facial Edema (intolerance)  . Sumatriptan Palpitations    REACTION: Palpitations    HOME MEDICATIONS: Outpatient Prescriptions Prior to Visit  Medication Sig Dispense Refill  . albuterol (PROVENTIL HFA;VENTOLIN HFA) 108 (90 BASE) MCG/ACT inhaler Inhale 2 puffs into the lungs every 6 (six) hours as needed. For shortness breath    . albuterol (PROVENTIL) (2.5 MG/3ML) 0.083% nebulizer solution 2.5 mg.    . amLODipine (NORVASC) 10 MG tablet Take 10 mg by mouth.    . Biotin 1 MG CAPS Take 10,000 mg by mouth.    . chlorproMAZINE (THORAZINE) 50 MG tablet     . cholecalciferol (VITAMIN D) 1000 UNITS tablet Take 400 Units by mouth.    . cyclobenzaprine (FLEXERIL) 10 MG tablet Take 10 mg by mouth.    . diazepam (VALIUM) 5 MG tablet Take 5 mg by mouth 3 (three) times daily.    Marland Kitchen docusate sodium (COLACE) 100 MG capsule Take 200 mg by mouth.    . esomeprazole (NEXIUM) 40 MG capsule Take 40 mg by mouth 2 (two) times daily.    . ferrous fumarate (HEMOCYTE - 106 MG FE) 325 (106 FE) MG TABS Take 2 tablets by mouth daily.     . ferrous sulfate 325 (65 FE) MG tablet Take 1 tablet by mouth.    Marland Kitchen FLUoxetine (PROZAC) 40 MG capsule Take 40 mg by mouth daily.     . fluPHENAZine (PROLIXIN) 2.5  MG tablet     . gabapentin (NEURONTIN) 300 MG capsule Take 300 mg by mouth 4 (four) times daily.     . hydrochlorothiazide (HYDRODIURIL) 25 MG tablet 1 mg.    . iron polysaccharides (NU-IRON) 150 MG capsule Frequency:   Dosage:0   MG  Instructions:  Note:TAKE 1 CAPSULE BY MOUTH TWICE DAILY    . levothyroxine (SYNTHROID, LEVOTHROID) 25 MCG tablet Take 25 mcg by mouth.    . lithium carbonate 300 MG capsule     . loperamide (IMODIUM) 2 MG capsule Take 2 mg by mouth 4 (four) times daily as needed. For diarrhea    . mometasone (NASONEX) 50 MCG/ACT nasal spray Place 2 sprays into the nose.    . Multiple Vitamins-Minerals (THERA-M) TABS Take 1 tablet by mouth.    Marland Kitchen omeprazole (PRILOSEC) 20 MG capsule     . oxyCODONE-acetaminophen (PERCOCET) 7.5-325 MG per tablet Take 1 tablet by mouth every 6 (six) hours as needed. 60 tablet 0  . pentoxifylline (TRENTAL) 400 MG CR tablet Take 1 tablet (400 mg total) by mouth 3 (three) times daily with  meals. 90 tablet 3  . polyethylene glycol (MIRALAX / GLYCOLAX) packet Take 17 g by mouth as needed.    . potassium chloride (K-DUR) 10 MEQ tablet Take 10 mEq by mouth.    . promethazine (PHENERGAN) 25 MG/ML injection Inject 25 mg into the muscle.    . pseudoephedrine-acetaminophen (TYLENOL SINUS) 30-500 MG TABS Take 2 tablets by mouth every 4 (four) hours as needed. Sinus pressure    . ranitidine (ZANTAC) 150 MG tablet Take 150 mg by mouth at bedtime.    . rizatriptan (MAXALT) 10 MG tablet TAKE 1 TABLET (10 MG TOTAL) BY MOUTH AS NEEDED FOR MIGRAINE. MAY REPEAT IN 2 HOURS IF NEEDED 10 tablet 11  . SUMAtriptan 6 MG/0.5ML SOAJ INJECT 6 MG INTO THE SKIN AS NEEDED. 6 Syringe 3  . Tapentadol HCl (NUCYNTA ER) 100 MG TB12     . SUMAtriptan (IMITREX) 6 MG/0.5ML SOLN injection 6 mg.    . HYDROcodone-homatropine (HYCODAN) 5-1.5 MG/5ML syrup Take by mouth.    . Lorcaserin HCl (BELVIQ) 10 MG TABS     . traZODone (DESYREL) 50 MG tablet Take 50 mg by mouth.     Facility-Administered  Medications Prior to Visit  Medication Dose Route Frequency Provider Last Rate Last Dose  . valproate (DEPACON) 1,000 mg in sodium chloride 0.9 % 100 mL IVPB  1,000 mg Intravenous Continuous Levert FeinsteinYijun Megon Kalina, MD   Stopped at 03/18/13 0850    PAST MEDICAL HISTORY: Past Medical History  Diagnosis Date  . Renal insufficiency   . Asthma   . Coronary artery disease   . COPD (chronic obstructive pulmonary disease)   . Diabetes mellitus   . HEMORRHOIDS, INTERNAL   . ANEMIA OF CHRONIC DISEASE   . Tourette's disorder   . INSOMNIA, CHRONIC   . DEPRESSION   . 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 ACID REFLUX DISEASE   . Irritable bowel syndrome   . MENOPAUSE, SURGICAL   . DEGENERATIVE DISC DISEASE   . BACK PAIN   . Renal colic   . ALCOHOL ABUSE, HX OF   . PERSONAL HX OF METHICILLIN RESIST STAPH AUREUS   . DRUG ABUSE, HX OF   . BLADDER SUSPENSION, HX OF   . CANDIDIASIS   . PNEUMONIA, COMMUNITY ACQUIRED, PNEUMOCOCCAL   . Nephrolithiasis   . PUD (peptic ulcer disease)   . Migraine     PAST SURGICAL HISTORY: Past Surgical History  Procedure Laterality Date  . Abdominal hysterectomy    . Eye surgery    . Bladder tack      FAMILY HISTORY: Family History  Problem Relation Age of Onset  . Heart disease      No family history    SOCIAL HISTORY: History   Social History  . Marital Status: Legally Separated    Spouse Name: N/A  . Number of Children: 2  . Years of Education: N/A   Occupational History  .      Unemployed   Social History Main Topics  . Smoking status: Current Every Day Smoker -- 0.01 packs/day    Types: Cigarettes  . Smokeless tobacco: Never Used  . Alcohol Use: 0.0 oz/week    0 Standard drinks or equivalent per week     Comment: occasionally  . Drug Use: No     Comment: Previous use of marijuana and cocaine  . Sexual Activity: Not on file   Other Topics Concern  . Not on file   Social History Narrative   Patient is  unemployed. Graduated from  equivalency degree.   Divorced.     PHYSICAL EXAM  Filed Vitals:   10/21/14 0918  BP: 142/100  Pulse: 95  Height: 5' (1.524 m)  Weight: 210 lb (95.255 kg)   Body mass index is 41.01 kg/(m^2).  PHYSICAL EXAMNIATION:  Gen: NAD, conversant, well nourised, obese, well groomed                     Cardiovascular: Regular rate rhythm, no peripheral edema, warm, nontender. Eyes: Conjunctivae clear without exudates or hemorrhage Neck: Supple, no carotid bruise. Pulmonary: Clear to auscultation bilaterally   NEUROLOGICAL EXAM:  MENTAL STATUS: Speech:    Speech is normal; fluent and spontaneous with normal comprehension.  Cognition:    The patient is oriented to person, place, and time;     recent and remote memory intact;     language fluent;     normal attention, concentration,     fund of knowledge.  CRANIAL NERVES: CN II: Visual fields are full to confrontation. Fundoscopic exam is normal with sharp discs and no vascular changes. Pupils were equal round reactive to light CN III, IV, VI: extraocular movement are normal. No ptosis. CN V: Facial sensation is intact to pinprick in all 3 divisions bilaterally. Corneal responses are intact.  CN VII: Face is symmetric with normal eye closure and smile. CN VIII: Hearing is normal to rubbing fingers CN IX, X: Palate elevates symmetrically. Phonation is normal. CN XI: Head turning and shoulder shrug are intact CN XII: Tongue is midline with normal movements and no atrophy.  MOTOR: There is no pronator drift of out-stretched arms. Muscle bulk and tone are normal. Muscle strength is normal.  REFLEXES: Reflexes are 2+ and symmetric at the biceps, triceps, knees, and ankles. Plantar responses are flexor.  SENSORY: Light touch, pinprick, position sense, and vibration sense are intact in fingers and toes.  COORDINATION: Rapid alternating movements and fine finger movements are intact. There is no dysmetria on finger-to-nose and  heel-knee-shin. There are no abnormal or extraneous movements.   GAIT/STANCE: Posture is normal. Gait is steady with normal steps, base, arm swing, and turning. Heel and toe walking are normal. Tandem gait is normal.  Romberg is absent.    DIAGNOSTIC DATA (LABS, IMAGING, TESTING) -ASSESSMENT AND PLAN  44 y.o. year old female  has a past medical history of chronic migraines, failed multiple preventative medications in the past, including Topamax, Depakote, Lyrica, Neurontin, nortriptyline, continue has 2-3 headaches each week, lasting few hours to days, taking multiple triptan treatment, including Maxalt, Imitrex as rescue treatment,  1, chronic migraine, still has frequent mild to moderate headaches, will add on Inderal 2, Obstructive sleep apnea, using CPAP machine might help her headaches as well  3, continue Maxalt as needed, Imitrex injection as rescue  4. return to clinic in 3 months   Levert Feinstein, M.D. Ph.D.  [facility] [address] [place], [place] [address] Phone: [phone] Fax:      [phone]

## 2014-11-09 ENCOUNTER — Other Ambulatory Visit: Payer: Self-pay | Admitting: *Deleted

## 2014-11-09 NOTE — Telephone Encounter (Signed)
Patient requests pain med refill 

## 2014-11-10 ENCOUNTER — Telehealth: Payer: Self-pay | Admitting: *Deleted

## 2014-11-10 NOTE — Telephone Encounter (Signed)
11/10/2014 Telephone call today from patient, she states she has developed blisters on her toes the past 2 months, she is currently taking pentoxifylline/ER 400 mg and she also request a Pain RX for Oxycodone, I told her she will have to make an appointment  Which she did for next Friday 11/19/2014

## 2014-11-19 ENCOUNTER — Ambulatory Visit (INDEPENDENT_AMBULATORY_CARE_PROVIDER_SITE_OTHER): Payer: Medicaid Other | Admitting: Podiatry

## 2014-11-19 ENCOUNTER — Encounter: Payer: Self-pay | Admitting: Podiatry

## 2014-11-19 VITALS — BP 109/77 | HR 59

## 2014-11-19 DIAGNOSIS — L97519 Non-pressure chronic ulcer of other part of right foot with unspecified severity: Secondary | ICD-10-CM | POA: Diagnosis not present

## 2014-11-19 DIAGNOSIS — I739 Peripheral vascular disease, unspecified: Secondary | ICD-10-CM | POA: Diagnosis not present

## 2014-11-19 DIAGNOSIS — M79606 Pain in leg, unspecified: Secondary | ICD-10-CM

## 2014-11-19 DIAGNOSIS — L97529 Non-pressure chronic ulcer of other part of left foot with unspecified severity: Secondary | ICD-10-CM | POA: Diagnosis not present

## 2014-11-19 MED ORDER — OXYCODONE-ACETAMINOPHEN 7.5-325 MG PO TABS: 1.0000 | ORAL_TABLET | Freq: Four times a day (QID) | ORAL | Status: DC | PRN

## 2014-11-19 NOTE — Progress Notes (Signed)
Subjective: Patient presents stating that she was put on blood thinner and Trental that cause her bleeding. She stopped taking Trental.  Stated that she was developing blisters and sores in her toes after stop taking blood thinner. Patient questions if she should take Trental again.   Objective: Pedal pulses are palpable on both feet.  Distal end of toes have ulcerated callus on left foot.  Has had blister developed on right 2nd toe.  Left toes are cold.  Positive of delayed capillary filling time on all digits.  Orthopedic: High arched cavus foot bilateral. No gross deformity bilateral. Limited first MPJ dorsiflexion L>R. Dermatologic: Missing digital nails on 1st and 4th right, 2nd through 4th left.  Neurologic: Decreased sensory response to Monofilament sensory testing. Normal vibratory sensations.   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  Pain in both feet.  Plan: Reviewed findings and available options. Patient was advised to return to PCP to resume blood thinner and referral to vascular specialist. Pain medication refilled as per request.

## 2014-11-19 NOTE — Patient Instructions (Signed)
Seen for bilateral foot pain with blister and sores on tip of toes that occurred since stop taking blood thinner. Need to return to vascular specialist to discuss medication.

## 2014-12-09 ENCOUNTER — Telehealth: Payer: Self-pay | Admitting: Neurology

## 2014-12-09 NOTE — Telephone Encounter (Signed)
Patient called stating [email or web address] requiring prior auth for SUMAtriptan 6 MG/0.5ML SOAJ .  Please call and advise. Patient can be reached at [phone].

## 2014-12-09 NOTE — Telephone Encounter (Addendum)
Ins has been contacted and provided with clinical info.  Request is currently under review Ref # [id number] I called the patient back to advise.  She is aware.   Medicaid has approved the request for coverage on Sumatriptan Injections effective until 12/05/2015 Ref # [id number]

## 2014-12-13 ENCOUNTER — Other Ambulatory Visit: Payer: Self-pay | Admitting: *Deleted

## 2014-12-13 DIAGNOSIS — R0989 Other specified symptoms and signs involving the circulatory and respiratory systems: Secondary | ICD-10-CM

## 2014-12-20 ENCOUNTER — Encounter: Payer: Self-pay | Admitting: Vascular Surgery

## 2014-12-21 ENCOUNTER — Ambulatory Visit (HOSPITAL_COMMUNITY)
Admission: RE | Admit: 2014-12-21 | Discharge: 2014-12-21 | Disposition: A | Discharge status: Home / Self Care | Payer: Medicaid Other | Source: Ambulatory Visit | Attending: Vascular Surgery | Admitting: Vascular Surgery

## 2014-12-21 ENCOUNTER — Encounter: Payer: Self-pay | Admitting: Vascular Surgery

## 2014-12-21 ENCOUNTER — Ambulatory Visit (INDEPENDENT_AMBULATORY_CARE_PROVIDER_SITE_OTHER): Payer: Medicaid Other | Admitting: Vascular Surgery

## 2014-12-21 VITALS — BP 109/72 | HR 81 | Temp 97.5°F | Resp 18 | Ht 60.0 in | Wt 208.0 lb

## 2014-12-21 DIAGNOSIS — I739 Peripheral vascular disease, unspecified: Secondary | ICD-10-CM | POA: Diagnosis not present

## 2014-12-21 DIAGNOSIS — R0989 Other specified symptoms and signs involving the circulatory and respiratory systems: Secondary | ICD-10-CM | POA: Diagnosis present

## 2014-12-21 DIAGNOSIS — I10 Essential (primary) hypertension: Secondary | ICD-10-CM | POA: Insufficient documentation

## 2014-12-21 NOTE — Progress Notes (Signed)
Subjective:     Patient ID: [name], female   DOB: 10-Sep-1970, 44 y.o.   MRN: [id number]  HPI this 44 year old female was referred for evaluation of lower extremity circulation. She has some superficial ulcerations on the first toes of both feet. Patient has a history of pneumonia and prolonged ventilatory support 3 or 4 years ago which required tracheostomy and apparently blood pressure support which caused severe vasoconstriction of her periphery due to sepsis. 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 She had ischemic appearing toes apparently. Over time this has improved. She has a long history of tobacco abuse 30+ years and 1 pack per day. She continues to smoke in the morning with her coffee. She has severe dyspnea on exertion which limits her ambulation.   Past Medical History  Diagnosis Date  . Renal insufficiency   . Asthma   . Coronary artery disease   . COPD (chronic obstructive pulmonary disease)   . Diabetes mellitus   . HEMORRHOIDS, INTERNAL   . ANEMIA OF CHRONIC DISEASE   . Tourette's disorder   . INSOMNIA, CHRONIC   . DEPRESSION   . 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 ACID REFLUX DISEASE   . Irritable bowel syndrome   . MENOPAUSE, SURGICAL   . DEGENERATIVE DISC DISEASE   . BACK PAIN   . Renal colic   . ALCOHOL ABUSE, HX OF   . PERSONAL HX OF METHICILLIN RESIST STAPH AUREUS   . DRUG ABUSE, HX OF   . BLADDER SUSPENSION, HX OF   . CANDIDIASIS   . PNEUMONIA, COMMUNITY ACQUIRED, PNEUMOCOCCAL   . Nephrolithiasis   . PUD (peptic ulcer disease)   . Migraine     Social History  Substance Use Topics  . Smoking status: Current Every Day Smoker -- 0.01 packs/day    Types: Cigarettes  . Smokeless tobacco: Never Used  . Alcohol Use: 0.0 oz/week    0 Standard drinks or equivalent per week     Comment: occasionally    Family History  Problem Relation Age of Onset  . Heart disease      No family history    Allergies   Allergen Reactions  . Ciprofloxacin Hives    REACTION: Hives  . Diphenhydramine     Other reaction(s): Hallucinations  . Diphenhydramine Hcl Other (See Comments)  . Diphenhydramine Hcl     Other reaction(s): DELIRIUM  . Divalproex Sodium Swelling    Other reaction(s): Other swelling  . Heparin Other (See Comments)    Other reaction(s): HYPERTENSION,SWELLING Raises BP and swells  . Ketorolac     Other reaction(s): URINARY RETENTION  . Ketorolac Tromethamine Other (See Comments)    Bladder Retention Bladder retention Bladder retintion  . Nefazodone   . Other     Other reaction(s): Other (See Comments) Per Lastword Other reaction(s): Agitation  . Sertraline   . Tramadol Other (See Comments)    Bladder retention  . Trazodone   . Trazodone And Nefazodone     Other reaction(s): Other hyperactive  Not agree with herTourette's   . Valproic Acid Swelling    Other reaction(s): SWELLING Other reaction(s): Facial Edema (intolerance)  . Sumatriptan Palpitations    REACTION: Palpitations     Current outpatient prescriptions:  .  albuterol (PROVENTIL HFA;VENTOLIN HFA) 108 (90 BASE) MCG/ACT inhaler, Inhale 2 puffs into the lungs every 6 (six) hours as needed. For shortness breath, Disp: , Rfl:  .  albuterol (PROVENTIL) (2.5  MG/3ML) 0.083% nebulizer solution, 2.5 mg., Disp: , Rfl:  .  amLODipine (NORVASC) 10 MG tablet, Take 10 mg by mouth., Disp: , Rfl:  .  Biotin 1 MG CAPS, Take 10,000 mg by mouth., Disp: , Rfl:  .  chlorproMAZINE (THORAZINE) 50 MG tablet, , Disp: , Rfl:  .  cholecalciferol (VITAMIN D) 1000 UNITS tablet, Take 400 Units by mouth., Disp: , Rfl:  .  cyclobenzaprine (FLEXERIL) 10 MG tablet, Take 10 mg by mouth., Disp: , Rfl:  .  diazepam (VALIUM) 5 MG tablet, Take 5 mg by mouth 3 (three) times daily., Disp: , Rfl:  .  docusate sodium (COLACE) 100 MG capsule, Take 200 mg by mouth., Disp: , Rfl:  .  esomeprazole (NEXIUM) 40 MG capsule, Take 40 mg by mouth 2 (two) times  daily., Disp: , Rfl:  .  ferrous fumarate (HEMOCYTE - 106 MG FE) 325 (106 FE) MG TABS, Take 2 tablets by mouth daily. , Disp: , Rfl:  .  ferrous sulfate 325 (65 FE) MG tablet, Take 1 tablet by mouth., Disp: , Rfl:  .  FLUoxetine (PROZAC) 40 MG capsule, Take 40 mg by mouth daily. , Disp: , Rfl:  .  fluPHENAZine (PROLIXIN) 2.5 MG tablet, , Disp: , Rfl:  .  gabapentin (NEURONTIN) 300 MG capsule, Take 300 mg by mouth 4 (four) times daily. , Disp: , Rfl:  .  hydrochlorothiazide (HYDRODIURIL) 25 MG tablet, 1 mg., Disp: , Rfl:  .  iron polysaccharides (NU-IRON) 150 MG capsule, Frequency:   Dosage:0   MG  Instructions:  Note:TAKE 1 CAPSULE BY MOUTH TWICE DAILY, Disp: , Rfl:  .  levothyroxine (SYNTHROID, LEVOTHROID) 25 MCG tablet, Take 25 mcg by mouth., Disp: , Rfl:  .  lithium carbonate 300 MG capsule, , Disp: , Rfl:  .  loperamide (IMODIUM) 2 MG capsule, Take 2 mg by mouth 4 (four) times daily as needed. For diarrhea, Disp: , Rfl:  .  mometasone (NASONEX) 50 MCG/ACT nasal spray, Place 2 sprays into the nose., Disp: , Rfl:  .  Multiple Vitamins-Minerals (THERA-M) TABS, Take 1 tablet by mouth., Disp: , Rfl:  .  omeprazole (PRILOSEC) 20 MG capsule, , Disp: , Rfl:  .  oxyCODONE-acetaminophen (PERCOCET) 7.5-325 MG per tablet, Take 1 tablet by mouth every 6 (six) hours as needed., Disp: 60 tablet, Rfl: 0 .  pentoxifylline (TRENTAL) 400 MG CR tablet, Take 1 tablet (400 mg total) by mouth 3 (three) times daily with meals., Disp: 90 tablet, Rfl: 3 .  polyethylene glycol (MIRALAX / GLYCOLAX) packet, Take 17 g by mouth as needed., Disp: , Rfl:  .  potassium chloride (K-DUR) 10 MEQ tablet, Take 10 mEq by mouth., Disp: , Rfl:  .  promethazine (PHENERGAN) 25 MG/ML injection, Inject 25 mg into the muscle., Disp: , Rfl:  .  propranolol ER (INDERAL LA) 60 MG 24 hr capsule, Take 1 capsule (60 mg total) by mouth at bedtime., Disp: 30 capsule, Rfl: 11 .  pseudoephedrine-acetaminophen (TYLENOL SINUS) 30-500 MG TABS, Take 2  tablets by mouth every 4 (four) hours as needed. Sinus pressure, Disp: , Rfl:  .  ranitidine (ZANTAC) 150 MG tablet, Take 150 mg by mouth at bedtime., Disp: , Rfl:  .  rizatriptan (MAXALT) 10 MG tablet, TAKE 1 TABLET (10 MG TOTAL) BY MOUTH AS NEEDED FOR MIGRAINE. MAY REPEAT IN 2 HOURS IF NEEDED, Disp: 10 tablet, Rfl: 11 .  SUMAtriptan 6 MG/0.5ML SOAJ, INJECT 6 MG INTO THE SKIN AS NEEDED., Disp: 6 Syringe,  Rfl: 3 .  Tapentadol HCl (NUCYNTA ER) 100 MG TB12, , Disp: , Rfl:   Current facility-administered medications:  .  valproate (DEPACON) 1,000 mg in sodium chloride 0.9 % 100 mL IVPB, 1,000 mg, Intravenous, Continuous, Levert Feinstein, MD, Stopped at 03/18/13 0850  Filed Vitals:   12/21/14 1011  BP: 109/72  Pulse: 81  Temp: 97.5 F (36.4 C)  Resp: 18  Height: 5' (1.524 m)  Weight: 208 lb (94.348 kg)  SpO2: 90%    Body mass index is 40.62 kg/(m^2).           Review of Systems denies chest pain but does have dyspnea on exertion, orthopnea, leg pain with walking, history of DVT in her left upper extremity from a PICC line, swelling in her legs, wheezing, chronic COPD, weakness in her arms and legs, GERD, history of major depression.     Objective:   Physical Exam BP 109/72 mmHg  Pulse 81  Temp(Src) 97.5 F (36.4 C)  Resp 18  Ht 5' (1.524 m)  Wt 208 lb (94.348 kg)  BMI 40.62 kg/m2  SpO2 90%  LMP 05/29/2010  Gen.-alert and oriented x3 in no apparent distress HEENT normal for age-previous tracheostomy scar healed  Lungs no rhonchi or wheezing Cardiovascular regular rhythm no murmurs carotid pulses 3+ palpable no bruits audible Abdomen soft nontender no palpable masses Musculoskeletal free of  major deformities Skin clear -no rashes Neurologic normal Lower extremities 3+ femoral and dorsalis pedis pulses palpable bilaterally with no edema-superficial healed ulcerations at the tip of first toes bilaterally. No gangrene or necrosis noted on physical exam. Her graft that were  ordered lower strength and arterial Dopplers which I reviewed and interpreted. There is triphasic flow in both feet with ABIs of 1.0 or greater in dorsalis pedis and posterior tibial bilaterally.       Assessment:     #1 previous history of severe ischemia of her and lower extremity digits due to sepsis and prolonged vaso-pressor support with persistent poor tissue and peripheral skin #2 long history of tobacco abuse-ongoing #3 COPD-previous prolonged ventilatory support with tracheostomy-chronic dyspnea on exertion on oxygen #4 diabetes mellitus    Plan:     Arterial evaluation is totally normal with excellent flow down both lower extremities and normal pressures. Emphasized the patient importance of total tobacco cessation If she develops peripheral ulceration she should be referred to the wound center for further treatment Would avoid any type of surgery on her feet unless absolutely necessary particularly in light of ongoing tobacco abuse and diabetes

## 2015-01-20 ENCOUNTER — Ambulatory Visit: Payer: Medicaid Other | Admitting: Neurology

## 2015-02-10 ENCOUNTER — Encounter: Payer: Self-pay | Admitting: Neurology

## 2015-02-10 ENCOUNTER — Ambulatory Visit (INDEPENDENT_AMBULATORY_CARE_PROVIDER_SITE_OTHER): Payer: Medicaid Other | Admitting: Neurology

## 2015-02-10 VITALS — BP 128/88 | HR 72 | Ht 60.0 in | Wt 201.0 lb

## 2015-02-10 DIAGNOSIS — IMO0002 Reserved for concepts with insufficient information to code with codable children: Secondary | ICD-10-CM

## 2015-02-10 DIAGNOSIS — G43709 Chronic migraine without aura, not intractable, without status migrainosus: Secondary | ICD-10-CM | POA: Diagnosis not present

## 2015-02-10 MED ORDER — SUMATRIPTAN SUCCINATE 6 MG/0.5ML ~~LOC~~ SOAJ: 6.0000 mg | SUBCUTANEOUS | Status: DC | PRN

## 2015-02-10 MED ORDER — RIZATRIPTAN BENZOATE 10 MG PO TABS: ORAL_TABLET | ORAL | Status: DC

## 2015-02-10 NOTE — Progress Notes (Signed)
Chief Complaint  Patient presents with  . Migraine    Feels the Inderal mildly improved the frequency of her headaches.  Imitrex and Maxalt resolve her more severe headaches.  . Sleep Apnea    Says she is compliant with her CPAP therapy.   Chief Complaint  Patient presents with  . Migraine    Feels the Inderal mildly improved the frequency of her headaches.  Imitrex and Maxalt resolve her more severe headaches.  . Sleep Apnea    Says she is compliant with her CPAP therapy.     [facility]  PATIENT: [name] DOB: 09-02-1970   REASON FOR VISIT: Followup for headache   HISTORY OF PRESENT ILLNESS:[name], 44 year old female returns for follow up of her chronic migraine. She was last seen in August 2015 by [name].   HISTORY:. She was patient of Dr. Sandria Manly, I saw her previously for Botox injection as migraine prevention.  She has past medical history of peptic ulcer, kidney stone, bipolar, history of polysubstance abuse,  She presented with chronic migraine headaches since age 70, progressively worse, her typical migraine are lateralized retro-orbital area severe pounding headache, with associated photo/phono/osmophobia, nausea vomitting, can last hours to days,  Over the years, she has tried and failed different preventive medication such as Topamax, Depakote, Lyrica, Nortriptyline, worsening tourette, she is currently taking gabapentin,  Over the past few years, she complains of daily headaches, has been taking over-the-counter Aleve migraine, Excedrin Migraine, ice packs, Goody Powder on a daily basis, for a while she was taking Maxalt, Stadol nasal spray, but is no longer on those medications, Maxalt does help her headache, sumatriptan caused heart palpitations  She reported history of Botox injection in 2004 once, which has been very helpful, lasting for 2 and half years, for insurance reasons, also worry about the injection pain, she only received once.  She  had a few sets of Botox injection in 2013, most recent from August 2013, which only help her mild to moderately, she was also under the care of pain management, is receiving epidural injection for neck pain, she used  stadol as rescue therapy for her headaches  She was admitted to the hospital in January 2014 for severe pneumonia, she also developed kidney failure, requiring hemodialysis, she has bilateral fingertips, toes gangrene, require right index finger amputation, has prolonged ICU stay, tracheostomy, She was discharged to nursing home initially, she is now back home, she complains of constant daily headaches, starting from upper and nuchal area, spreading forward, bilateral temporal, frontal region, pressure, moderate, with associated light noise sensitivity, Maxalt provided moderate help, Imitrex SQ was very helpful. she is also taking gabapentin 300 mg 4 times a day,   UPDATE Feb 19th 2016: Last visit was with [name] in August 2015, she is taking Imitrex Sq prn, about 8/month, she has is also taking Maxalt prn, used 10 tabs each month, Zofran as needed She still has migraines, 2-3/week, sometimes she woke up with headaches, headache can last half day, 2 couple days, with associated light noise sensitivity, nauseous,  UPDATE October 21 2014: She had one episode of severe headaches, lasted for 7 days, eventually responded to repeated Imitrex injection , overall she has less headaches, but continue have mild/moderate daily early-morning pressure headaches,   She was admitted to hospital in April 2016 for pneumonia, recovered well, was put on home oxygen  She also had sleep study recently was diagnosed with obstructive sleep apnea, is going to get  CPAP machine soon. For headache prevention, she has tried nortriptyline, worsening Tourette's syndrome, also tried and failed Depakote, Lyrica, Topamax.   UPDATE Feb 10 2015: She came in with oxygen,  Her headaches has much improved, she is able to  tolerate Inderal LA 80 mg daily, she takes Maxalt as needed, Imitrex injection as rescue therapy for prolonged severe migraines,  She had gradual worsening COPD, previous longtime smoker, recurrent pneumonia, was recently diagnosed as acid reflux, had lung biopsy at [facility].  REVIEW OF SYSTEMS: Full 14 system review of systems performed and notable only for those listed, all others are neg: Fatigue, runny nose, excessive sweat, cold intolerance, heat intolerance, excessive thirst, shortness of breath, insomnia, nausea, frequent awakening, daytime sleepiness, incontinence of bladder, anemia, memory loss, headaches, weakness, anxiety   ALLERGIES: Allergies  Allergen Reactions  . Ciprofloxacin Hives    REACTION: Hives  . Diphenhydramine     Other reaction(s): Hallucinations  . Diphenhydramine Hcl Other (See Comments)  . Diphenhydramine Hcl     Other reaction(s): DELIRIUM  . Divalproex Sodium Swelling    Other reaction(s): Other swelling  . Heparin Other (See Comments)    Other reaction(s): HYPERTENSION,SWELLING Raises BP and swells  . Ketorolac     Other reaction(s): URINARY RETENTION  . Ketorolac Tromethamine Other (See Comments)    Bladder Retention Bladder retention Bladder retintion  . Nefazodone   . Other     Other reaction(s): Other (See Comments) Per Lastword Other reaction(s): Agitation  . Sertraline   . Tramadol Other (See Comments)    Bladder retention  . Trazodone   . Trazodone And Nefazodone     Other reaction(s): Other hyperactive  Not agree with herTourette's   . Valproic Acid Swelling    Other reaction(s): SWELLING Other reaction(s): Facial Edema (intolerance)  . Sumatriptan Palpitations    REACTION: Palpitations    HOME MEDICATIONS: Outpatient Prescriptions Prior to Visit  Medication Sig Dispense Refill  . albuterol (PROVENTIL HFA;VENTOLIN HFA) 108 (90 BASE) MCG/ACT inhaler Inhale 2 puffs into the lungs every 6 (six) hours as needed. For  shortness breath    . albuterol (PROVENTIL) (2.5 MG/3ML) 0.083% nebulizer solution 2.5 mg.    . amLODipine (NORVASC) 10 MG tablet Take 10 mg by mouth.    . Biotin 1 MG CAPS Take 10,000 mg by mouth.    . chlorproMAZINE (THORAZINE) 50 MG tablet     . cholecalciferol (VITAMIN D) 1000 UNITS tablet Take 400 Units by mouth.    . cyclobenzaprine (FLEXERIL) 10 MG tablet Take 10 mg by mouth.    . diazepam (VALIUM) 5 MG tablet Take 5 mg by mouth 3 (three) times daily.    Marland Kitchen. docusate sodium (COLACE) 100 MG capsule Take 200 mg by mouth.    . esomeprazole (NEXIUM) 40 MG capsule Take 40 mg by mouth 2 (two) times daily.    . ferrous fumarate (HEMOCYTE - 106 MG FE) 325 (106 FE) MG TABS Take 2 tablets by mouth daily.     . ferrous sulfate 325 (65 FE) MG tablet Take 1 tablet by mouth.    Marland Kitchen. FLUoxetine (PROZAC) 40 MG capsule Take 40 mg by mouth daily.     . fluPHENAZine (PROLIXIN) 2.5 MG tablet     . gabapentin (NEURONTIN) 300 MG capsule Take 300 mg by mouth 4 (four) times daily.     . hydrochlorothiazide (HYDRODIURIL) 25 MG tablet 1 mg.    . iron polysaccharides (NU-IRON) 150 MG capsule Frequency:   Dosage:0  MG  Instructions:  Note:TAKE 1 CAPSULE BY MOUTH TWICE DAILY    . levothyroxine (SYNTHROID, LEVOTHROID) 25 MCG tablet Take 25 mcg by mouth.    . lithium carbonate 300 MG capsule     . loperamide (IMODIUM) 2 MG capsule Take 2 mg by mouth 4 (four) times daily as needed. For diarrhea    . mometasone (NASONEX) 50 MCG/ACT nasal spray Place 2 sprays into the nose.    . Multiple Vitamins-Minerals (THERA-M) TABS Take 1 tablet by mouth.    Marland Kitchen omeprazole (PRILOSEC) 20 MG capsule     . oxyCODONE-acetaminophen (PERCOCET) 7.5-325 MG per tablet Take 1 tablet by mouth every 6 (six) hours as needed. 60 tablet 0  . pentoxifylline (TRENTAL) 400 MG CR tablet Take 1 tablet (400 mg total) by mouth 3 (three) times daily with meals. 90 tablet 3  . polyethylene glycol (MIRALAX / GLYCOLAX) packet Take 17 g by mouth as needed.    .  promethazine (PHENERGAN) 25 MG/ML injection Inject 25 mg into the muscle.    . propranolol ER (INDERAL LA) 60 MG 24 hr capsule Take 1 capsule (60 mg total) by mouth at bedtime. 30 capsule 11  . pseudoephedrine-acetaminophen (TYLENOL SINUS) 30-500 MG TABS Take 2 tablets by mouth every 4 (four) hours as needed. Sinus pressure    . ranitidine (ZANTAC) 150 MG tablet Take 150 mg by mouth at bedtime.    . rizatriptan (MAXALT) 10 MG tablet TAKE 1 TABLET (10 MG TOTAL) BY MOUTH AS NEEDED FOR MIGRAINE. MAY REPEAT IN 2 HOURS IF NEEDED 10 tablet 11  . SUMAtriptan 6 MG/0.5ML SOAJ INJECT 6 MG INTO THE SKIN AS NEEDED. 6 Syringe 3  . Tapentadol HCl (NUCYNTA ER) 100 MG TB12     . potassium chloride (K-DUR) 10 MEQ tablet Take 10 mEq by mouth.     Facility-Administered Medications Prior to Visit  Medication Dose Route Frequency Provider Last Rate Last Dose  . valproate (DEPACON) 1,000 mg in sodium chloride 0.9 % 100 mL IVPB  1,000 mg Intravenous Continuous Levert Feinstein, MD   Stopped at 03/18/13 0850    PAST MEDICAL HISTORY: Past Medical History  Diagnosis Date  . Renal insufficiency   . Asthma   . Coronary artery disease   . COPD (chronic obstructive pulmonary disease) (HCC)   . Diabetes mellitus   . HEMORRHOIDS, INTERNAL   . ANEMIA OF CHRONIC DISEASE   . Tourette's disorder   . INSOMNIA, CHRONIC   . DEPRESSION   . 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 ACID REFLUX DISEASE   . Irritable bowel syndrome   . MENOPAUSE, SURGICAL   . DEGENERATIVE DISC DISEASE   . BACK PAIN   . Renal colic   . ALCOHOL ABUSE, HX OF   . PERSONAL HX OF METHICILLIN RESIST STAPH AUREUS   . DRUG ABUSE, HX OF   . BLADDER SUSPENSION, HX OF   . CANDIDIASIS   . PNEUMONIA, COMMUNITY ACQUIRED, PNEUMOCOCCAL   . Nephrolithiasis   . PUD (peptic ulcer disease)   . Migraine     PAST SURGICAL HISTORY: Past Surgical History  Procedure Laterality Date  . Abdominal hysterectomy    . Eye surgery    . Bladder tack      FAMILY  HISTORY: Family History  Problem Relation Age of Onset  . Heart disease      No family history    SOCIAL HISTORY: Social History   Social History  . Marital Status: Legally Separated  Spouse Name: N/A  . Number of Children: 2  . Years of Education: N/A   Occupational History  .      Unemployed   Social History Main Topics  . Smoking status: Current Every Day Smoker -- 0.01 packs/day    Types: Cigarettes  . Smokeless tobacco: Never Used  . Alcohol Use: 0.0 oz/week    0 Standard drinks or equivalent per week     Comment: occasionally  . Drug Use: No     Comment: Previous use of marijuana and cocaine  . Sexual Activity: Not on file   Other Topics Concern  . Not on file   Social History Narrative   Patient is unemployed. Graduated from equivalency degree.   Divorced.     PHYSICAL EXAM  Filed Vitals:   02/10/15 1220  BP: 128/88  Pulse: 72  Height: 5' (1.524 m)  Weight: 201 lb (91.173 kg)   Body mass index is 39.26 kg/(m^2).  PHYSICAL EXAMNIATION:  Gen: NAD, conversant, well nourised, obese, well groomed                     Cardiovascular: Regular rate rhythm, no peripheral edema, warm, nontender. Eyes: Conjunctivae clear without exudates or hemorrhage Neck: Supple, no carotid bruise. Pulmonary: Clear to auscultation bilaterally   NEUROLOGICAL EXAM:  MENTAL STATUS: Speech:    Speech is normal; fluent and spontaneous with normal comprehension.  Cognition:    The patient is oriented to person, place, and time;     recent and remote memory intact;     language fluent;     normal attention, concentration,     fund of knowledge.  CRANIAL NERVES: CN II: Visual fields are full to confrontation.Pupils were equal round reactive to light CN III, IV, VI: extraocular movement are normal. No ptosis. CN V: Facial sensation is intact to pinprick in all 3 divisions bilaterally. Corneal responses are intact.  CN VII: Face is symmetric with normal eye closure and  smile. CN VIII: Hearing is normal to rubbing fingers CN IX, X: Palate elevates symmetrically. Phonation is normal. CN XI: Head turning and shoulder shrug are intact CN XII: Tongue is midline with normal movements and no atrophy.  MOTOR: There is no pronator drift of out-stretched arms. Muscle bulk and tone are normal. Muscle strength is normal.  REFLEXES: Reflexes are 2+ and symmetric at the biceps, triceps, knees, and ankles. Plantar responses are flexor.  SENSORY: Light touch, pinprick, position sense, and vibration sense are intact in fingers and toes.  COORDINATION: Rapid alternating movements and fine finger movements are intact. There is no dysmetria on finger-to-nose and heel-knee-shin.   GAIT/STANCE: Posture is normal. Gait is steady with normal steps, base, arm swing, and turning. Heel and toe walking are normal. Tandem gait is normal.  Romberg is absent.   DIAGNOSTIC DATA (LABS, IMAGING, TESTING) -ASSESSMENT AND PLAN  44 y.o. year old female  has a past medical history of chronic migraines, failed multiple preventative medications in the past, including Topamax, Depakote, Lyrica, Neurontin, nortriptyline, continue has 2-3 headaches each week, lasting few hours to days, taking multiple triptan treatment, including Maxalt, Imitrex as rescue treatment,  Chronic migraine  Her headache overall has improved some, she is able to tolerating Inderal LA 80 mg every day,  Maxalt as needed,  Imitrex subcutaneous injection as rescue therapy for severe prolonged headaches  Obstructive sleep apnea  She is using CPAP machine,  She also has COPD, oxygen dependent,  Return to clinic  in one year  Levert Feinstein, M.D. Ph.D.  [facility] [address] [place], [place] [address] Phone: [phone] Fax:      [phone]

## 2015-03-20 ENCOUNTER — Other Ambulatory Visit: Payer: Self-pay | Admitting: Neurology

## 2015-06-08 ENCOUNTER — Other Ambulatory Visit: Payer: Self-pay | Admitting: Neurology

## 2015-06-29 ENCOUNTER — Other Ambulatory Visit (HOSPITAL_COMMUNITY): Payer: Self-pay | Admitting: Urology

## 2015-06-29 DIAGNOSIS — N1330 Unspecified hydronephrosis: Secondary | ICD-10-CM

## 2015-07-11 ENCOUNTER — Emergency Department (HOSPITAL_COMMUNITY)
Admission: EM | Admit: 2015-07-11 | Discharge: 2015-07-11 | Disposition: A | Discharge status: Home / Self Care | Payer: Medicaid Other

## 2015-07-11 ENCOUNTER — Ambulatory Visit (HOSPITAL_COMMUNITY)
Admission: RE | Admit: 2015-07-11 | Discharge: 2015-07-11 | Disposition: A | Discharge status: Home / Self Care | Payer: Medicaid Other | Source: Ambulatory Visit | Attending: Urology | Admitting: Urology

## 2015-07-11 DIAGNOSIS — R1011 Right upper quadrant pain: Secondary | ICD-10-CM | POA: Diagnosis present

## 2015-07-11 DIAGNOSIS — N1330 Unspecified hydronephrosis: Secondary | ICD-10-CM | POA: Diagnosis not present

## 2015-07-11 MED ORDER — TECHNETIUM TC 99M MERTIATIDE
14.1000 | Freq: Once | INTRAVENOUS | Status: AC | PRN
  Administered 2015-07-11: 14.1 via INTRAVENOUS

## 2015-07-11 MED ORDER — FUROSEMIDE 10 MG/ML IJ SOLN
46.0000 mg | Freq: Once | INTRAMUSCULAR | Status: AC
  Administered 2015-07-11: 46 mg via INTRAVENOUS

## 2015-07-11 MED ORDER — FUROSEMIDE 10 MG/ML IJ SOLN
INTRAMUSCULAR | Status: AC
  Filled 2015-07-11: qty 8

## 2016-02-13 ENCOUNTER — Telehealth: Payer: Self-pay | Admitting: *Deleted

## 2016-02-13 ENCOUNTER — Ambulatory Visit: Payer: Medicaid Other | Admitting: Neurology

## 2016-02-13 NOTE — Telephone Encounter (Signed)
Called answering service at 6:35pm the evening prior to her 12:00 appt the following day.

## 2016-02-20 ENCOUNTER — Other Ambulatory Visit: Payer: Self-pay | Admitting: Neurology

## 2016-03-21 ENCOUNTER — Other Ambulatory Visit: Payer: Self-pay | Admitting: Neurology

## 2016-05-17 ENCOUNTER — Encounter: Payer: Self-pay | Admitting: *Deleted

## 2016-05-17 ENCOUNTER — Other Ambulatory Visit: Payer: Self-pay | Admitting: *Deleted

## 2016-05-17 ENCOUNTER — Telehealth: Payer: Self-pay | Admitting: Neurology

## 2016-05-17 MED ORDER — SUMATRIPTAN SUCCINATE 6 MG/0.5ML ~~LOC~~ SOAJ: SUBCUTANEOUS | 2 refills | Status: DC

## 2016-05-17 NOTE — Telephone Encounter (Signed)
Patient requesting refill for SUMAtriptan 6 MG/0.5ML SOAJ.  Patient scheduled to see Dr. Terrace ArabiaYan 07/26/16 with Dr. Terrace ArabiaYan.

## 2016-05-17 NOTE — Telephone Encounter (Signed)
Refills sent to pharmacy with supply to last until her follow up appt.

## 2016-05-24 ENCOUNTER — Other Ambulatory Visit: Payer: Self-pay | Admitting: Neurology

## 2016-05-25 ENCOUNTER — Other Ambulatory Visit: Payer: Self-pay

## 2016-05-25 MED ORDER — RIZATRIPTAN BENZOATE 10 MG PO TABS: ORAL_TABLET | ORAL | 0 refills | Status: DC

## 2016-07-26 ENCOUNTER — Ambulatory Visit (INDEPENDENT_AMBULATORY_CARE_PROVIDER_SITE_OTHER): Payer: Medicaid Other | Admitting: Neurology

## 2016-07-26 ENCOUNTER — Encounter: Payer: Self-pay | Admitting: Neurology

## 2016-07-26 ENCOUNTER — Encounter (INDEPENDENT_AMBULATORY_CARE_PROVIDER_SITE_OTHER): Payer: Self-pay

## 2016-07-26 ENCOUNTER — Telehealth: Payer: Self-pay | Admitting: Neurology

## 2016-07-26 VITALS — BP 116/77 | HR 61 | Resp 20 | Ht 60.0 in | Wt 187.0 lb

## 2016-07-26 DIAGNOSIS — G43019 Migraine without aura, intractable, without status migrainosus: Secondary | ICD-10-CM

## 2016-07-26 MED ORDER — FLUOXETINE HCL 20 MG PO TABS: 40.0000 mg | ORAL_TABLET | Freq: Every day | ORAL | 11 refills | Status: DC

## 2016-07-26 MED ORDER — RIZATRIPTAN BENZOATE 10 MG PO TBDP: 10.0000 mg | ORAL_TABLET | ORAL | 6 refills | Status: AC | PRN

## 2016-07-26 NOTE — Progress Notes (Signed)
Chief Complaint  Patient presents with  . Follow-up    migraines, sinus problems,  . PCP    Dr. Vanna Scotland   Chief Complaint  Patient presents with  . Follow-up    migraines, sinus problems,  . PCP    Dr. Vanna Scotland     [facility]  PATIENT: [name] DOB: 1970-06-23   REASON FOR VISIT: Followup for headache   HISTORY OF PRESENT ILLNESS:[name], 46 year old female returns for follow up of her chronic migraine. She was last seen in August 2015 by [name].   She was patient of Dr. Sandria Manly, I saw her previously for Botox injection as migraine prevention.  She has past medical history of peptic ulcer, kidney stone, bipolar, history of polysubstance abuse,  She presented with chronic migraine headaches since age 73, progressively worse, her typical migraine are lateralized retro-orbital area severe pounding headache, with associated photo/phono/osmophobia, nausea vomitting, can last hours to days,   Over the years, she has tried and failed different preventive medication such as Topamax, Depakote, Lyrica, Nortriptyline, worsening tourette, she is currently taking gabapentin,   Over the past few years, she complains of daily headaches, has been taking over-the-counter Aleve migraine, Excedrin Migraine, ice packs, Goody Powder on a daily basis, for a while she was taking Maxalt, Stadol nasal spray, but is no longer on those medications, Maxalt does help her headache, sumatriptan caused heart palpitations   She reported history of Botox injection in 2004 once, which has been very helpful, lasting for 2 and half years, for insurance reasons, also worry about the injection pain, she only received once.  She had a few sets of Botox injection in 2013, most recent from August 2013, which only help her mild to moderately, she was also under the care of pain management, is receiving epidural injection for neck pain, she used  stadol as rescue therapy for her headaches     She was admitted to the hospital in January 2014 for severe pneumonia, she also developed kidney failure, requiring hemodialysis, she has bilateral fingertips, toes gangrene, require right index finger amputation, has prolonged ICU stay, tracheostomy, She was discharged to nursing home initially, she is now back home, she complains of constant daily headaches, starting from upper and nuchal area, spreading forward, bilateral temporal, frontal region, pressure, moderate, with associated light noise sensitivity, Maxalt provided moderate help, Imitrex SQ was very helpful. she is also taking gabapentin 300 mg 4 times a day,   UPDATE Feb 19th 2016: Last visit was with [name] in August 2015, she is taking Imitrex Sq prn, about 8/month, she has is also taking Maxalt prn, used 10 tabs each month, Zofran as needed She still has migraines, 2-3/week, sometimes she woke up with headaches, headache can last half day, 2 couple days, with associated light noise sensitivity, nauseous,  UPDATE October 21 2014: She had one episode of severe headaches, lasted for 7 days, eventually responded to repeated Imitrex injection , overall she has less headaches, but continue have mild/moderate daily early-morning pressure headaches,   She was admitted to hospital in April 2016 for pneumonia, recovered well, was put on home oxygen  She also had sleep study recently was diagnosed with obstructive sleep apnea, is going to get CPAP machine soon. For headache prevention, she has tried nortriptyline, worsening Tourette's syndrome, also tried and failed Depakote, Lyrica, Topamax.   UPDATE Feb 10 2015: She came in with oxygen,  Her headaches has much improved, she is able  to tolerate Inderal LA 80 mg daily, she takes Maxalt as needed, Imitrex injection as rescue therapy for prolonged severe migraines,  She had gradual worsening COPD, previous longtime smoker, recurrent pneumonia, was recently diagnosed as acid reflux, had lung  biopsy at [facility].  UPDATE July 26 2016: She has home oxygen as needed, especially at nighttime, she has GI problem, with frequent upper GI pain, she still has frequent headaches, she is taking Tylenol Sinus, which including Tylenol 325 mg per capsule, guaifenesin 200 mg, Phenylepinephrine an 5 mg per capsule every 3-4 hours, she constant sniffing.  She is almost embarrassed to go outside. 6-8 tablets a day, Fioricet upper to 2 tablets a day, is on top of that she is also taking Maxalt 10 mg as needed 15 tablets each months, sumatriptan injection as needed for more severe migraine headaches  She also concerns worsening Tourette's, frequent mouth perking, nose twitching movement, clearing her throat,  REVIEW OF SYSTEMS: Full 14 system review of systems performed and notable only for those listed, all others are neg: as above  ALLERGIES: Allergies  Allergen Reactions  . Ciprofloxacin Hives    REACTION: Hives  . Diphenhydramine     Other reaction(s): Hallucinations  . Diphenhydramine Hcl Other (See Comments)  . Diphenhydramine Hcl     Other reaction(s): DELIRIUM  . Divalproex Sodium Swelling    Other reaction(s): Other swelling  . Heparin Other (See Comments)    Other reaction(s): HYPERTENSION,SWELLING Raises BP and swells  . Ketorolac     Other reaction(s): URINARY RETENTION  . Ketorolac Tromethamine Other (See Comments)    Bladder Retention Bladder retention Bladder retintion  . Nefazodone   . Other     Other reaction(s): Other (See Comments) Per Lastword Other reaction(s): Agitation  . Sertraline   . Tramadol Other (See Comments)    Bladder retention  . Trazodone   . Trazodone And Nefazodone     Other reaction(s): Other hyperactive  Not agree with herTourette's   . Valproic Acid Swelling    Other reaction(s): SWELLING Other reaction(s): Facial Edema (intolerance)    HOME MEDICATIONS: Outpatient Medications Prior to Visit  Medication Sig Dispense Refill  .  albuterol (PROVENTIL HFA;VENTOLIN HFA) 108 (90 BASE) MCG/ACT inhaler Inhale 2 puffs into the lungs every 6 (six) hours as needed. For shortness breath    . albuterol (PROVENTIL) (2.5 MG/3ML) 0.083% nebulizer solution 2.5 mg.    . amLODipine (NORVASC) 10 MG tablet Take 10 mg by mouth.    . Biotin 1 MG CAPS Take 10,000 mg by mouth.    . cholecalciferol (VITAMIN D) 1000 UNITS tablet Take 400 Units by mouth.    . diazepam (VALIUM) 5 MG tablet Take 5 mg by mouth 3 (three) times daily.    Marland Kitchen docusate sodium (COLACE) 100 MG capsule Take 200 mg by mouth.    . esomeprazole (NEXIUM) 40 MG capsule Take 40 mg by mouth 2 (two) times daily.    . ferrous fumarate (HEMOCYTE - 106 MG FE) 325 (106 FE) MG TABS Take 2 tablets by mouth daily.     . ferrous sulfate 325 (65 FE) MG tablet Take 1 tablet by mouth.    . gabapentin (NEURONTIN) 300 MG capsule Take 300 mg by mouth 4 (four) times daily.     . hydrochlorothiazide (HYDRODIURIL) 25 MG tablet 1 mg.    . iron polysaccharides (NU-IRON) 150 MG capsule Frequency:   Dosage:0   MG  Instructions:  Note:TAKE 1 CAPSULE BY MOUTH TWICE  DAILY    . levothyroxine (SYNTHROID, LEVOTHROID) 25 MCG tablet Take 25 mcg by mouth.    . lithium carbonate 300 MG capsule     . loperamide (IMODIUM) 2 MG capsule Take 2 mg by mouth 4 (four) times daily as needed. For diarrhea    . Multiple Vitamins-Minerals (THERA-M) TABS Take 1 tablet by mouth.    Marland Kitchen omeprazole (PRILOSEC) 20 MG capsule     . OXYGEN Inhale into the lungs. 2L per min    . polyethylene glycol (MIRALAX / GLYCOLAX) packet Take 17 g by mouth as needed.    . promethazine (PHENERGAN) 25 MG/ML injection Inject 25 mg into the muscle.    . pseudoephedrine-acetaminophen (TYLENOL SINUS) 30-500 MG TABS Take 2 tablets by mouth every 4 (four) hours as needed. Sinus pressure    . ranitidine (ZANTAC) 150 MG tablet Take 150 mg by mouth at bedtime.    . rizatriptan (MAXALT) 10 MG tablet TAKE 1 TABLET BY MOUTH AS NEEDED FOR MIGRAINE. MAY REPEAT  IN 2 HOURS AS NEEDED 10 tablet 0  . SUMAtriptan 6 MG/0.5ML SOAJ Inject at onset of migraine.  May repeat once, if needed.  Max doses: #2/24 hrs, #12/30 days. 12 Syringe 2  . Tapentadol HCl (NUCYNTA ER) 100 MG TB12     . potassium chloride (K-DUR) 10 MEQ tablet Take 10 mEq by mouth.    . propranolol ER (INDERAL LA) 60 MG 24 hr capsule Take 1 capsule (60 mg total) by mouth at bedtime. 30 capsule 11  . chlorproMAZINE (THORAZINE) 50 MG tablet     . cyclobenzaprine (FLEXERIL) 10 MG tablet Take 10 mg by mouth.    Marland Kitchen FLUoxetine (PROZAC) 40 MG capsule Take 40 mg by mouth daily.     . fluPHENAZine (PROLIXIN) 2.5 MG tablet     . mometasone (NASONEX) 50 MCG/ACT nasal spray Place 2 sprays into the nose.    . oxyCODONE-acetaminophen (PERCOCET) 7.5-325 MG per tablet Take 1 tablet by mouth every 6 (six) hours as needed. 60 tablet 0  . pentoxifylline (TRENTAL) 400 MG CR tablet Take 1 tablet (400 mg total) by mouth 3 (three) times daily with meals. 90 tablet 3   Facility-Administered Medications Prior to Visit  Medication Dose Route Frequency Provider Last Rate Last Dose  . valproate (DEPACON) 1,000 mg in sodium chloride 0.9 % 100 mL IVPB  1,000 mg Intravenous Continuous Levert Feinstein, MD   Stopped at 03/18/13 0850    PAST MEDICAL HISTORY: Past Medical History:  Diagnosis Date  . ACID REFLUX DISEASE   . ALCOHOL ABUSE, HX OF   . ANEMIA OF CHRONIC DISEASE   . Asthma   . BACK PAIN   . BLADDER SUSPENSION, HX OF   . 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 CANDIDIASIS   . COPD (chronic obstructive pulmonary disease) (HCC)   . Coronary artery disease   . DEGENERATIVE DISC DISEASE   . DEPRESSION   . Diabetes mellitus   . DRUG ABUSE, HX OF   . HEMORRHOIDS, INTERNAL   . INSOMNIA, CHRONIC   . Irritable bowel syndrome   . MENOPAUSE, SURGICAL   . Migraine   . Nephrolithiasis   . PERSONAL HX OF METHICILLIN RESIST STAPH AUREUS   . PNEUMONIA, COMMUNITY ACQUIRED, PNEUMOCOCCAL   . PUD (peptic ulcer disease)   .  Renal colic   . Renal insufficiency   . Tourette's disorder     PAST SURGICAL HISTORY: Past Surgical History:  Procedure Laterality Date  . ABDOMINAL  HYSTERECTOMY    . Bladder tack    . EYE SURGERY      FAMILY HISTORY: Family History  Problem Relation Age of Onset  . Heart disease      No family history    SOCIAL HISTORY: Social History   Social History  . Marital status: Legally Separated    Spouse name: N/A  . Number of children: 2  . Years of education: N/A   Occupational History  .      Unemployed   Social History Main Topics  . Smoking status: Current Every Day Smoker    Packs/day: 0.01    Types: Cigarettes  . Smokeless tobacco: Never Used  . Alcohol use 0.0 oz/week     Comment: occasionally  . Drug use: No     Comment: Previous use of marijuana and cocaine  . Sexual activity: Not on file   Other Topics Concern  . Not on file   Social History Narrative   Patient is unemployed. Graduated from equivalency degree.   Divorced.     PHYSICAL EXAM  Vitals:   07/26/16 1112  BP: 116/77  Pulse: 61  Resp: 20  Weight: 187 lb (84.8 kg)  Height: 5' (1.524 m)   Body mass index is 36.52 kg/m.  PHYSICAL EXAMNIATION:  Gen: NAD, conversant, well nourised, obese, well groomed                     Cardiovascular: Regular rate rhythm, no peripheral edema, warm, nontender. Eyes: Conjunctivae clear without exudates or hemorrhage Neck: Supple, no carotid bruise. Pulmonary: Clear to auscultation bilaterally   NEUROLOGICAL EXAM:  MENTAL STATUS: Speech:    Speech is normal; fluent and spontaneous with normal comprehension.  Cognition:    The patient is oriented to person, place, and time;     recent and remote memory intact;     language fluent;     normal attention, concentration,     fund of knowledge.  CRANIAL NERVES: CN II: Visual fields are full to confrontation.Pupils were equal round reactive to light CN III, IV, VI: extraocular movement are  normal. No ptosis. CN V: Facial sensation is intact to pinprick in all 3 divisions bilaterally. Corneal responses are intact.  CN VII: Face is symmetric with normal eye closure and smile. CN VIII: Hearing is normal to rubbing fingers CN IX, X: Palate elevates symmetrically. Phonation is normal. CN XI: Head turning and shoulder shrug are intact CN XII: Tongue is midline with normal movements and no atrophy.  MOTOR: There is no pronator drift of out-stretched arms. Muscle bulk and tone are normal. Muscle strength is normal.  REFLEXES: Reflexes are 2+ and symmetric at the biceps, triceps, knees, and ankles. Plantar responses are flexor.  SENSORY: Light touch, pinprick, position sense, and vibration sense are intact in fingers and toes.  COORDINATION: Rapid alternating movements and fine finger movements are intact. There is no dysmetria on finger-to-nose and heel-knee-shin.   GAIT/STANCE: Posture is normal. Gait is steady with normal steps, base, arm swing, and turning. Heel and toe walking are normal. Tandem gait is normal.  Romberg is absent.   DIAGNOSTIC DATA (LABS, IMAGING, TESTING) -ASSESSMENT AND PLAN  46 y.o. year old female  has a past medical history of chronic migraines, failed multiple preventative medications in the past, including Topamax, Depakote, Lyrica, Neurontin, nortriptyline, over the past 6 months, she has headaches daily, last all day long, often exacerbated to a more severe migraine headaches,  Chronic migraine  Will try Botox injection as preventive medications  I have asked her to taper off daily analgesic use.  Maxalt dissolvable as needed   Tourette  Increase fluoxetine from 20 to 40 mg every day   Continue follow-up with her psychologist,    Levert Feinstein, M.D. Ph.D.  [facility] [address] [place], [place] [address] Phone: [phone] Fax:      [phone]

## 2016-07-26 NOTE — Telephone Encounter (Signed)
Please call pt to schedule a Botox appt.

## 2016-07-26 NOTE — Telephone Encounter (Signed)
I called and scheduled the patient.  °

## 2016-07-31 ENCOUNTER — Other Ambulatory Visit: Payer: Self-pay | Admitting: *Deleted

## 2016-07-31 MED ORDER — FLUOXETINE HCL 40 MG PO CAPS: 40.0000 mg | ORAL_CAPSULE | Freq: Every day | ORAL | 3 refills | Status: AC

## 2016-08-22 ENCOUNTER — Ambulatory Visit: Payer: Medicaid Other | Admitting: Neurology

## 2016-08-27 NOTE — Telephone Encounter (Signed)
Patient called office in reference to rescheduling BOTOX due to being in the hospital.  Please call.

## 2016-09-03 NOTE — Telephone Encounter (Signed)
Pt calling to be scheduled for her Botox, she said she has left a message and has not heard anything.  Please contact to schedule Botox.

## 2016-09-04 NOTE — Telephone Encounter (Signed)
I called and scheduled the patient.  °

## 2016-09-23 ENCOUNTER — Telehealth: Payer: Self-pay | Admitting: Neurology

## 2016-09-23 DIAGNOSIS — G43009 Migraine without aura, not intractable, without status migrainosus: Secondary | ICD-10-CM

## 2016-09-23 MED ORDER — BUTORPHANOL TARTRATE 10 MG/ML NA SOLN: 1.0000 | Freq: Four times a day (QID) | NASAL | 1 refills | Status: AC | PRN

## 2016-09-23 NOTE — Telephone Encounter (Signed)
Pt called in stating that she has had bad headache for the last 3 days. She has tried Scientist, clinical (histocompatibility and immunogenetics)maxalt but not effective. She is using ice pack and was working before but not working this time. She was using butorphanol nasal spray or imitrex subq, both had worked before. She does not have either of them now.   I told her that imitrex subq may need prior auth, it is hard for her to get over the weekend. I prescribed her butorphanol nasal spray and she is able to get it from pharmacy. I asked her to call back Monday to talk to Dr. Terrace ArabiaYan to see if her botox injection can be done earlier. She expressed understanding and appreciation.  Marvel PlanJindong Donnavin Vandenbrink, MD PhD Stroke Neurology 09/23/2016 11:32 AM  Meds ordered this encounter  Medications  . butorphanol (STADOL) 10 MG/ML nasal spray    Sig: Place 1 spray into the nose every 6 (six) hours as needed for headache.    Dispense:  2.5 mL    Refill:  1

## 2016-09-24 NOTE — Telephone Encounter (Signed)
Please check with patient about her headache  If we can, to bring her for Botox injection as migraine prevention soon, it is ok to add on Wednesdays getting

## 2016-09-25 ENCOUNTER — Other Ambulatory Visit: Payer: Self-pay | Admitting: *Deleted

## 2016-09-25 MED ORDER — SUMATRIPTAN SUCCINATE 6 MG/0.5ML ~~LOC~~ SOLN: SUBCUTANEOUS | 5 refills | Status: DC

## 2016-09-25 MED ORDER — SUMATRIPTAN SUCCINATE 6 MG/0.5ML ~~LOC~~ SOAJ: SUBCUTANEOUS | 5 refills | Status: DC

## 2016-09-25 NOTE — Telephone Encounter (Signed)
Spoke to patient - she was able to fill the prescription for Stadol nasal spray and this has resolved her migraine.  She normally alternates between rizatriptan tablets and sumatriptan injections.  Her Botox appt has been moved up to 10/03/16.

## 2016-09-27 ENCOUNTER — Other Ambulatory Visit: Payer: Self-pay | Admitting: *Deleted

## 2016-09-27 MED ORDER — "SYRINGE/NEEDLE (DISP) 20G X 1"" 3 ML MISC": 5 refills | Status: DC

## 2016-09-27 MED ORDER — "NEEDLE (DISP) 23G X 1-1/4"" MISC": 5 refills | Status: AC

## 2016-10-01 ENCOUNTER — Telehealth: Payer: Self-pay | Admitting: Neurology

## 2016-10-01 NOTE — Telephone Encounter (Signed)
Medicaid has denied the request for Botox due to the lack of 3 drug classes being tried for three months or more. I called the patient to make her aware and she states that she has been on three drug classes. She wants to know what the antidepressant is that she has previously tried. She says it starts with an S. She says that medicaid approved her for these injections about 5 years ago and wants to know what medication we are missing now. Please advise.

## 2016-10-01 NOTE — Telephone Encounter (Signed)
She has tried and failed  Topamax, Depakote, Lyrica, Nortriptyline, propranolol  She responded very well to previous Botox injection as migraine prevention, please re- negotiated with her insurance

## 2016-10-02 MED ORDER — DILTIAZEM HCL ER COATED BEADS 120 MG PO TB24
120.0000 mg | ORAL_TABLET | Freq: Every day | ORAL | 6 refills | Status: DC
Start: 2016-10-02 — End: 2016-10-08

## 2016-10-02 NOTE — Telephone Encounter (Addendum)
She has tried and failed nortriptyline, which is tricyclic antidepression, Could not tolerate beta blocker, propanolol  I have started calcium channel blocker Cardizem ER 120 mg daily, at her next follow-up visit in 1 month's if she fail, will try to reinitiate the process,  Please call give her an appt with NP in one month

## 2016-10-02 NOTE — Telephone Encounter (Addendum)
Pt has appt in one month with NP for medication management.

## 2016-10-02 NOTE — Telephone Encounter (Signed)
[facility] stated that this did not meet the guidelines for their requirements.

## 2016-10-02 NOTE — Telephone Encounter (Signed)
Has she tried a tricyclic anti depressant or a calcium channel blocker? These are the two drug classes we are missing.

## 2016-10-02 NOTE — Addendum Note (Signed)
Addended by: Levert FeinsteinYAN, Karen Huhta on: 10/02/2016 02:07 PM   Modules accepted: Orders

## 2016-10-02 NOTE — Telephone Encounter (Signed)
Is there a way for me to appeal

## 2016-10-03 ENCOUNTER — Ambulatory Visit: Payer: Self-pay | Admitting: Neurology

## 2016-10-03 NOTE — Telephone Encounter (Signed)
Noted, thank you

## 2016-10-04 ENCOUNTER — Telehealth: Payer: Self-pay | Admitting: Neurology

## 2016-10-04 NOTE — Telephone Encounter (Signed)
Sonna with [facility] called office in reference to diltiazem (CARDIZEM LA) 120 MG 24 hr tablet.  Wanting to know if the medication can be switched Cardizem CD 120 MG 24 hr (generic) due to diltiazem (CARDIZEM LA) 120 MG 24 hr tablet needing a prior authorization.  Please call.

## 2016-10-05 NOTE — Telephone Encounter (Signed)
Ok to change to cardizem CD 120mg  24 hours generic

## 2016-10-08 ENCOUNTER — Other Ambulatory Visit: Payer: Self-pay | Admitting: *Deleted

## 2016-10-08 MED ORDER — DILTIAZEM HCL ER COATED BEADS 120 MG PO CP24: 120.0000 mg | ORAL_CAPSULE | Freq: Every day | ORAL | 11 refills | Status: DC

## 2016-10-08 MED ORDER — DILTIAZEM HCL ER COATED BEADS 120 MG PO CP24: 120.0000 mg | ORAL_CAPSULE | Freq: Every day | ORAL | 11 refills | Status: AC

## 2016-10-08 NOTE — Telephone Encounter (Signed)
Rx has been changed and sent to pharmacy.  

## 2016-10-15 ENCOUNTER — Telehealth: Payer: Self-pay | Admitting: Neurology

## 2016-10-15 NOTE — Telephone Encounter (Signed)
Domenica ReamerLatasia from [facility] calling for Prior Auth on Botox Prior Auth# [id number] her direct line is [phone]

## 2016-10-17 ENCOUNTER — Telehealth: Payer: Self-pay | Admitting: Neurology

## 2016-10-17 NOTE — Telephone Encounter (Signed)
[name] for Botox, prior auth#[id number]

## 2016-10-17 NOTE — Telephone Encounter (Signed)
Patient was denied through medicaid.

## 2016-10-18 NOTE — Telephone Encounter (Signed)
Please refer to previous phone note. Patient has been denied through medicaid. I called [facility] back and made them aware.

## 2016-10-22 NOTE — Telephone Encounter (Signed)
error 

## 2016-11-06 ENCOUNTER — Ambulatory Visit: Payer: Medicaid Other | Admitting: Nurse Practitioner

## 2016-11-07 ENCOUNTER — Ambulatory Visit: Payer: Medicaid Other | Admitting: Neurology

## 2017-05-30 ENCOUNTER — Other Ambulatory Visit: Payer: Self-pay | Admitting: Neurology

## 2017-07-01 ENCOUNTER — Telehealth: Payer: Self-pay | Admitting: Neurology

## 2017-07-01 NOTE — Telephone Encounter (Addendum)
Spoke to patient - she has noticed a recent increase in migraines that she feels is related to her worsening neck pain.  She is having mild relief with sumatriptan.  Rizatriptan has not been helpful at all.  She has tried beta-blockers, topiramate, divalproex, nortriptyline in the past.  She has had previous success with Botox but Medicaid denied her coverage last year.  She would like to attempt coverage again this year or discuss another preventive option.  She has been scheduled for a follow up this week.

## 2017-07-01 NOTE — Telephone Encounter (Signed)
Patient is calling stating she has had a migraine for 4 days. She has taken SUMAtriptan (IMITREX) 6 MG/0.5ML SOLN injection and it helps some but headache comes back. She is not sure what to do. She was going to take Botox injections but Medicaid has denied. Please call to discuss.

## 2017-07-03 ENCOUNTER — Ambulatory Visit: Payer: Self-pay | Admitting: Neurology

## 2017-07-03 ENCOUNTER — Telehealth: Payer: Self-pay | Admitting: *Deleted

## 2017-07-03 NOTE — Telephone Encounter (Signed)
Patient called at 10:26am to cancel noon appt.  Stated she double booked appts and could not come in today.

## 2017-07-04 ENCOUNTER — Encounter: Payer: Self-pay | Admitting: Neurology

## 2017-07-10 ENCOUNTER — Other Ambulatory Visit: Payer: Self-pay | Admitting: Neurology

## 2017-08-15 ENCOUNTER — Ambulatory Visit: Payer: Self-pay | Admitting: Podiatry

## 2017-08-15 ENCOUNTER — Ambulatory Visit: Payer: Medicaid Other | Admitting: Podiatry

## 2017-08-15 ENCOUNTER — Encounter: Payer: Self-pay | Admitting: Podiatry

## 2017-08-15 VITALS — Ht 60.0 in | Wt 205.0 lb

## 2017-08-15 DIAGNOSIS — M2042 Other hammer toe(s) (acquired), left foot: Secondary | ICD-10-CM | POA: Diagnosis not present

## 2017-08-15 DIAGNOSIS — M79672 Pain in left foot: Secondary | ICD-10-CM

## 2017-08-15 DIAGNOSIS — L851 Acquired keratosis [keratoderma] palmaris et plantaris: Secondary | ICD-10-CM

## 2017-08-15 DIAGNOSIS — M7752 Other enthesopathy of left foot: Secondary | ICD-10-CM

## 2017-08-15 NOTE — Patient Instructions (Addendum)
Seen for pain in 2nd and 3rd toe left. Noted of loss of protective fat layer and pressure for distal end of toe bone in X-ray. Skin lesion debrided and dressed. Return in 2 weeks to further investigate the condition.

## 2017-08-15 NOTE — Progress Notes (Signed)
SUBJECTIVE: 47 y.o. year old female presents complaining of pain and burning toe 2nd and 3rd left foot.  Became extremely painful since last December 2018. Initial pain started 6 years ago. Her last visit to this office was 11/17/14 and treated for Microangiopathy. Patient is referred by Dr. Julio Sickssei-Bonsu.   Diagnosed with PVD, PAD, Tourette's disease, plantar fasciitis right, Ischemic ulcer right foot.  Review of Systems  Constitutional: Negative.   HENT: Negative.   Eyes: Negative.   Respiratory: Negative for cough, shortness of breath and wheezing.        Treated for Pulmonary condition, has had aspiration problem.  Cardiovascular: Negative.   Gastrointestinal: Negative for heartburn, nausea and vomiting.       Treated for digestive problem.  Genitourinary: Negative.   Musculoskeletal: Negative.   Skin: Negative.      OBJECTIVE: DERMATOLOGIC EXAMINATION: Loss of toe nails 2nd and 3rd toe left. Digital keratotic lesion distal end 2nd and 3rd toe left.  VASCULAR EXAMINATION OF LOWER LIMBS: All pedal pulses are palpable both DP and PT left foot. Capillary Filling times within 3 seconds in all digits.  No edema or erythema noted. Temperature gradient from tibial crest to dorsum of foot is within normal bilateral.  NEUROLOGIC EXAMINATION OF THE LOWER LIMBS: All epicritic and tactile sensations grossly intact. Sharp and Dull discriminatory sensations at the plantar ball of hallux is intact bilateral.   MUSCULOSKELETAL EXAMINATION: Positive for hard distal stump 2nd and 3rd left foot.  RADIOGRAPHIC STUDIES:  AP View:  Loss of fat pad distal end 2nd and 3rd toe left. Distal phalangeal bone extending close to skin layer 2nd and 3rd left. Spurring of distal phalanx 2nd and 3rd left. Lateral view:  No abnormal acute changes noted.   ASSESSMENT: Deformed toe 2nd and 3rd with loss of protective adipose layer left foot. Distal keratotic lesion 2nd and 3rd left. Bone spur 2nd and  3rd toe left.  PLAN: Reviewed findings and available treatment options. Left foot soaked and debrided. Amerigel ointment dressing applied. Return in 2 weeks. Possible surgical option to reduce excess bone mass at distal end of 2nd toe left foot.

## 2017-08-29 ENCOUNTER — Ambulatory Visit: Payer: Medicaid Other | Admitting: Podiatry

## 2017-08-29 DIAGNOSIS — M2042 Other hammer toe(s) (acquired), left foot: Secondary | ICD-10-CM | POA: Diagnosis not present

## 2017-08-29 DIAGNOSIS — M7752 Other enthesopathy of left foot: Secondary | ICD-10-CM

## 2017-08-29 DIAGNOSIS — M79672 Pain in left foot: Secondary | ICD-10-CM

## 2017-08-29 NOTE — Patient Instructions (Signed)
Follow up on pain in left 2-4. Reviewed available treatment options. Surgical option reviewed, removal of distal phalangeal bone to reduce bone pressure to the end of toe.

## 2017-08-29 NOTE — Progress Notes (Signed)
SUBJECTIVE: 47 y.o. year old female presents to discuss possible surgical options on the painful toes 2-4 left foot. Stated that last visit helped reducing her pain. The hard lesions were debrided. Taking Neurontin for pain. Most extreme pain comes first thing in the morning as getting out of bed. Still need to take 4 Neurontin pills a day. Been on Dilaudid 2 mg 2-3 times a day for pain management. Became extremely painful since last December 2018. Initial pain started 6 years ago. 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 Also diagnosed PVD, PAD, Tourette's disease, plantar fasciitis right, post Ischemic ulcer right foot.  Review of Systems: Constitutional: Negative.   HENT: Negative.   Eyes: Negative.   Respiratory: Negative for cough, shortness of breath and wheezing.        Treated for Pulmonary condition, has had aspiration problem.  Cardiovascular: Negative.   Gastrointestinal: Negative for heartburn, nausea and vomiting.       Treated for digestive problem.  Genitourinary: Negative.   Musculoskeletal: Negative.   Skin: Negative.     OBJECTIVE findings have no change since last visit: DERMATOLOGIC EXAMINATION: Loss of toe nails 2nd and 3rd toe left. Digital keratotic lesion distal end 2nd and 3rd toe left.  VASCULAR EXAMINATION OF LOWER LIMBS: All pedal pulses are palpable both DP and PT left foot. Capillary Filling times within 3 seconds in all digits.  No edema or erythema noted. Temperature gradient from tibial crest to dorsum of foot is within normal bilateral.  NEUROLOGIC EXAMINATION OF THE LOWER LIMBS: All epicritic and tactile sensations grossly intact. Sharp and Dull discriminatory sensations at the plantar ball of hallux is intact bilateral.   MUSCULOSKELETAL EXAMINATION: Positive for hard distal stump 2nd and 3rd left foot.   ASSESSMENT: Deformed toe 2nd and 3rd with loss of protective adipose layer left foot. Distal  keratotic lesion 2nd and 3rd left. Bone spur 2nd and 3rd toe left.  PLAN: Reviewed findings and available treatment options. Surgery consent form reviewed for removal of distal 1/2 of distal phalanx 2nd toe left. If healed without complication may proceed with 3rd and 4th toe surgery.

## 2017-09-02 ENCOUNTER — Encounter: Payer: Self-pay | Admitting: Podiatry

## 2017-09-03 ENCOUNTER — Other Ambulatory Visit: Payer: Self-pay | Admitting: Neurology

## 2017-09-05 ENCOUNTER — Other Ambulatory Visit: Payer: Self-pay | Admitting: Podiatry

## 2017-09-05 MED ORDER — OXYCODONE-ACETAMINOPHEN 7.5-325 MG PO TABS: 1.0000 | ORAL_TABLET | Freq: Four times a day (QID) | ORAL | 0 refills | Status: AC | PRN

## 2017-09-06 DIAGNOSIS — M79672 Pain in left foot: Secondary | ICD-10-CM

## 2017-09-06 DIAGNOSIS — M25775 Osteophyte, left foot: Secondary | ICD-10-CM | POA: Diagnosis not present

## 2017-09-11 ENCOUNTER — Encounter: Payer: Self-pay | Admitting: Podiatry

## 2017-09-11 ENCOUNTER — Ambulatory Visit (INDEPENDENT_AMBULATORY_CARE_PROVIDER_SITE_OTHER): Payer: Medicaid Other | Admitting: Podiatry

## 2017-09-11 DIAGNOSIS — M2042 Other hammer toe(s) (acquired), left foot: Secondary | ICD-10-CM

## 2017-09-11 NOTE — Patient Instructions (Signed)
Normal post op wound healing with good skin color and dry incision line. Dressing changed. Return in 1 week.

## 2017-09-11 NOTE — Progress Notes (Signed)
S/P Removal of distal phalangeal bone 2nd toe left foot 09/06/17. Had more pain yesterday. Denies any severe discomfort. Pain was similar to the one she had before the sugery. Wound is clean and dry well approximated. Minimum edema noted. Dressing changed with Betadine and Amerigel ointment dressing. Return in one week.

## 2017-09-19 ENCOUNTER — Encounter: Payer: Self-pay | Admitting: Podiatry

## 2017-09-19 ENCOUNTER — Ambulatory Visit (INDEPENDENT_AMBULATORY_CARE_PROVIDER_SITE_OTHER): Payer: Medicaid Other | Admitting: Podiatry

## 2017-09-19 DIAGNOSIS — M2042 Other hammer toe(s) (acquired), left foot: Secondary | ICD-10-CM

## 2017-09-19 NOTE — Patient Instructions (Signed)
2 weeks follow up on 2nd toe left. Wound healing normal. Will wait for another week before suture removal. Continue to keep the wound dry return in one week.

## 2017-09-19 NOTE — Progress Notes (Signed)
2 weeks S/P Removal of distal phalangeal bone 2nd toe left foot 09/06/17. Stated that she is not having any more pain on the toe like she had before the surgery. Wound healing normal. Will wait for another week before suture removal.  Continue to keep the wound dry return in one week.

## 2017-09-26 ENCOUNTER — Ambulatory Visit (INDEPENDENT_AMBULATORY_CARE_PROVIDER_SITE_OTHER): Payer: Medicaid Other | Admitting: Podiatry

## 2017-09-26 ENCOUNTER — Encounter: Payer: Self-pay | Admitting: Podiatry

## 2017-09-26 DIAGNOSIS — M7752 Other enthesopathy of left foot: Secondary | ICD-10-CM

## 2017-09-26 NOTE — Patient Instructions (Signed)
3 weeks post op wound healing normal. Suture removed. Mefix tape placed. May replace as needed with extra tape that was dispensed. Return as needed.

## 2017-09-26 NOTE — Progress Notes (Signed)
S/P Removal of distal phalangeal bone 2nd toe left foot 09/06/17. Wound healing normal. Denies any discomfort. All sutures removed. Mefix tape placed with home care instruction. Return as needed.

## 2017-11-04 ENCOUNTER — Encounter: Payer: Self-pay | Admitting: Neurology

## 2017-11-06 ENCOUNTER — Other Ambulatory Visit: Payer: Self-pay | Admitting: Neurology

## 2017-12-26 ENCOUNTER — Encounter: Payer: Self-pay | Admitting: Neurology

## 2017-12-26 NOTE — Progress Notes (Deleted)
NEUROLOGY CONSULTATION NOTE  [name] MRN: [id number] DOB: 1971-02-02  Referring provider: Norva Riffle, PA Primary care provider: Jackie Plum, MD  Reason for consult:  migraines  HISTORY OF PRESENT ILLNESS: [name] is a 47 year old ***-handed female with COPD, CAD, IBS, depression,Tourette's, and history of kidney stones and substance abuse who presents for migraine.  History supplemented by prior neurologist's and referring provider's notes.  Onset:  47 years old Location:  *** Quality:  pounding Intensity:  Severe.  She denies new headache, thunderclap headache or severe headache that wakes her from sleep. Aura:  *** Prodrome:  *** Postdrome:  *** Associated symptoms:  Nausea, vomiting, photophobia, phonophobia, osmophobia.  She denies associated *** unilateral numbness or weakness. Duration:  *** Frequency:  *** Frequency of abortive medication: *** Triggers:  *** Exacerbating factors:  *** Relieving factors:  *** Activity:  ***  Current NSAIDS:  *** Current analgesics:  Percocet, Stadol Current triptans:  Sumatriptan 6mg  [place] Current ergotamine:  *** Current anti-emetic:  Promethazine 25mg  Current muscle relaxants:  *** Current anti-anxiolytic:  diazepam Current sleep aide:  *** Current Antihypertensive medications:  Amlodipine 10mg , HCTZ Current Antidepressant medications:  Prozac 40mg  Current Anticonvulsant medications:  Gabapentin 300mg  four times daly Current anti-CGRP:  *** Current Vitamins/Herbal/Supplements:  *** Current Antihistamines/Decongestants:  *** Other therapy:  *** Other medication:  Lithium carbonate 300mg   Past NSAIDS:  Ibuprofen, naproxen Past analgesics:  Excedrin Migraine Past abortive triptans:  Maxalt-MLT 10mg  Past abortive ergotamine:  *** Past muscle relaxants:  Baclofen, Flexeril, Robaxin, Zanflex Past anti-emetic:  *** Past antihypertensive medications:  Propranolol ER 60mg  Past antidepressant medications:   Nortriptyline 50mg  Past anticonvulsant medications:  Topiramate, Depakote, Lyrica Past anti-CGRP:  *** Past vitamins/Herbal/Supplements:  *** Past antihistamines/decongestants:  *** Other past therapies:  Botox  Caffeine:  *** Alcohol:  *** Smoker:  *** Diet:  *** Exercise:  *** Depression:  ***; Anxiety:  *** Other pain:  *** Sleep hygiene:  *** Family history of headache:  ***  12/31/10 CT Head w/o personally reviewed which was limited due to motion artifact but overall unremarkable.  PAST MEDICAL HISTORY: Past Medical History:  Diagnosis Date  . ACID REFLUX DISEASE   . ALCOHOL ABUSE, HX OF   . ANEMIA OF CHRONIC DISEASE   . Asthma   . BACK PAIN   . BLADDER SUSPENSION, HX OF   . 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 CANDIDIASIS   . COPD (chronic obstructive pulmonary disease) (HCC)   . Coronary artery disease   . DEGENERATIVE DISC DISEASE   . DEPRESSION   . Diabetes mellitus   . DRUG ABUSE, HX OF   . HEMORRHOIDS, INTERNAL   . INSOMNIA, CHRONIC   . Irritable bowel syndrome   . MENOPAUSE, SURGICAL   . Migraine   . Nephrolithiasis   . PERSONAL HX OF METHICILLIN RESIST STAPH AUREUS   . PNEUMONIA, COMMUNITY ACQUIRED, PNEUMOCOCCAL   . PUD (peptic ulcer disease)   . Renal colic   . Renal insufficiency   . Tourette's disorder     PAST SURGICAL HISTORY: Past Surgical History:  Procedure Laterality Date  . ABDOMINAL HYSTERECTOMY    . Bladder tack    . EYE SURGERY      MEDICATIONS: Current Outpatient Medications on File Prior to Visit  Medication Sig Dispense Refill  . albuterol (PROVENTIL HFA;VENTOLIN HFA) 108 (90 BASE) MCG/ACT inhaler Inhale 2 puffs into the lungs every 6 (six) hours as needed. For shortness breath    .  albuterol (PROVENTIL) (2.5 MG/3ML) 0.083% nebulizer solution 2.5 mg.    . amLODipine (NORVASC) 10 MG tablet Take 10 mg by mouth.    . Biotin 1 MG CAPS Take 10,000 mg by mouth.    . butorphanol (STADOL) 10 MG/ML nasal spray Place 1 spray into the  nose every 6 (six) hours as needed for headache. 2.5 mL 1  . cholecalciferol (VITAMIN D) 1000 UNITS tablet Take 400 Units by mouth.    . diazepam (VALIUM) 5 MG tablet Take 5 mg by mouth 3 (three) times daily.    Marland Kitchen diltiazem (CARDIZEM CD) 120 MG 24 hr capsule Take 1 capsule (120 mg total) by mouth daily. 30 capsule 11  . docusate sodium (COLACE) 100 MG capsule Take 200 mg by mouth.    . esomeprazole (NEXIUM) 40 MG capsule Take 40 mg by mouth 2 (two) times daily.    . ferrous fumarate (HEMOCYTE - 106 MG FE) 325 (106 FE) MG TABS Take 2 tablets by mouth daily.     . ferrous sulfate 325 (65 FE) MG tablet Take 1 tablet by mouth.    Marland Kitchen FLUoxetine (PROZAC) 40 MG capsule Take 1 capsule (40 mg total) by mouth daily. 90 capsule 3  . gabapentin (NEURONTIN) 300 MG capsule Take 300 mg by mouth 4 (four) times daily.     . hydrochlorothiazide (HYDRODIURIL) 25 MG tablet 1 mg.    . iron polysaccharides (NU-IRON) 150 MG capsule Frequency:   Dosage:0   MG  Instructions:  Note:TAKE 1 CAPSULE BY MOUTH TWICE DAILY    . levothyroxine (SYNTHROID, LEVOTHROID) 25 MCG tablet Take 25 mcg by mouth.    . lithium carbonate 300 MG capsule     . loperamide (IMODIUM) 2 MG capsule Take 2 mg by mouth 4 (four) times daily as needed. For diarrhea    . Multiple Vitamins-Minerals (THERA-M) TABS Take 1 tablet by mouth.    Marland Kitchen NEEDLE, DISP, 23 G (B-D DISP NEEDLE 23GX1-1/4") 23G X 1-1/4" MISC After drawing up sumatriptan solution, change to this needle for injection. 12 each 5  . omeprazole (PRILOSEC) 20 MG capsule     . oxyCODONE-acetaminophen (PERCOCET) 7.5-325 MG tablet Take 1 tablet by mouth every 6 (six) hours as needed. 28 tablet 0  . OXYGEN Inhale into the lungs. 2L per min    . polyethylene glycol (MIRALAX / GLYCOLAX) packet Take 17 g by mouth as needed.    . potassium chloride (K-DUR) 10 MEQ tablet Take 10 mEq by mouth.    . promethazine (PHENERGAN) 25 MG/ML injection Inject 25 mg into the muscle.    .  pseudoephedrine-acetaminophen (TYLENOL SINUS) 30-500 MG TABS Take 2 tablets by mouth every 4 (four) hours as needed. Sinus pressure    . ranitidine (ZANTAC) 150 MG tablet Take 150 mg by mouth at bedtime.    . rizatriptan (MAXALT-MLT) 10 MG disintegrating tablet Take 1 tablet (10 mg total) by mouth as needed. May repeat in 2 hours if needed 15 tablet 6  . SUMAtriptan (IMITREX) 6 MG/0.5ML SOLN injection INJECT AT ONSET OF MIGRAINE. MAY REPEAT IN 2 HOURS IF PAIN PERSISTS** MAX:2 DOSES/24 HR 12 vial 0  . SYRINGE-NEEDLE, DISP, 3 ML (B-D 3CC LUER-LOK SYR 23GX1") 23G X 1" 3 ML MISC USE TO DRAW UP AND INJECT SUMATRIPTAN SOLUTION 12 each 4   Current Facility-Administered Medications on File Prior to Visit  Medication Dose Route Frequency Provider Last Rate Last Dose  . valproate (DEPACON) 1,000 mg in sodium chloride 0.9 % 100  mL IVPB  1,000 mg Intravenous Continuous Levert Feinstein, MD   Stopped at 03/18/13 [id number]    ALLERGIES: Allergies  Allergen Reactions  . Ciprofloxacin Hives    REACTION: Hives  . Diphenhydramine     Other reaction(s): Hallucinations  . Diphenhydramine Hcl Other (See Comments)  . Diphenhydramine Hcl     Other reaction(s): DELIRIUM  . Divalproex Sodium Swelling    Other reaction(s): Other swelling  . Heparin Other (See Comments)    Other reaction(s): HYPERTENSION,SWELLING Raises BP and swells  . Ketorolac     Other reaction(s): URINARY RETENTION  . Ketorolac Tromethamine Other (See Comments)    Bladder Retention Bladder retention Bladder retintion  . Nefazodone   . Other     Other reaction(s): Other (See Comments) Per [facility] Other reaction(s): Agitation  . Sertraline   . Tramadol Other (See Comments)    Bladder retention  . Trazodone   . Trazodone And Nefazodone     Other reaction(s): Other hyperactive  Not agree with herTourette's   . Valproic Acid Swelling    Other reaction(s): SWELLING Other reaction(s): Facial Edema (intolerance)    FAMILY HISTORY: Family  History  Problem Relation Age of Onset  . Heart disease Unknown        No family history   ***.  SOCIAL HISTORY: Social History   Socioeconomic History  . Marital status: Legally Separated    Spouse name: Not on file  . Number of children: 2  . Years of education: Not on file  . Highest education level: Not on file  Occupational History    Comment: Unemployed  Social Needs  . Financial resource strain: Not on file  . Food insecurity:    Worry: Not on file    Inability: Not on file  . Transportation needs:    Medical: Not on file    Non-medical: Not on file  Tobacco Use  . Smoking status: Current Every Day Smoker    Packs/day: 0.01    Types: Cigarettes  . Smokeless tobacco: Never Used  Substance and Sexual Activity  . Alcohol use: Yes    Alcohol/week: 0.0 standard drinks    Comment: occasionally  . Drug use: No    Types: Marijuana    Comment: Previous use of marijuana and cocaine  . Sexual activity: Not on file  Lifestyle  . Physical activity:    Days per week: Not on file    Minutes per session: Not on file  . Stress: Not on file  Relationships  . Social connections:    Talks on phone: Not on file    Gets together: Not on file    Attends religious service: Not on file    Active member of club or organization: Not on file    Attends meetings of clubs or organizations: Not on file    Relationship status: Not on file  . Intimate partner violence:    Fear of current or ex partner: Not on file    Emotionally abused: Not on file    Physically abused: Not on file    Forced sexual activity: Not on file  Other Topics Concern  . Not on file  Social History Narrative   Patient is unemployed. Graduated from equivalency degree.   Divorced.    REVIEW OF SYSTEMS: Constitutional: No fevers, chills, or sweats, no generalized fatigue, change in appetite Eyes: No visual changes, double vision, eye pain Ear, nose and throat: No hearing loss, ear pain, nasal congestion,  sore throat  Cardiovascular: No chest pain, palpitations Respiratory:  No shortness of breath at rest or with exertion, wheezes GastrointestinaI: No nausea, vomiting, diarrhea, abdominal pain, fecal incontinence Genitourinary:  No dysuria, urinary retention or frequency Musculoskeletal:  No neck pain, back pain Integumentary: No rash, pruritus, skin lesions Neurological: as above Psychiatric: No depression, insomnia, anxiety Endocrine: No palpitations, fatigue, diaphoresis, mood swings, change in appetite, change in weight, increased thirst Hematologic/Lymphatic:  No purpura, petechiae. Allergic/Immunologic: no itchy/runny eyes, nasal congestion, recent allergic reactions, rashes  PHYSICAL EXAM: *** General: No acute distress.  Patient appears ***-groomed.  *** Head:  Normocephalic/atraumatic Eyes:  fundi examined but not visualized Neck: supple, no paraspinal tenderness, full range of motion Back: No paraspinal tenderness Heart: regular rate and rhythm Lungs: Clear to auscultation bilaterally. Vascular: No carotid bruits. Neurological Exam: Mental status: alert and oriented to person, place, and time, recent and remote memory intact, fund of knowledge intact, attention and concentration intact, speech fluent and not dysarthric, language intact. Cranial nerves: CN I: not tested CN II: pupils equal, round and reactive to light, visual fields intact CN III, IV, VI:  full range of motion, no nystagmus, no ptosis CN V: facial sensation intact CN VII: upper and lower face symmetric CN VIII: hearing intact CN IX, X: gag intact, uvula midline CN XI: sternocleidomastoid and trapezius muscles intact CN XII: tongue midline Bulk & Tone: normal, no fasciculations. Motor:  5/5 throughout *** Sensation:  [place] *** temperature *** and vibration sensation intact.  ***. Deep Tendon Reflexes:  2+ throughout, *** toes downgoing.  *** Finger to nose testing:  Without dysmetria.  *** Heel to  shin:  Without dysmetria.  *** Gait:  Normal station and stride.  Able to turn and tandem walk. [place] ***.  IMPRESSION: ***  PLAN: ***  Thank you for allowing me to take part in the care of this patient.  Shon Millet, DO  CC: ***

## 2017-12-27 ENCOUNTER — Telehealth: Payer: Self-pay | Admitting: Neurology

## 2017-12-27 ENCOUNTER — Ambulatory Visit: Payer: Self-pay | Admitting: Neurology

## 2017-12-27 NOTE — Telephone Encounter (Signed)
Patient called stating that she needed to reschedule today's appointment due to family/personal issues. She has a horrible migraine though and wanted to know if there was anything that would help. Migraine started Monday evening. Please call her back at [phone]. Thanks!

## 2017-12-27 NOTE — Telephone Encounter (Signed)
Called and spoke with Pt, advised her until she is seen by [place], we are unable to make any recommendations

## 2018-01-17 NOTE — Progress Notes (Signed)
NEUROLOGY CONSULTATION NOTE  [name] MRN: [id number] DOB: May 21, 1970  Referring provider: Norva Riffle, PA Primary care provider: Jackie Plum, MD  Reason for consult:  migraines  HISTORY OF PRESENT ILLNESS: [name] is a 47 year old right-handed female with COPD, IBS, depression,Tourette's, peripheral arterial disease, and history of kidney stones and substance abuse who presents for migraine.  History supplemented by prior neurologist's and referring provider's notes.  Onset:  47 years old Location:  Mostly right sided, shoots from back of neck up to frontal region Quality:  pounding Intensity:  Severe.  She denies new headache, thunderclap headache or severe headache that wakes her from sleep. Aura:  no Prodrome:  no Postdrome:  no Associated symptoms:  Nausea, vomiting, photophobia, phonophobia, osmophobia.  She denies associated unilateral numbness or weakness. Duration:  4 to 5 days Frequency:  daily Frequency of abortive medication: several days a week Triggers/exacerbating factors:  Neck pain (Cervical DDD) Relieving factors:  no Activity:  aggravates  Current NSAIDS:  no Current analgesics:  Tylenol Sinus Current triptans:  Sumatriptan 6mg  [place] (causes head to burn, sometimes helps), Maxalt MLT 10mg  Current ergotamine:  no Current anti-emetic:  Promethazine 25mg  Current muscle relaxants:  Flexeril (dry mouth) Current anti-anxiolytic:  Diazepam (for muscle spasms) Current sleep aide:  none Current Antihypertensive medications:  Amlodipine 10mg , HCTZ Current Antidepressant medications:  Prozac 40mg  Current Anticonvulsant medications:  Gabapentin 300mg  twice times daly Current anti-CGRP:  none Current Vitamins/Herbal/Supplements:  no Current Antihistamines/Decongestants:  no Other therapy:  She was being treated by pain management for her neck but stopped because the practice closed. Other medication:  Lithium carbonate 300mg , Synthroid  Past  NSAIDS:  Ibuprofen, naproxen Past analgesics:  Excedrin Migraine, Stadol Past abortive triptans: no Past abortive ergotamine:  no Past muscle relaxants:  Baclofen, Flexeril, Robaxin, Zanflex Past anti-emetic:  no Past antihypertensive medications:  Propranolol ER 60mg  (hypotension) Past antidepressant medications:  Nortriptyline 50mg  Past anticonvulsant medications:  Topiramate, Depakote, Lyrica Past anti-CGRP:  no Past vitamins/Herbal/Supplements:  no Past antihistamines/decongestants:  no Other past therapies:  Botox (ineffective)  Caffeine:  1/2 cup coffee daily Alcohol:  no Smoker:  Yes (1-2 cigarettes a day) Diet:  Drinks water all day long.  No soda Exercise:  Not routine Depression:  yes; Anxiety:  yes Other pain:  Neck pain Sleep hygiene:  Not optimal.but overall okay. Family history of migraines:  Mom, sisters  12/31/10 CT Head w/o personally reviewed which was limited due to motion artifact but overall unremarkable.  PAST MEDICAL HISTORY: Past Medical History:  Diagnosis Date  . ACID REFLUX DISEASE   . ALCOHOL ABUSE, HX OF   . ANEMIA OF CHRONIC DISEASE   . Asthma   . BACK PAIN   . BLADDER SUSPENSION, HX OF   . 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 CANDIDIASIS   . COPD (chronic obstructive pulmonary disease) (HCC)   . Coronary artery disease   . DEGENERATIVE DISC DISEASE   . DEPRESSION   . Diabetes mellitus   . DRUG ABUSE, HX OF   . HEMORRHOIDS, INTERNAL   . INSOMNIA, CHRONIC   . Irritable bowel syndrome   . MENOPAUSE, SURGICAL   . Migraine   . Nephrolithiasis   . PERSONAL HX OF METHICILLIN RESIST STAPH AUREUS   . PNEUMONIA, COMMUNITY ACQUIRED, PNEUMOCOCCAL   . PUD (peptic ulcer disease)   . Renal colic   . Renal insufficiency   . Tourette's disorder     PAST SURGICAL HISTORY: Past Surgical  History:  Procedure Laterality Date  . ABDOMINAL HYSTERECTOMY    . Bladder tack    . EYE SURGERY      MEDICATIONS: Current Outpatient Medications on File  Prior to Visit  Medication Sig Dispense Refill  . albuterol (PROVENTIL HFA;VENTOLIN HFA) 108 (90 BASE) MCG/ACT inhaler Inhale 2 puffs into the lungs every 6 (six) hours as needed. For shortness breath    . albuterol (PROVENTIL) (2.5 MG/3ML) 0.083% nebulizer solution 2.5 mg.    . amLODipine (NORVASC) 10 MG tablet Take 10 mg by mouth.    . Biotin 1 MG CAPS Take 10,000 mg by mouth.    . butorphanol (STADOL) 10 MG/ML nasal spray Place 1 spray into the nose every 6 (six) hours as needed for headache. 2.5 mL 1  . cholecalciferol (VITAMIN D) 1000 UNITS tablet Take 400 Units by mouth.    . diazepam (VALIUM) 5 MG tablet Take 5 mg by mouth 3 (three) times daily.    Marland Kitchen diltiazem (CARDIZEM CD) 120 MG 24 hr capsule Take 1 capsule (120 mg total) by mouth daily. 30 capsule 11  . docusate sodium (COLACE) 100 MG capsule Take 200 mg by mouth.    . esomeprazole (NEXIUM) 40 MG capsule Take 40 mg by mouth 2 (two) times daily.    . ferrous fumarate (HEMOCYTE - 106 MG FE) 325 (106 FE) MG TABS Take 2 tablets by mouth daily.     . ferrous sulfate 325 (65 FE) MG tablet Take 1 tablet by mouth.    Marland Kitchen FLUoxetine (PROZAC) 40 MG capsule Take 1 capsule (40 mg total) by mouth daily. 90 capsule 3  . gabapentin (NEURONTIN) 300 MG capsule Take 300 mg by mouth 4 (four) times daily.     . hydrochlorothiazide (HYDRODIURIL) 25 MG tablet 1 mg.    . iron polysaccharides (NU-IRON) 150 MG capsule Frequency:   Dosage:0   MG  Instructions:  Note:TAKE 1 CAPSULE BY MOUTH TWICE DAILY    . levothyroxine (SYNTHROID, LEVOTHROID) 25 MCG tablet Take 25 mcg by mouth.    . lithium carbonate 300 MG capsule     . loperamide (IMODIUM) 2 MG capsule Take 2 mg by mouth 4 (four) times daily as needed. For diarrhea    . Multiple Vitamins-Minerals (THERA-M) TABS Take 1 tablet by mouth.    Marland Kitchen NEEDLE, DISP, 23 G (B-D DISP NEEDLE 23GX1-1/4") 23G X 1-1/4" MISC After drawing up sumatriptan solution, change to this needle for injection. 12 each 5  . omeprazole  (PRILOSEC) 20 MG capsule     . oxyCODONE-acetaminophen (PERCOCET) 7.5-325 MG tablet Take 1 tablet by mouth every 6 (six) hours as needed. 28 tablet 0  . OXYGEN Inhale into the lungs. 2L per min    . polyethylene glycol (MIRALAX / GLYCOLAX) packet Take 17 g by mouth as needed.    . potassium chloride (K-DUR) 10 MEQ tablet Take 10 mEq by mouth.    . promethazine (PHENERGAN) 25 MG/ML injection Inject 25 mg into the muscle.    . pseudoephedrine-acetaminophen (TYLENOL SINUS) 30-500 MG TABS Take 2 tablets by mouth every 4 (four) hours as needed. Sinus pressure    . ranitidine (ZANTAC) 150 MG tablet Take 150 mg by mouth at bedtime.    . rizatriptan (MAXALT-MLT) 10 MG disintegrating tablet Take 1 tablet (10 mg total) by mouth as needed. May repeat in 2 hours if needed 15 tablet 6  . SUMAtriptan (IMITREX) 6 MG/0.5ML SOLN injection INJECT AT ONSET OF MIGRAINE. MAY REPEAT IN 2  HOURS IF PAIN PERSISTS** MAX:2 DOSES/24 HR 12 vial 0  . SYRINGE-NEEDLE, DISP, 3 ML (B-D 3CC LUER-LOK SYR 23GX1") 23G X 1" 3 ML MISC USE TO DRAW UP AND INJECT SUMATRIPTAN SOLUTION 12 each 4   Current Facility-Administered Medications on File Prior to Visit  Medication Dose Route Frequency Provider Last Rate Last Dose  . valproate (DEPACON) 1,000 mg in sodium chloride 0.9 % 100 mL IVPB  1,000 mg Intravenous Continuous Levert Feinstein, MD   Stopped at 03/18/13 0850    ALLERGIES: Allergies  Allergen Reactions  . Ciprofloxacin Hives    REACTION: Hives  . Diphenhydramine     Other reaction(s): Hallucinations  . Diphenhydramine Hcl Other (See Comments)  . Diphenhydramine Hcl     Other reaction(s): DELIRIUM  . Divalproex Sodium Swelling    Other reaction(s): Other swelling  . Heparin Other (See Comments)    Other reaction(s): HYPERTENSION,SWELLING Raises BP and swells  . Ketorolac     Other reaction(s): URINARY RETENTION  . Ketorolac Tromethamine Other (See Comments)    Bladder Retention Bladder retention Bladder retintion  .  Nefazodone   . Other     Other reaction(s): Other (See Comments) Per [facility] Other reaction(s): Agitation  . Sertraline   . Tramadol Other (See Comments)    Bladder retention  . Trazodone   . Trazodone And Nefazodone     Other reaction(s): Other hyperactive  Not agree with herTourette's   . Valproic Acid Swelling    Other reaction(s): SWELLING Other reaction(s): Facial Edema (intolerance)    FAMILY HISTORY: Family History  Problem Relation Age of Onset  . Heart disease Unknown        No family history   SOCIAL HISTORY: Social History   Socioeconomic History  . Marital status: Legally Separated    Spouse name: Not on file  . Number of children: 2  . Years of education: Not on file  . Highest education level: Not on file  Occupational History    Comment: Unemployed  Social Needs  . Financial resource strain: Not on file  . Food insecurity:    Worry: Not on file    Inability: Not on file  . Transportation needs:    Medical: Not on file    Non-medical: Not on file  Tobacco Use  . Smoking status: Current Every Day Smoker    Packs/day: 0.01    Types: Cigarettes  . Smokeless tobacco: Never Used  Substance and Sexual Activity  . Alcohol use: Yes    Alcohol/week: 0.0 standard drinks    Comment: occasionally  . Drug use: No    Types: Marijuana    Comment: Previous use of marijuana and cocaine  . Sexual activity: Not on file  Lifestyle  . Physical activity:    Days per week: Not on file    Minutes per session: Not on file  . Stress: Not on file  Relationships  . Social connections:    Talks on phone: Not on file    Gets together: Not on file    Attends religious service: Not on file    Active member of club or organization: Not on file    Attends meetings of clubs or organizations: Not on file    Relationship status: Not on file  . Intimate partner violence:    Fear of current or ex partner: Not on file    Emotionally abused: Not on file    Physically  abused: Not on file    Forced sexual  activity: Not on file  Other Topics Concern  . Not on file  Social History Narrative   Patient is unemployed. Graduated from equivalency degree.   Divorced.    REVIEW OF SYSTEMS: Constitutional: No fevers, chills, or sweats, no generalized fatigue, change in appetite Eyes: No visual changes, double vision, eye pain Ear, nose and throat: No hearing loss, ear pain, nasal congestion, sore throat Cardiovascular: No chest pain, palpitations Respiratory:  No shortness of breath at rest or with exertion, wheezes GastrointestinaI: No nausea, vomiting, diarrhea, abdominal pain, fecal incontinence Genitourinary:  No dysuria, urinary retention or frequency Musculoskeletal:  No neck pain, back pain Integumentary: No rash, pruritus, skin lesions Neurological: as above Psychiatric: No depression, insomnia, anxiety Endocrine: No palpitations, fatigue, diaphoresis, mood swings, change in appetite, change in weight, increased thirst Hematologic/Lymphatic:  No purpura, petechiae. Allergic/Immunologic: no itchy/runny eyes, nasal congestion, recent allergic reactions, rashes  PHYSICAL EXAM: Blood pressure 106/74, pulse 63, height 5' (1.524 m), weight 211 lb (95.7 kg), last menstrual period 05/29/2010, SpO2 97 %. General: No acute distress.  Patient appears well-groomed.  Head:  Normocephalic/atraumatic Eyes:  fundi examined but not visualized Neck: supple, no paraspinal tenderness, full range of motion Back: No paraspinal tenderness Heart: regular rate and rhythm Lungs: Clear to auscultation bilaterally. Vascular: No carotid bruits. Neurological Exam: Mental status: alert and oriented to person, place, and time, recent and remote memory intact, fund of knowledge intact, attention and concentration intact, speech fluent and not dysarthric, language intact. Cranial nerves: CN I: not tested CN II: pupils equal, round and reactive to light, visual fields intact CN  III, IV, VI:  full range of motion, no nystagmus, no ptosis CN V: facial sensation intact CN VII: upper and lower face symmetric CN VIII: hearing intact CN IX, X: gag intact, uvula midline CN XI: sternocleidomastoid and trapezius muscles intact CN XII: tongue midline Bulk & Tone: normal, no fasciculations. Motor:  5/5 throughout  Sensation: temperature and vibration sensation intact. Deep Tendon Reflexes:  2+ throughout, toes downgoing.  Finger to nose testing:  Without dysmetria.  Heel to shin:  Without dysmetria.  Gait:  Normal station and stride.  Romberg negative.  IMPRESSION: Chronic migraine without aura, without status migrainosus, intractable  PLAN: 1.  For preventative management, we will start Emgality 2.  For abortive therapy, she will continue sumatriptan 6mg  [place] (or rizatriptan).  Will look into alternative options that would be pre-authorized by Medicaid 3.  Limit use of pain relievers to no more than 2 days out of week to prevent risk of rebound or medication-overuse headache. 4.  Keep headache diary 5.  Exercise, hydration, caffeine cessation, sleep hygiene, monitor for and avoid triggers 6.  Consider:  magnesium citrate 400mg  daily, riboflavin 400mg  daily, and coenzyme Q10 100mg  three times daily  Tobacco cessation counseling (CPT 99406):  Tobacco with no history of CAD, stroke, or cancer  - Currently smoking 1 to 2 cigarettes/day   - Patient was informed of the dangers of tobacco abuse including stroke, cancer, and MI, as well as benefits of tobacco cessation. - Patient is willing to quit at this time. - Approximately 4 mins were spent counseling patient cessation techniques. We discussed various methods to help quit smoking, including deciding on a date to quit, joining a support group, pharmacological agents- nicotine gum/patch/lozenges, chantix.  7. - I will reassess her progress at the next follow-up visit 8.  Follow up in 3 to 4 months.   Thank you for  allowing me to take  part in the care of this patient.  Shon Millet, DO  CC:  Jackie Plum, MD  Norva Riffle, PA

## 2018-01-20 ENCOUNTER — Ambulatory Visit: Payer: Medicaid Other | Admitting: Neurology

## 2018-01-20 ENCOUNTER — Encounter: Payer: Self-pay | Admitting: Neurology

## 2018-01-20 VITALS — BP 106/74 | HR 63 | Ht 60.0 in | Wt 211.0 lb

## 2018-01-20 DIAGNOSIS — G43719 Chronic migraine without aura, intractable, without status migrainosus: Secondary | ICD-10-CM

## 2018-01-20 MED ORDER — GALCANEZUMAB-GNLM 120 MG/ML ~~LOC~~ SOSY: 120.0000 mg | PREFILLED_SYRINGE | Freq: Once | SUBCUTANEOUS | 0 refills | Status: DC

## 2018-01-20 NOTE — Patient Instructions (Signed)
Migraine Recommendations: 1.  We will start Emgality, one of the new monthly self-injections 2.  Continue sumatriptan shot or rizatriptan.  Will look into what other options are available. 3.  Limit use of pain relievers to no more than 2 days out of the week.  These medications include acetaminophen, ibuprofen, triptans and narcotics.  This will help reduce risk of rebound headaches. 4.  Be aware of common food triggers such as processed sweets, processed foods with nitrites (such as deli meat, hot dogs, sausages), foods with MSG, alcohol (such as wine), chocolate, certain cheeses, certain fruits (dried fruits, bananas, some citrus fruit), vinegar, diet soda. 4.  Avoid caffeine 5.  Routine exercise 6.  Proper sleep hygiene 7.  Stay adequately hydrated with water 8.  Keep a headache diary. 9.  Maintain proper stress management. 10.  Do not skip meals. 11.  Consider supplements:  Magnesium citrate 400mg  to 600mg  daily, riboflavin 400mg , Coenzyme Q 10 100mg  three times daily 12.  Follow up in 3 to 4 months.

## 2018-02-03 ENCOUNTER — Telehealth: Payer: Self-pay | Admitting: Neurology

## 2018-02-03 NOTE — Telephone Encounter (Signed)
Called and spoke with Pt. She does not have sumatriptan or rizatriptan. She said a powder was discussed as well as a nasal spray. She no longer takes any pain meds and she really needs something for her headaches. Pt asked that I call her as soon as I have an answer in the morning.

## 2018-02-03 NOTE — Telephone Encounter (Signed)
Patient states that she has a Migraine coming on and she wants to talk to someone about what she can do. She feels like it is going to make her sick please call

## 2018-02-04 NOTE — Telephone Encounter (Signed)
She has Medicaid, so I am unsure what we can get covered.  She has intractable migraines that can last several days, even with sumatriptan injections.  What is the possibility of getting MIgranal, Cambia or Sprix covered?

## 2018-02-04 NOTE — Telephone Encounter (Signed)
Called and spoke with Pt, advised her we have samples of: Cambia lot# [id number] x 11 2019 3 samples Migranal lot# [id number] x July 2021 4 samples  Will check with Medicaid as to which may be covered

## 2018-02-24 ENCOUNTER — Ambulatory Visit: Payer: Self-pay | Admitting: Neurology

## 2018-02-24 ENCOUNTER — Encounter

## 2018-03-10 ENCOUNTER — Other Ambulatory Visit: Payer: Self-pay | Admitting: Neurology

## 2018-04-23 DEATH — deceased

## 2018-05-20 NOTE — Progress Notes (Deleted)
NEUROLOGY FOLLOW UP OFFICE NOTE  Ulyses AmorFaye M Mailhot [id number]  HISTORY OF PRESENT ILLNESS: Renee HarderFaye Saliba is a 48 year old right-handed female with COPD, IBS, depression, Tourette's, peripheral arterial disease, and history of kidney stones and substance abuse who follows up for migraine.  UPDATE: For alternative abortive therapy, we suggested Cambia or Migranal.  ***  Intensity:  *** Duration:  *** Frequency:  *** Frequency of abortive medication: *** Current NSAIDS:  no Current analgesics:  Tylenol Sinus Current triptans:  Sumatriptan 6mg  [place] (causes head to burn, sometimes helps), Maxalt MLT 10mg  Current ergotamine:  no Current anti-emetic:  Promethazine 25mg  Current muscle relaxants:  Flexeril (dry mouth) Current anti-anxiolytic:  Diazepam (for muscle spasms) Current sleep aide:  none Current Antihypertensive medications:  Amlodipine 10mg , HCTZ Current Antidepressant medications:  Prozac 40mg  Current Anticonvulsant medications:  Gabapentin 300mg  twice times daly Current anti-CGRP:   Emgality Current Vitamins/Herbal/Supplements:  no Current Antihistamines/Decongestants:  no Other therapy:  She was being treated by pain management for her neck but stopped because the practice closed. Other medication:  Lithium carbonate 300mg , Synthroid  Caffeine:  1/2 cup coffee daily Alcohol:  no Smoker:  Yes (1-2 cigarettes a day) Diet:  Drinks water all day long.  No soda Exercise:  Not routine Depression:  yes; Anxiety:  yes Other pain:  Neck pain Sleep hygiene:  Not optimal.but overall okay.  HISTORY:  Onset: 48 years old Location:  Mostly right sided, shoots from back of neck up to frontal region Quality:  pounding Initial intensity:  Severe.  She denies new headache, thunderclap headache or severe headache that wakes her from sleep. Aura:  no Prodrome:  no Postdrome:  no Associated symptoms: Nausea, vomiting, photophobia, phonophobia, osmophobia.  She denies associated  unilateral numbness or weakness. Initial duration:  4 to 5 days Initial Frequency:  daily Initial Frequency of abortive medication: several days a week Triggers: Neck pain (Cervical DDD) Relieving factors:  no Activity:  aggravates  Past NSAIDS:  Ibuprofen, naproxen Past analgesics:  Excedrin Migraine, Stadol Past abortive triptans: no Past abortive ergotamine:  no Past muscle relaxants:  Baclofen, Flexeril, Robaxin, Zanflex Past anti-emetic:  no Past antihypertensive medications:  Propranolol ER 60mg  (hypotension) Past antidepressant medications:  Nortriptyline 50mg  Past anticonvulsant medications:  Topiramate, Depakote, Lyrica Past anti-CGRP:  no Past vitamins/Herbal/Supplements:  no Past antihistamines/decongestants:  no Other past therapies:  Botox (ineffective)  Family history of migraines:  Mom, sisters  12/31/10 CT Head w/o personally reviewed which was limited due to motion artifact but overall unremarkable.  PAST MEDICAL HISTORY: Past Medical History:  Diagnosis Date  . ACID REFLUX DISEASE   . ALCOHOL ABUSE, HX OF   . ANEMIA OF CHRONIC DISEASE   . Asthma   . BACK PAIN   . BLADDER SUSPENSION, HX OF   . 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 CANDIDIASIS   . COPD (chronic obstructive pulmonary disease) (HCC)   . Coronary artery disease   . DEGENERATIVE DISC DISEASE   . DEPRESSION   . Diabetes mellitus   . DRUG ABUSE, HX OF   . HEMORRHOIDS, INTERNAL   . INSOMNIA, CHRONIC   . Irritable bowel syndrome   . MENOPAUSE, SURGICAL   . Migraine   . Nephrolithiasis   . PERSONAL HX OF METHICILLIN RESIST STAPH AUREUS   . PNEUMONIA, COMMUNITY ACQUIRED, PNEUMOCOCCAL   . PUD (peptic ulcer disease)   . Renal colic   . Renal insufficiency   . Tourette's disorder     MEDICATIONS: Current Outpatient Medications  on File Prior to Visit  Medication Sig Dispense Refill  . albuterol (PROVENTIL HFA;VENTOLIN HFA) 108 (90 BASE) MCG/ACT inhaler Inhale 2 puffs into the lungs every 6  (six) hours as needed. For shortness breath    . albuterol (PROVENTIL) (2.5 MG/3ML) 0.083% nebulizer solution 2.5 mg.    . amLODipine (NORVASC) 10 MG tablet Take 10 mg by mouth.    . Biotin 1 MG CAPS Take 10,000 mg by mouth.    . butorphanol (STADOL) 10 MG/ML nasal spray Place 1 spray into the nose every 6 (six) hours as needed for headache. (Patient not taking: Reported on 01/20/2018) 2.5 mL 1  . cholecalciferol (VITAMIN D) 1000 UNITS tablet Take 400 Units by mouth.    . diazepam (VALIUM) 5 MG tablet Take 5 mg by mouth 3 (three) times daily.    Marland Kitchen. diltiazem (CARDIZEM CD) 120 MG 24 hr capsule Take 1 capsule (120 mg total) by mouth daily. (Patient not taking: Reported on 01/20/2018) 30 capsule 11  . docusate sodium (COLACE) 100 MG capsule Take 200 mg by mouth.    . EMGALITY 120 MG/ML SOAJ INJECT 120MG  INTO THE SKIN ONCE FOR ONE DOSE 1 pen 5  . esomeprazole (NEXIUM) 40 MG capsule Take 40 mg by mouth 2 (two) times daily.    . ferrous fumarate (HEMOCYTE - 106 MG FE) 325 (106 FE) MG TABS Take 2 tablets by mouth daily.     . ferrous sulfate 325 (65 FE) MG tablet Take 1 tablet by mouth.    Marland Kitchen. FLUoxetine (PROZAC) 40 MG capsule Take 1 capsule (40 mg total) by mouth daily. 90 capsule 3  . gabapentin (NEURONTIN) 300 MG capsule Take 300 mg by mouth 4 (four) times daily.     . hydrochlorothiazide (HYDRODIURIL) 25 MG tablet 1 mg.    . iron polysaccharides (NU-IRON) 150 MG capsule Frequency:   Dosage:0   MG  Instructions:  Note:TAKE 1 CAPSULE BY MOUTH TWICE DAILY    . levothyroxine (SYNTHROID, LEVOTHROID) 25 MCG tablet Take 25 mcg by mouth.    . lithium carbonate 300 MG capsule     . loperamide (IMODIUM) 2 MG capsule Take 2 mg by mouth 4 (four) times daily as needed. For diarrhea    . Multiple Vitamins-Minerals (THERA-M) TABS Take 1 tablet by mouth.    Marland Kitchen. NEEDLE, DISP, 23 G (B-D DISP NEEDLE 23GX1-1/4") 23G X 1-1/4" MISC After drawing up sumatriptan solution, change to this needle for injection. 12 each 5  .  omeprazole (PRILOSEC) 20 MG capsule     . oxyCODONE-acetaminophen (PERCOCET) 7.5-325 MG tablet Take 1 tablet by mouth every 6 (six) hours as needed. (Patient not taking: Reported on 01/20/2018) 28 tablet 0  . OXYGEN Inhale into the lungs. 2L per min    . polyethylene glycol (MIRALAX / GLYCOLAX) packet Take 17 g by mouth as needed.    . potassium chloride (K-DUR) 10 MEQ tablet Take 10 mEq by mouth.    . promethazine (PHENERGAN) 25 MG/ML injection Inject 25 mg into the muscle.    . pseudoephedrine-acetaminophen (TYLENOL SINUS) 30-500 MG TABS Take 2 tablets by mouth every 4 (four) hours as needed. Sinus pressure    . ranitidine (ZANTAC) 150 MG tablet Take 150 mg by mouth at bedtime.    . rizatriptan (MAXALT-MLT) 10 MG disintegrating tablet Take 1 tablet (10 mg total) by mouth as needed. May repeat in 2 hours if needed 15 tablet 6  . SUMAtriptan (IMITREX) 6 MG/0.5ML SOLN injection INJECT AT ONSET  OF MIGRAINE. MAY REPEAT IN 2 HOURS IF PAIN PERSISTS** MAX:2 DOSES/24 HR 12 vial 0  . SYRINGE-NEEDLE, DISP, 3 ML (B-D 3CC LUER-LOK SYR 23GX1") 23G X 1" 3 ML MISC USE TO DRAW UP AND INJECT SUMATRIPTAN SOLUTION 12 each 4   Current Facility-Administered Medications on File Prior to Visit  Medication Dose Route Frequency Provider Last Rate Last Dose  . valproate (DEPACON) 1,000 mg in sodium chloride 0.9 % 100 mL IVPB  1,000 mg Intravenous Continuous Levert Feinstein, MD   Stopped at 03/18/13 0850    ALLERGIES: Allergies  Allergen Reactions  . Ciprofloxacin Hives    REACTION: Hives  . Diphenhydramine     Other reaction(s): Hallucinations  . Diphenhydramine Hcl Other (See Comments)  . Diphenhydramine Hcl     Other reaction(s): DELIRIUM  . Divalproex Sodium Swelling    Other reaction(s): Other swelling  . Heparin Other (See Comments)    Other reaction(s): HYPERTENSION,SWELLING Raises BP and swells  . Ketorolac     Other reaction(s): URINARY RETENTION  . Ketorolac Tromethamine Other (See Comments)    Bladder  Retention Bladder retention Bladder retintion  . Nefazodone   . Other     Other reaction(s): Other (See Comments) Per [facility] Other reaction(s): Agitation  . Sertraline   . Tramadol Other (See Comments)    Bladder retention  . Trazodone   . Trazodone And Nefazodone     Other reaction(s): Other hyperactive  Not agree with herTourette's   . Valproic Acid Swelling    Other reaction(s): SWELLING Other reaction(s): Facial Edema (intolerance)    FAMILY HISTORY: Family History  Problem Relation Age of Onset  . Heart disease Unknown        No family history   SOCIAL HISTORY: Social History   Socioeconomic History  . Marital status: Legally Separated    Spouse name: Not on file  . Number of children: 2  . Years of education: Not on file  . Highest education level: Not on file  Occupational History  . Occupation: disabled    Comment: Unemployed  Social Needs  . Financial resource strain: Not on file  . Food insecurity:    Worry: Not on file    Inability: Not on file  . Transportation needs:    Medical: Not on file    Non-medical: Not on file  Tobacco Use  . Smoking status: Current Every Day Smoker    Packs/day: 0.01    Types: Cigarettes  . Smokeless tobacco: Never Used  Substance and Sexual Activity  . Alcohol use: Yes    Alcohol/week: 0.0 standard drinks    Comment: occasionally  . Drug use: No    Types: Marijuana    Comment: Previous use of marijuana and cocaine  . Sexual activity: Not on file  Lifestyle  . Physical activity:    Days per week: Not on file    Minutes per session: Not on file  . Stress: Not on file  Relationships  . Social connections:    Talks on phone: Not on file    Gets together: Not on file    Attends religious service: Not on file    Active member of club or organization: Not on file    Attends meetings of clubs or organizations: Not on file    Relationship status: Not on file  . Intimate partner violence:    Fear of current or  ex partner: Not on file    Emotionally abused: Not on file  Physically abused: Not on file    Forced sexual activity: Not on file  Other Topics Concern  . Not on file  Social History Narrative   Patient is unemployed. Graduated from equivalency degree.   Divorced.      Patient is right handed. She drinks one cup of coffee a day.    REVIEW OF SYSTEMS: Constitutional: No fevers, chills, or sweats, no generalized fatigue, change in appetite Eyes: No visual changes, double vision, eye pain Ear, nose and throat: No hearing loss, ear pain, nasal congestion, sore throat Cardiovascular: No chest pain, palpitations Respiratory:  No shortness of breath at rest or with exertion, wheezes GastrointestinaI: No nausea, vomiting, diarrhea, abdominal pain, fecal incontinence Genitourinary:  No dysuria, urinary retention or frequency Musculoskeletal:  No neck pain, back pain Integumentary: No rash, pruritus, skin lesions Neurological: as above Psychiatric: No depression, insomnia, anxiety Endocrine: No palpitations, fatigue, diaphoresis, mood swings, change in appetite, change in weight, increased thirst Hematologic/Lymphatic:  No purpura, petechiae. Allergic/Immunologic: no itchy/runny eyes, nasal congestion, recent allergic reactions, rashes  PHYSICAL EXAM: *** General: No acute distress.  Patient appears ***-groomed.  *** body habitus. Head:  Normocephalic/atraumatic Eyes:  Fundi examined but not visualized Neck: supple, no paraspinal tenderness, full range of motion Heart:  Regular rate and rhythm Lungs:  Clear to auscultation bilaterally Back: No paraspinal tenderness Neurological Exam: alert and oriented to person, place, and time. Attention span and concentration intact, recent and remote memory intact, fund of knowledge intact.  Speech fluent and not dysarthric, language intact.  CN II-XII intact. Bulk and tone normal, muscle strength 5/5 throughout.  Sensation to light touch,  temperature and vibration intact.  Deep tendon reflexes 2+ throughout, toes downgoing.  Finger to nose and heel to shin testing intact.  Gait normal, Romberg negative.  IMPRESSION: *** migraine without aura, without status migrainosus, *** intractable  PLAN: 1.  For preventative management, *** 2.  For abortive therapy, *** 3.  Limit use of pain relievers to no more than 2 days out of week to prevent risk of rebound or medication-overuse headache. 4.  Keep headache diary 5.  Exercise, hydration, caffeine cessation, sleep hygiene, monitor for and avoid triggers 6.  Consider:  magnesium citrate 400mg  daily, riboflavin 400mg  daily, and coenzyme Q10 100mg  three times daily 7.  Follow up ***  Shon Millet, DO  CC: Jackie Plum, MD

## 2018-05-21 ENCOUNTER — Ambulatory Visit: Payer: Medicaid Other | Admitting: Neurology
